# Patient Record
Sex: Female | Born: 1960
Health system: Southern US, Community
[De-identification: ages and names within clinical notes are randomized; demographics above are authoritative.]

## PROBLEM LIST (undated history)

## (undated) DIAGNOSIS — T4145XA Adverse effect of unspecified anesthetic, initial encounter: Secondary | ICD-10-CM

## (undated) DIAGNOSIS — T8859XA Other complications of anesthesia, initial encounter: Secondary | ICD-10-CM

## (undated) DIAGNOSIS — K222 Esophageal obstruction: Secondary | ICD-10-CM

## (undated) DIAGNOSIS — K429 Umbilical hernia without obstruction or gangrene: Secondary | ICD-10-CM

## (undated) DIAGNOSIS — N938 Other specified abnormal uterine and vaginal bleeding: Secondary | ICD-10-CM

## (undated) DIAGNOSIS — Z87440 Personal history of urinary (tract) infections: Secondary | ICD-10-CM

## (undated) DIAGNOSIS — K635 Polyp of colon: Secondary | ICD-10-CM

## (undated) DIAGNOSIS — G4733 Obstructive sleep apnea (adult) (pediatric): Secondary | ICD-10-CM

## (undated) DIAGNOSIS — O039 Complete or unspecified spontaneous abortion without complication: Secondary | ICD-10-CM

## (undated) DIAGNOSIS — M810 Age-related osteoporosis without current pathological fracture: Secondary | ICD-10-CM

## (undated) DIAGNOSIS — R000 Tachycardia, unspecified: Secondary | ICD-10-CM

## (undated) DIAGNOSIS — Q283 Other malformations of cerebral vessels: Secondary | ICD-10-CM

## (undated) DIAGNOSIS — D1800 Hemangioma unspecified site: Secondary | ICD-10-CM

## (undated) DIAGNOSIS — N840 Polyp of corpus uteri: Secondary | ICD-10-CM

## (undated) DIAGNOSIS — K5900 Constipation, unspecified: Secondary | ICD-10-CM

## (undated) DIAGNOSIS — Z9989 Dependence on other enabling machines and devices: Secondary | ICD-10-CM

## (undated) DIAGNOSIS — R609 Edema, unspecified: Secondary | ICD-10-CM

## (undated) DIAGNOSIS — M461 Sacroiliitis, not elsewhere classified: Secondary | ICD-10-CM

## (undated) DIAGNOSIS — J302 Other seasonal allergic rhinitis: Secondary | ICD-10-CM

## (undated) DIAGNOSIS — C439 Malignant melanoma of skin, unspecified: Secondary | ICD-10-CM

## (undated) DIAGNOSIS — G43829 Menstrual migraine, not intractable, without status migrainosus: Secondary | ICD-10-CM

## (undated) DIAGNOSIS — M25473 Effusion, unspecified ankle: Secondary | ICD-10-CM

## (undated) DIAGNOSIS — K219 Gastro-esophageal reflux disease without esophagitis: Secondary | ICD-10-CM

## (undated) HISTORY — DX: Other specified abnormal uterine and vaginal bleeding: N93.8

## (undated) HISTORY — PX: HERNIA REPAIR: SHX51

## (undated) HISTORY — DX: Polyp of colon: K63.5

## (undated) HISTORY — DX: Polyp of corpus uteri: N84.0

## (undated) HISTORY — DX: Hemangioma unspecified site: D18.00

## (undated) HISTORY — PX: OTHER SURGICAL HISTORY: SHX169

## (undated) HISTORY — DX: Personal history of urinary (tract) infections: Z87.440

## (undated) HISTORY — PX: EXPLORATORY LAPAROTOMY: SUR591

## (undated) HISTORY — DX: Umbilical hernia without obstruction or gangrene: K42.9

## (undated) HISTORY — DX: Dependence on other enabling machines and devices: Z99.89

## (undated) HISTORY — PX: TONSILLECTOMY: SUR1361

## (undated) HISTORY — PX: DILATION AND CURETTAGE OF UTERUS: SHX78

## (undated) HISTORY — DX: Esophageal obstruction: K22.2

## (undated) HISTORY — DX: Age-related osteoporosis without current pathological fracture: M81.0

## (undated) HISTORY — DX: Edema, unspecified: R60.9

## (undated) HISTORY — DX: Menstrual migraine, not intractable, without status migrainosus: G43.829

## (undated) HISTORY — DX: Obstructive sleep apnea (adult) (pediatric): G47.33

## (undated) HISTORY — DX: Malignant melanoma of skin, unspecified: C43.9

## (undated) HISTORY — DX: Other malformations of cerebral vessels: Q28.3

## (undated) HISTORY — DX: Constipation, unspecified: K59.00

## (undated) HISTORY — DX: Tachycardia, unspecified: R00.0

## (undated) HISTORY — DX: Other seasonal allergic rhinitis: J30.2

---

## 1997-07-11 ENCOUNTER — Ambulatory Visit (HOSPITAL_COMMUNITY): Admission: RE | Admit: 1997-07-11 | Discharge: 1997-07-11 | Payer: Self-pay | Admitting: Obstetrics and Gynecology

## 1997-12-06 ENCOUNTER — Inpatient Hospital Stay (HOSPITAL_COMMUNITY): Admission: AD | Admit: 1997-12-06 | Discharge: 1997-12-08 | Payer: Self-pay | Admitting: Obstetrics and Gynecology

## 1998-06-02 ENCOUNTER — Other Ambulatory Visit: Admission: RE | Admit: 1998-06-02 | Discharge: 1998-06-02 | Payer: Self-pay | Admitting: Obstetrics and Gynecology

## 1999-01-25 ENCOUNTER — Ambulatory Visit (HOSPITAL_COMMUNITY): Admission: RE | Admit: 1999-01-25 | Discharge: 1999-01-25 | Payer: Self-pay | Admitting: Gastroenterology

## 1999-01-25 ENCOUNTER — Encounter: Payer: Self-pay | Admitting: Gastroenterology

## 1999-06-26 ENCOUNTER — Other Ambulatory Visit: Admission: RE | Admit: 1999-06-26 | Discharge: 1999-06-26 | Payer: Self-pay | Admitting: Obstetrics and Gynecology

## 2000-09-29 ENCOUNTER — Other Ambulatory Visit: Admission: RE | Admit: 2000-09-29 | Discharge: 2000-09-29 | Payer: Self-pay | Admitting: Obstetrics and Gynecology

## 2001-10-26 ENCOUNTER — Encounter (INDEPENDENT_AMBULATORY_CARE_PROVIDER_SITE_OTHER): Payer: Self-pay | Admitting: *Deleted

## 2001-10-26 ENCOUNTER — Ambulatory Visit (HOSPITAL_COMMUNITY): Admission: RE | Admit: 2001-10-26 | Discharge: 2001-10-26 | Payer: Self-pay | Admitting: Gastroenterology

## 2001-11-18 ENCOUNTER — Other Ambulatory Visit: Admission: RE | Admit: 2001-11-18 | Discharge: 2001-11-18 | Payer: Self-pay | Admitting: Obstetrics and Gynecology

## 2001-12-21 ENCOUNTER — Encounter: Payer: Self-pay | Admitting: Obstetrics and Gynecology

## 2001-12-21 ENCOUNTER — Ambulatory Visit (HOSPITAL_COMMUNITY): Admission: RE | Admit: 2001-12-21 | Discharge: 2001-12-21 | Payer: Self-pay | Admitting: Obstetrics and Gynecology

## 2002-04-20 ENCOUNTER — Emergency Department (HOSPITAL_COMMUNITY): Admission: EM | Admit: 2002-04-20 | Discharge: 2002-04-20 | Payer: Self-pay | Admitting: Emergency Medicine

## 2002-04-20 ENCOUNTER — Encounter: Payer: Self-pay | Admitting: Emergency Medicine

## 2003-01-06 ENCOUNTER — Other Ambulatory Visit: Admission: RE | Admit: 2003-01-06 | Discharge: 2003-01-06 | Payer: Self-pay | Admitting: Obstetrics and Gynecology

## 2003-06-29 ENCOUNTER — Ambulatory Visit (HOSPITAL_COMMUNITY): Admission: RE | Admit: 2003-06-29 | Discharge: 2003-06-29 | Payer: Self-pay | Admitting: Obstetrics and Gynecology

## 2003-08-24 ENCOUNTER — Ambulatory Visit (HOSPITAL_COMMUNITY): Admission: RE | Admit: 2003-08-24 | Discharge: 2003-08-24 | Payer: Self-pay | Admitting: Orthopedic Surgery

## 2004-02-19 DIAGNOSIS — C439 Malignant melanoma of skin, unspecified: Secondary | ICD-10-CM

## 2004-02-19 HISTORY — PX: MELANOMA EXCISION: SHX5266

## 2004-02-19 HISTORY — DX: Malignant melanoma of skin, unspecified: C43.9

## 2004-02-23 ENCOUNTER — Other Ambulatory Visit: Admission: RE | Admit: 2004-02-23 | Discharge: 2004-02-23 | Payer: Self-pay | Admitting: Obstetrics and Gynecology

## 2004-11-22 ENCOUNTER — Ambulatory Visit: Payer: Self-pay | Admitting: Cardiology

## 2005-02-15 ENCOUNTER — Ambulatory Visit (HOSPITAL_COMMUNITY): Admission: RE | Admit: 2005-02-15 | Discharge: 2005-02-15 | Payer: Self-pay | Admitting: Gastroenterology

## 2005-06-19 DIAGNOSIS — Z87898 Personal history of other specified conditions: Secondary | ICD-10-CM | POA: Insufficient documentation

## 2005-09-19 ENCOUNTER — Other Ambulatory Visit: Admission: RE | Admit: 2005-09-19 | Discharge: 2005-09-19 | Payer: Self-pay | Admitting: Obstetrics and Gynecology

## 2005-11-01 ENCOUNTER — Ambulatory Visit (HOSPITAL_COMMUNITY): Admission: RE | Admit: 2005-11-01 | Discharge: 2005-11-01 | Payer: Self-pay | Admitting: Obstetrics and Gynecology

## 2006-04-30 ENCOUNTER — Emergency Department (HOSPITAL_COMMUNITY): Admission: EM | Admit: 2006-04-30 | Discharge: 2006-04-30 | Payer: Self-pay | Admitting: Family Medicine

## 2006-12-08 ENCOUNTER — Ambulatory Visit (HOSPITAL_COMMUNITY): Admission: RE | Admit: 2006-12-08 | Discharge: 2006-12-08 | Payer: Self-pay | Admitting: Obstetrics and Gynecology

## 2007-02-26 ENCOUNTER — Ambulatory Visit (HOSPITAL_BASED_OUTPATIENT_CLINIC_OR_DEPARTMENT_OTHER): Admission: RE | Admit: 2007-02-26 | Discharge: 2007-02-26 | Payer: Self-pay | Admitting: Surgery

## 2007-02-26 ENCOUNTER — Encounter (INDEPENDENT_AMBULATORY_CARE_PROVIDER_SITE_OTHER): Payer: Self-pay | Admitting: Surgery

## 2008-01-04 ENCOUNTER — Ambulatory Visit (HOSPITAL_COMMUNITY): Admission: RE | Admit: 2008-01-04 | Discharge: 2008-01-04 | Payer: Self-pay | Admitting: Obstetrics and Gynecology

## 2008-12-31 ENCOUNTER — Ambulatory Visit: Payer: Self-pay | Admitting: Family Medicine

## 2008-12-31 DIAGNOSIS — J069 Acute upper respiratory infection, unspecified: Secondary | ICD-10-CM | POA: Insufficient documentation

## 2009-02-27 ENCOUNTER — Ambulatory Visit (HOSPITAL_COMMUNITY): Admission: RE | Admit: 2009-02-27 | Discharge: 2009-02-27 | Payer: Self-pay | Admitting: Obstetrics and Gynecology

## 2009-05-24 ENCOUNTER — Ambulatory Visit (HOSPITAL_COMMUNITY): Admission: RE | Admit: 2009-05-24 | Discharge: 2009-05-24 | Payer: Self-pay | Admitting: Gastroenterology

## 2010-01-23 ENCOUNTER — Ambulatory Visit (HOSPITAL_COMMUNITY)
Admission: RE | Admit: 2010-01-23 | Discharge: 2010-01-23 | Payer: Self-pay | Source: Home / Self Care | Admitting: Gastroenterology

## 2010-02-18 HISTORY — PX: COLONOSCOPY: SHX174

## 2010-07-03 NOTE — Op Note (Signed)
NAMECHANA, Sharp               ACCOUNT NO.:  [id number]   MEDICAL RECORD NO.:  [id number]          PATIENT TYPE:  AMB   LOCATION:  [facility]                         FACILITY:  [facility]   PHYSICIAN:  Sandria Bales. Ezzard Standing, M.D.  DATE OF BIRTH:  05/29/1960   DATE OF PROCEDURE:  02/26/2007  DATE OF DISCHARGE:                               OPERATIVE REPORT   PREOPERATIVE DIAGNOSIS:  Left inguinal hernia.   POSTOPERATIVE DIAGNOSIS:  Left indirect inguinal hernia.   PROCEDURE:  Open left inguinal hernia repair with mesh.   SURGEON:  Sandria Bales. Ezzard Standing, M.D.   ASSISTANT:  None.   ANESTHESIA:  General endotracheal with 30 mL of 0.25% Marcaine.   COMPLICATIONS:  None.   INDICATIONS FOR PROCEDURE:  The patient is a 50 year old white female  who is a patient of Dr. 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  She is now interested in  having this hernia repaired.   I discussed with her the indications and potential complications of  hernia repair. Potential complications include but are not limited to  bleeding, infection, nerve injury, and recurrence of the hernia.   DESCRIPTION OF PROCEDURE:  I had marked the patient preoperatively.  The  patient had a general endotracheal anesthetic and was  a timeout was  held identifying the patient and the procedure.   She was given 1 gram of Ancef at the initiation of the procedure.  Her  left groin was shaved, prepped with Betadine solution, and sterilely  draped.   I made an incision in the left groin cutting down to the external  oblique fascia which I opened.  She had a moderate size left inguinal  hernia which protruded about 5-6 cm.  I took down the round ligament.  I  dissected off the sac which I opened and introduced a finger into the  peritoneal cavity.  She had no other mass or lesion palpable.  I then  ligated the sac with a 0 chromic  suture.  I ligated a round ligament at  the internal ring opening with a 0 chromic suture.  I then used a piece  of Bard mesh which was 3 x 6 inches cut down to maybe about 2 x 4 inches  in size and I sewed this in place to cover the inguinal floor.  I used 0  Novafil sutures for the repair.  I sewed it medial to the pubic tubercle  inferiorly to Cooper's ligament/the ilioinguinal track superiorly to  transversalis fascia and it covered the internal ring and inguinal floor  well.   I then irrigated the wound.  I used 30 mL of 0.25% Marcaine to  infiltrate the fascial space, the subcutaneous spaces.  I then closed  the external oblique fascia with interrupted 3-0 Vicryl suture, the skin  with 5-0 Vicryl suture.  I painted  the wound with tincture Benzoin and Steri-Strips.  The patient tolerated  the procedure well.  Needle, sponge, and instrument counts correct at  the end  of the case.  She is going home today.  She will see me back in  two weeks for follow-up.      Sandria Bales. Ezzard Standing, M.D.  Electronically Signed     DHN/MEDQ  D:  02/26/2007  T:  02/26/2007  Job:  [id number]   cc:   Hal Morales, M.D.  Fax: [phone]

## 2010-07-06 NOTE — Op Note (Signed)
Jennifer Sharp, SZCZYGIEL NO.:  [id number]   MEDICAL RECORD NO.:  [id number]                   PATIENT TYPE:  AMB   LOCATION:  ENDO                                 FACILITY:  [facility]   PHYSICIAN:  Florencia Reasons, M.D.             DATE OF BIRTH:  04/30/1960   DATE OF PROCEDURE:  10/26/2001  DATE OF DISCHARGE:                                 OPERATIVE REPORT   PROCEDURE PERFORMED:  Colonoscopy with polypectomy.   ENDOSCOPIST:  Florencia Reasons, M.D.   INDICATIONS FOR PROCEDURE:  Strong family history of colon cancer in the  patient's father as well as numerous relatives of the patient's father.   FINDINGS:  Several small polyps removed from the proximal colon.   DESCRIPTION OF PROCEDURE:  The nature, purpose and risks of the procedure  had been discussed with the patient, who provided written consent.  The  Olympus adjustable tension pediatric video colonoscope was used for this  procedure.  There was a fair amount of looping and she might do better with  the adult scope on future occasions.   Sedation was fentanyl 100 mcg and Versed 12 mg IV without arrhythmias or  desaturation.  The cecum was identified by typical cecal appearance and the  absence of further lumen and pullback was then performed.  The quality of  the prep was very good and it is felt that all areas were well seen.   There was a semipedunculated polyp on the fold a short distance above the  cecum.  Its base was injected with epinephrine to elevate it and to assist  with hemostasis and it was snared off using minimal cautery.  The polyp  fragments were retrieved both by suctioning through the scope and also by  finding a loose piece of polyp tissue subsequently in the more distal  section of the colon which was also able to be retrieved for histologic  analysis by grabbing it with a biopsy forceps.   In the proximal colon there were also two areas of small sessile polyps  which  were biopsied.  No large polyps, cancer, colitis, vascular  malformations or diverticulosis were noted.  Retroflexion in the rectum as  well as reinspection of the rectosigmoid disclosed no additional findings.   The patient tolerated the procedure well and there were no apparent  complications.   IMPRESSION:  Several small colon polyps removed from the proximal colon as  described above.  Strong family history of colon cancer.   PLAN:  Await pathology on polyps.                                                 Florencia Reasons, M.D.   RVB/MEDQ  D:  10/26/2001  T:  10/26/2001  Job:  [id number]

## 2010-07-06 NOTE — Op Note (Signed)
NAMESHENNA, BRISSETTE NO.:  [id number]   MEDICAL RECORD NO.:  [id number]          PATIENT TYPE:  AMB   LOCATION:  ENDO                         FACILITY:  [facility]   PHYSICIAN:  Bernette Redbird, M.D.   DATE OF BIRTH:  07-30-1960   DATE OF PROCEDURE:  02/15/2005  DATE OF DISCHARGE:                                 OPERATIVE REPORT   PROCEDURE:  Colonoscopy.   INDICATION:  A 50 year old with very strong family history of colon cancer  in her paternal grandmother, her father and two paternal on pulse. The  patient herself had her initial screening colonoscopy 3 years ago at which  time two small adenomatous polyps were removed.   FINDINGS:  Normal exam to the terminal ileum.   PROCEDURE:  The nature, purpose, risks of the procedure were familiar to the  patient from prior examination and she provided written consent. Sedation  was fentanyl 100 mcg and Versed 10 mg IV without arrhythmias or  desaturation. Based on previous experience, we knew there would be quite a  bit of looping so we used the adjustable tension adult video colonoscope.  Despite this, looping was still somewhat of a problem but it was overcome by  having the patient in the supine position from the early part of the exam  and using sustained low abdominal pressure. We thus advanced fairly easily  around the colon to the terminal ileum which had normal appearance and pull  back was then performed. The quality of prep was excellent and it is felt  that all areas were well seen.   This was a normal examination. No polyps were seen this time. There was no  evidence of cancer, colitis, vascular malformations or diverticulosis.  Retroflexion in the rectum and reinspection of the rectum were normal. No  biopsies were obtained. The patient tolerated the procedure well and there  no apparent complications.   IMPRESSION:  Normal surveillance colonoscopy in a patient with prior history  of colon adenomatous  colonic polyps and a strong family history of colon  cancer (V1 2.72).   PLAN:  Repeat colonoscopy in 4 years. The shorter interval than the standard  five years and is because of strong family history.           ______________________________  Bernette Redbird, M.D.     RB/MEDQ  D:  02/15/2005  T:  02/15/2005  Job:  [id number]

## 2010-07-24 ENCOUNTER — Encounter: Payer: Self-pay | Admitting: Family Medicine

## 2010-07-24 ENCOUNTER — Ambulatory Visit (INDEPENDENT_AMBULATORY_CARE_PROVIDER_SITE_OTHER): Payer: 59 | Admitting: Family Medicine

## 2010-07-24 DIAGNOSIS — IMO0001 Reserved for inherently not codable concepts without codable children: Secondary | ICD-10-CM

## 2010-07-24 DIAGNOSIS — G43829 Menstrual migraine, not intractable, without status migrainosus: Secondary | ICD-10-CM | POA: Insufficient documentation

## 2010-07-24 DIAGNOSIS — Q391 Atresia of esophagus with tracheo-esophageal fistula: Secondary | ICD-10-CM

## 2010-07-24 DIAGNOSIS — R609 Edema, unspecified: Secondary | ICD-10-CM | POA: Insufficient documentation

## 2010-07-24 DIAGNOSIS — K429 Umbilical hernia without obstruction or gangrene: Secondary | ICD-10-CM

## 2010-07-24 DIAGNOSIS — R5383 Other fatigue: Secondary | ICD-10-CM

## 2010-07-24 DIAGNOSIS — K222 Esophageal obstruction: Secondary | ICD-10-CM

## 2010-07-24 DIAGNOSIS — R252 Cramp and spasm: Secondary | ICD-10-CM

## 2010-07-24 DIAGNOSIS — K219 Gastro-esophageal reflux disease without esophagitis: Secondary | ICD-10-CM

## 2010-07-24 DIAGNOSIS — Z87440 Personal history of urinary (tract) infections: Secondary | ICD-10-CM

## 2010-07-24 DIAGNOSIS — E663 Overweight: Secondary | ICD-10-CM

## 2010-07-24 DIAGNOSIS — C439 Malignant melanoma of skin, unspecified: Secondary | ICD-10-CM | POA: Insufficient documentation

## 2010-07-24 DIAGNOSIS — Q393 Congenital stenosis and stricture of esophagus: Secondary | ICD-10-CM

## 2010-07-24 DIAGNOSIS — R5381 Other malaise: Secondary | ICD-10-CM

## 2010-07-24 DIAGNOSIS — D126 Benign neoplasm of colon, unspecified: Secondary | ICD-10-CM

## 2010-07-24 DIAGNOSIS — K635 Polyp of colon: Secondary | ICD-10-CM

## 2010-07-24 DIAGNOSIS — E785 Hyperlipidemia, unspecified: Secondary | ICD-10-CM

## 2010-07-24 LAB — MAGNESIUM: Magnesium: 2.2 mg/dL (ref 1.5–2.5)

## 2010-07-24 MED ORDER — RANITIDINE HCL 300 MG PO TABS: 300.0000 mg | ORAL_TABLET | Freq: Every day | ORAL | Status: DC

## 2010-07-24 NOTE — Patient Instructions (Signed)
Heartburn Heartburn is a painful, burning sensation in the chest. It may feel worse in certain positions, such as lying down or bending over. It is caused by stomach acid backing up into the tube that carries food from the mouth down to the stomach (lower esophagus).  CAUSES A number of conditions can cause or worsen heartburn, including:   Pregnancy.   Being overweight (obesity).   A condition called hiatal hernia, in which part or all of the stomach is moved up into the chest through a weakness in the diaphragm muscle.   Alcohol.   Exercise.   Eating just before going to bed.   Overeating.   Medications, including:   Nonsteroidal anti-inflammatory drugs, such as ibuprofen and naproxen.   Aspirin.   Some blood pressure medicines, including beta-blockers, calcium channel blockers, and alpha-blockers.   Nitrates (used to treat angina).   The asthma medication Theophylline.   Certain sedative drugs.   Heartburn may be worse after eating certain foods. These heartburn-causing foods are different for different people, but may include:   Peppers.   Chocolate.   Coffee.   High-fat foods, including fried foods.   Spicy foods.   Garlic, onions.   Citrus fruits, including oranges, grapefruit, lemons and limes.   Food containing tomatoes or tomato products.   Mint.   Carbonated beverages.   Vinegar.  SYMPTOMS  Symptoms may last for a few minutes or a few hours, and can include:  Burning pain in the chest or lower throat.   Bitter taste in the mouth.   Coughing.  DIAGNOSIS If the usual treatments for heartburn do not improve your symptoms, then tests may be done to see if there is another condition present. Possible tests may include:  X-rays.   Endoscopy. This is when a tube with a light and a camera on the end is used to examine the esophagus and the stomach.   Blood, breath, or stool tests may be used to check for bacteria that cause ulcers.   TREATMENT There are a number of non-prescription medicines used to treat heartburn, including:  Antacids.   Acid reducers (also called H-2 blockers).   Proton-pump inhibitors.  HOME CARE INSTRUCTIONS  Raise the head of your bed by putting blocks under the legs.   Eat 2-3 hours before going to bed.   Stop smoking.   Try to reach and maintain a healthy weight.   Do not eat just a few very large meals. Instead, eat many smaller meals throughout the day.   Try to identify foods and beverages that make your symptoms worse, and avoid these.   Avoid tight clothing.   Do not exercise right after eating.  SEEK IMMEDIATE MEDICAL CARE IF YOU:  Have severe chest pain that goes down your arm, or into your jaw or neck.   Feel sweaty, dizzy, or lightheaded.   Are short of breath.   Throw up (vomit) blood.   Have difficulty or pain with swallowing.   Have bloody or black, tarry stools.   Have bouts of heartburn more than three times a week for more than two weeks.  Document Released: 06/23/2008 Document Re-Released: 05/01/2009 [facility] Patient Information 2011 [place], [place].  You will hear about 2D echo in next 2 days  Start a fish oil cap daily (or Flaxseed oil cap) 1000 to 1200mg  cap  Start Benefiber 2 tsp of powder in food or drink daily 64 oz of clear fluids daily 7-8 hour of sleep daily Start a  probiotic such as Align caps daily  Cleanse irritated skin with [employer] Astringent daily prn Try Hyland's night time leg cramp meds prn

## 2010-07-25 ENCOUNTER — Encounter: Payer: Self-pay | Admitting: Family Medicine

## 2010-07-25 DIAGNOSIS — K429 Umbilical hernia without obstruction or gangrene: Secondary | ICD-10-CM

## 2010-07-25 DIAGNOSIS — E663 Overweight: Secondary | ICD-10-CM | POA: Insufficient documentation

## 2010-07-25 DIAGNOSIS — K635 Polyp of colon: Secondary | ICD-10-CM

## 2010-07-25 DIAGNOSIS — Z87440 Personal history of urinary (tract) infections: Secondary | ICD-10-CM

## 2010-07-25 DIAGNOSIS — K219 Gastro-esophageal reflux disease without esophagitis: Secondary | ICD-10-CM | POA: Insufficient documentation

## 2010-07-25 DIAGNOSIS — K222 Esophageal obstruction: Secondary | ICD-10-CM | POA: Insufficient documentation

## 2010-07-25 DIAGNOSIS — R5383 Other fatigue: Secondary | ICD-10-CM | POA: Insufficient documentation

## 2010-07-25 HISTORY — DX: Gastro-esophageal reflux disease without esophagitis: K21.9

## 2010-07-25 HISTORY — DX: Polyp of colon: K63.5

## 2010-07-25 HISTORY — DX: Personal history of urinary (tract) infections: Z87.440

## 2010-07-25 HISTORY — DX: Umbilical hernia without obstruction or gangrene: K42.9

## 2010-07-25 NOTE — Assessment & Plan Note (Signed)
She is on her feet as much as 14 hours a day. She is encouraged to elevate feet above heart whenever possible, minimize sodium and try Jobst knee hi compression hose, light weight, on in am off in pm. Due to her associatedd increase in SOB will also order a 2 D echo to rule out any cardiac cause

## 2010-07-25 NOTE — Assessment & Plan Note (Signed)
Patient with family history of colon cancer should. As a result she should do colonoscopies at age 50. Her first colonoscopy did show several polyps which were removed and were noncancerous. She then had a repeat colonoscopy in 3 years which was clear. In that she her she had a colonoscopy which was clear and she is now on a five-year cycle and following closely with gastroenterology Dr. Mardee Postin

## 2010-07-25 NOTE — Progress Notes (Signed)
Jennifer Sharp [id number] 01/24/1961 07/25/2010      Progress Note New Patient  Subjective  Chief Complaint  Chief Complaint  Patient presents with  . Establish Care    new patient    HPI  Patient is a 50 year old Caucasian female who is in today to establish primary care. 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 She reports otherwise been in relatively good health. She's not had any recent febrile illness, chest pain, palpitations. She does note significant pedal edema has occurred last couple months with being on her feet 14 hours a day. She also notes increasing shortness of breath with exertion especially noted when going up stairs. She started colonoscopies down to a family history of colon cancer. Her first colonoscopy had some problems since then she's been clear and she is now on Friday her cycles. She sees Dr. Pennie Rushing for her GYN care and has undergone colposcopies in the past but is doing well at the present time. She did have some lab work done there which had some mild abnormalities they thought due to inadequate handling of the sample and they asked her to start with her primary care physician she is here today to begin care. She does have a long history of reflux and Schatzki's ring has controlled that with Nexium but has been unable to decrease her dosing. When she tries her symptoms recur. She did struggle with premenstrual headaches and then developed hot flashes in the last couple of years. Was started on Latera and the hot flashes improved although she does still occasionally get Migraine Headaches prior to her menses onset. Did struggle with 4 miscarriages and difficulties with fertility in the past. Past Medical History  Diagnosis Date  . Allergy      seasonal- occasionally  . Melanoma 2006    on right arm  . Melanoma   . Hyperlipidemia   . Premenstrual migraine   . Edema   . Fatigue 07/25/2010  . Overweight 07/25/2010  . Reflux 07/25/2010  . Colon polyps 07/25/2010  . History of recurrent UTIs 07/25/2010    Past Surgical History  Procedure Date  . Oral surgeries 70's and 80's  . Dilation and curettage of uterus [phone]  . Melanoma spot on right arm removed 2006  . Tonsillectomy     childhood    Family History  Problem Relation Age of Onset  . Hyperlipidemia Mother   . Hypertension Mother   . Cancer Father 49    ish, colon- remission/ skin cancer  . Other Brother     stent placed  . Hypertension Brother   . Hyperlipidemia Brother   . Other Daughter     heart ablation/ heart arrythmia  . ADD / ADHD Daughter     ADD  . Heart disease Daughter 15    tachyarrythmia, scheduled for ablation   . ADD / ADHD Son     ADD  . Cancer Paternal Uncle     colon  . Diabetes Maternal Grandmother   . Stroke Maternal Grandmother   . Hypertension Maternal Grandmother   . Alzheimer's disease Maternal Grandmother   . Diabetes Maternal Grandfather   . Other Maternal Grandfather     cardiac disease  . Cancer Paternal Grandmother  colon  . Allergy (severe) Brother     smoker  . Other Brother     respiratory problems    History   Social History  . Marital Status: Married    Spouse Name: N/A    Number of Children: N/A  . Years of Education: N/A   Occupational History  . Not on file.   Social History Main Topics  . Smoking status: Never Smoker   . Smokeless tobacco: Never Used  . Alcohol Use: Yes     socially  . Drug Use: No  . Sexually Active: Yes -- Female partner(s)   Other Topics Concern  . Not on file   Social History Narrative  . No narrative on file    No current outpatient prescriptions on file prior to visit.    No Known Allergies  Review of Systems  Review of Systems  Constitutional: Negative  for fever, chills and malaise/fatigue.  HENT: Negative for hearing loss, nosebleeds and congestion.   Eyes: Negative for discharge.  Respiratory: Positive for shortness of breath. Negative for cough, sputum production and wheezing.        [With exertion Cardiovascular: Negative for chest pain, palpitations and leg swelling.  Gastrointestinal: Positive for heartburn. Negative for nausea, vomiting, abdominal pain, diarrhea, constipation and blood in stool.  Genitourinary: Negative for dysuria, urgency, frequency and hematuria.  Musculoskeletal: Negative for myalgias, back pain and falls.  Skin: Negative for rash.  Neurological: Negative for dizziness, tremors, sensory change, focal weakness, loss of consciousness, weakness and headaches.  Endo/Heme/Allergies: Negative for polydipsia. Does not bruise/bleed easily.  Psychiatric/Behavioral: Negative for depression and suicidal ideas. The patient is not nervous/anxious and does not have insomnia.     Objective  BP 139/83  Pulse 93  Temp(Src) 98.1 F (36.7 C) (Oral)  Ht 5' 6.5" (1.689 m)  Wt 167 lb 1.9 oz (75.805 kg)  BMI 26.57 kg/m2  SpO2 98%  Physical Exam  Physical Exam  Constitutional: She is oriented to person, place, and time and well-developed, well-nourished, and in no distress. No distress.  HENT:  Head: Normocephalic and atraumatic.  Right Ear: External ear normal.  Left Ear: External ear normal.  Nose: Nose normal.  Mouth/Throat: Oropharynx is clear and moist. No oropharyngeal exudate.  Eyes: Conjunctivae are normal. Pupils are equal, round, and reactive to light. Right eye exhibits no discharge. Left eye exhibits no discharge. No scleral icterus.  Neck: Normal range of motion. Neck supple. No thyromegaly present.  Cardiovascular: Normal rate, regular rhythm, normal heart sounds and intact distal pulses.   No murmur heard. Pulmonary/Chest: Effort normal and breath sounds normal. No respiratory distress. She has no wheezes.  She has no rales.  Abdominal: Soft. Bowel sounds are normal. She exhibits no distension and no mass. There is no tenderness.  Musculoskeletal: Normal range of motion. She exhibits no edema and no tenderness.  Lymphadenopathy:    She has no cervical adenopathy.  Neurological: She is alert and oriented to person, place, and time. She has normal reflexes. No cranial nerve deficit. Coordination normal.  Skin: Skin is warm and dry. No rash noted. She is not diaphoretic.  Psychiatric: Mood, memory and affect normal.       Assessment & Plan  Reflux Infrequent symptoms, may try to avoid offending foods, do not eat too close to bedtime and may use Ranitidine 300mg  daily prn reflux and alternate with PPI as needed to control symptoms. Is presently on Nexium and may try alternating this with Ranitidine, then  on a bad day can take both as indicated  Premenstrual migraine Is following with her OB/GYN Dr Pennie Rushing and using Lora Paula which she reports helps some may consider the 3 month OCPs in low doses if her symptoms worsen. If occurs reponds to Motrin prn  Overweight Encouraged 7-8 hours of sleep, increased activity, decreased po intake but small, frequent meals containing lean proteins and complex carbs  Melanoma Follows with [facility] due to this history and significant sun exposure when she was younger  Hyperlipidemia Notes a history of mild dyslipidemia, will monitor infrequently and as indicated. Avoid trans fats, consider a fish oil cap and fiber supplement daily  History of recurrent UTIs No recent difficulties, encouraged good hydration.  Fatigue Multifactorial. Related to stress of multitasking at home and at work out of necessity. Encouraged 7-8 hours of sleep, add exercise, eat small frequent meals with lean proteins and complex carbs.   Edema She is on her feet as much as 14 hours a day. She is encouraged to elevate feet above heart whenever possible, minimize sodium and try  Jobst knee hi compression hose, light weight, on in am off in pm. Due to her associatedd increase in SOB will also order a 2 D echo to rule out any cardiac cause  Colon polyps Patient with family history of colon cancer should. As a result she should do colonoscopies at age 61. Her first colonoscopy did show several polyps which were removed and were noncancerous. She then had a repeat colonoscopy in 3 years which was clear. In that she her she had a colonoscopy which was clear and she is now on a five-year cycle and following closely with gastroenterology Dr. Mardee Postin

## 2010-07-25 NOTE — Assessment & Plan Note (Signed)
Follows with [facility] due to this history and significant sun exposure when she was younger

## 2010-07-25 NOTE — Assessment & Plan Note (Signed)
Notes a history of mild dyslipidemia, will monitor infrequently and as indicated. Avoid trans fats, consider a fish oil cap and fiber supplement daily

## 2010-07-25 NOTE — Assessment & Plan Note (Addendum)
Is following with her OB/GYN Dr Pennie Rushing and using Lora Paula which she reports helps some may consider the 3 month OCPs in low doses if her symptoms worsen. If occurs reponds to Motrin prn

## 2010-07-25 NOTE — Assessment & Plan Note (Addendum)
Infrequent symptoms, may try to avoid offending foods, do not eat too close to bedtime and may use Ranitidine 300mg  daily prn reflux and alternate with PPI as needed to control symptoms. Is presently on Nexium and may try alternating this with Ranitidine, then on a bad day can take both as indicated

## 2010-07-25 NOTE — Assessment & Plan Note (Signed)
No recent difficulties, encouraged good hydration.

## 2010-07-25 NOTE — Assessment & Plan Note (Signed)
Multifactorial. Related to stress of multitasking at home and at work out of necessity. Encouraged 7-8 hours of sleep, add exercise, eat small frequent meals with lean proteins and complex carbs.

## 2010-07-25 NOTE — Assessment & Plan Note (Signed)
Encouraged 7-8 hours of sleep, increased activity, decreased po intake but small, frequent meals containing lean proteins and complex carbs

## 2010-08-06 ENCOUNTER — Other Ambulatory Visit (HOSPITAL_COMMUNITY): Payer: 59 | Admitting: Radiology

## 2010-08-07 ENCOUNTER — Ambulatory Visit (HOSPITAL_COMMUNITY): Payer: 59 | Attending: Family Medicine | Admitting: Radiology

## 2010-08-07 DIAGNOSIS — M7989 Other specified soft tissue disorders: Secondary | ICD-10-CM | POA: Insufficient documentation

## 2010-08-07 DIAGNOSIS — E785 Hyperlipidemia, unspecified: Secondary | ICD-10-CM | POA: Insufficient documentation

## 2010-08-07 DIAGNOSIS — R0602 Shortness of breath: Secondary | ICD-10-CM | POA: Insufficient documentation

## 2010-08-07 DIAGNOSIS — R609 Edema, unspecified: Secondary | ICD-10-CM

## 2010-11-07 LAB — POCT PREGNANCY, URINE: Preg Test, Ur: NEGATIVE

## 2010-12-27 ENCOUNTER — Other Ambulatory Visit: Payer: Self-pay | Admitting: Family Medicine

## 2010-12-27 MED ORDER — RANITIDINE HCL 300 MG PO TABS: 300.0000 mg | ORAL_TABLET | Freq: Every day | ORAL | Status: DC

## 2011-05-09 ENCOUNTER — Telehealth: Payer: Self-pay | Admitting: Family Medicine

## 2011-05-10 NOTE — Telephone Encounter (Signed)
Advised we have tried to draw labs at hospital before and was told we could not do that.  Pt will ask director of lab at [facility] if this can be done and how to enter.  She will call us back on Monday.

## 2011-05-16 NOTE — Telephone Encounter (Signed)
Pt states she talked to someone in the lab and they will draw her blood there? Pt would like her orders to be faxed to [phone] and would like a phone call when the orders have been faced to [facility].

## 2011-05-16 NOTE — Telephone Encounter (Signed)
I do not think she can do her labs at [facility], I think we will run in to the same problem we had at [facility], I know she could do them at [facility], I just need to know the day she plans to go so I can order them

## 2011-05-16 NOTE — Telephone Encounter (Signed)
The patient stated she had talked to the lab supervisor at [facility] and they will do her labs. Pt stated that Jennifer Sharp should do the labs for anyone to.

## 2011-05-20 ENCOUNTER — Other Ambulatory Visit: Payer: Self-pay | Admitting: Family Medicine

## 2011-05-20 ENCOUNTER — Other Ambulatory Visit: Payer: Self-pay

## 2011-05-20 DIAGNOSIS — N390 Urinary tract infection, site not specified: Secondary | ICD-10-CM

## 2011-05-20 DIAGNOSIS — E785 Hyperlipidemia, unspecified: Secondary | ICD-10-CM

## 2011-05-20 DIAGNOSIS — E663 Overweight: Secondary | ICD-10-CM

## 2011-05-20 NOTE — Telephone Encounter (Signed)
They can be put in for today and Jennifer Sharp at the [facility] lab asked that we fax the order over at [phone]

## 2011-05-20 NOTE — Telephone Encounter (Signed)
OK so I need to know what day and how to order them?

## 2011-05-31 ENCOUNTER — Other Ambulatory Visit: Payer: Self-pay | Admitting: Family Medicine

## 2011-05-31 ENCOUNTER — Other Ambulatory Visit: Payer: Self-pay

## 2011-05-31 DIAGNOSIS — E785 Hyperlipidemia, unspecified: Secondary | ICD-10-CM

## 2011-05-31 DIAGNOSIS — N390 Urinary tract infection, site not specified: Secondary | ICD-10-CM

## 2011-05-31 DIAGNOSIS — E663 Overweight: Secondary | ICD-10-CM

## 2011-06-01 LAB — HEPATIC FUNCTION PANEL
ALT: 10 U/L (ref 0–35)
AST: 12 U/L (ref 0–37)
Albumin: 3.9 g/dL (ref 3.5–5.2)
Alkaline Phosphatase: 46 U/L (ref 39–117)

## 2011-06-01 LAB — BASIC METABOLIC PANEL
BUN: 13 mg/dL (ref 6–23)
Calcium: 9.3 mg/dL (ref 8.4–10.5)
Chloride: 104 mEq/L (ref 96–112)
Creat: 0.86 mg/dL (ref 0.50–1.10)
Potassium: 4.1 mEq/L (ref 3.5–5.3)
Sodium: 139 mEq/L (ref 135–145)

## 2011-06-01 LAB — CBC
MCH: 30 pg (ref 26.0–34.0)
MCHC: 31.5 g/dL (ref 30.0–36.0)
MCV: 95.3 fL (ref 78.0–100.0)
RBC: 4.7 MIL/uL (ref 3.87–5.11)
WBC: 7.8 10*3/uL (ref 4.0–10.5)

## 2011-06-01 LAB — URINALYSIS, MICROSCOPIC ONLY
Bacteria, UA: NONE SEEN
Casts: NONE SEEN

## 2011-06-01 LAB — URINALYSIS, ROUTINE W REFLEX MICROSCOPIC
Bilirubin Urine: NEGATIVE
Glucose, UA: NEGATIVE mg/dL
Urobilinogen, UA: 0.2 mg/dL (ref 0.0–1.0)

## 2011-06-01 LAB — LIPID PANEL: VLDL: 25 mg/dL (ref 0–40)

## 2011-06-01 LAB — PHOSPHORUS: Phosphorus: 3.3 mg/dL (ref 2.3–4.6)

## 2011-06-01 LAB — TSH: TSH: 2.648 u[IU]/mL (ref 0.350–4.500)

## 2011-06-07 ENCOUNTER — Encounter: Payer: Self-pay | Admitting: Family Medicine

## 2011-06-07 ENCOUNTER — Ambulatory Visit (INDEPENDENT_AMBULATORY_CARE_PROVIDER_SITE_OTHER): Payer: 59 | Admitting: Family Medicine

## 2011-06-07 VITALS — BP 137/86 | HR 83 | Temp 97.9°F | Ht 66.5 in | Wt 175.8 lb

## 2011-06-07 DIAGNOSIS — R5383 Other fatigue: Secondary | ICD-10-CM

## 2011-06-07 DIAGNOSIS — D126 Benign neoplasm of colon, unspecified: Secondary | ICD-10-CM

## 2011-06-07 DIAGNOSIS — R0681 Apnea, not elsewhere classified: Secondary | ICD-10-CM

## 2011-06-07 DIAGNOSIS — R5381 Other malaise: Secondary | ICD-10-CM

## 2011-06-07 DIAGNOSIS — Z0000 Encounter for general adult medical examination without abnormal findings: Secondary | ICD-10-CM

## 2011-06-07 DIAGNOSIS — B372 Candidiasis of skin and nail: Secondary | ICD-10-CM

## 2011-06-07 DIAGNOSIS — E785 Hyperlipidemia, unspecified: Secondary | ICD-10-CM

## 2011-06-07 DIAGNOSIS — G4726 Circadian rhythm sleep disorder, shift work type: Secondary | ICD-10-CM

## 2011-06-07 DIAGNOSIS — B379 Candidiasis, unspecified: Secondary | ICD-10-CM

## 2011-06-07 DIAGNOSIS — K635 Polyp of colon: Secondary | ICD-10-CM

## 2011-06-07 DIAGNOSIS — Z87440 Personal history of urinary (tract) infections: Secondary | ICD-10-CM

## 2011-06-07 DIAGNOSIS — R252 Cramp and spasm: Secondary | ICD-10-CM

## 2011-06-07 DIAGNOSIS — R609 Edema, unspecified: Secondary | ICD-10-CM

## 2011-06-07 MED ORDER — METHYLPHENIDATE HCL 10 MG PO TABS: 10.0000 mg | ORAL_TABLET | Freq: Every day | ORAL | Status: DC

## 2011-06-07 MED ORDER — FLUCONAZOLE 150 MG PO TABS: ORAL_TABLET | ORAL | Status: DC

## 2011-06-07 MED ORDER — NYSTATIN 100000 UNIT/GM EX POWD: Freq: Four times a day (QID) | CUTANEOUS | Status: DC

## 2011-06-07 MED ORDER — ALIGN PO CAPS: 1.0000 | ORAL_CAPSULE | Freq: Every day | ORAL | Status: AC

## 2011-06-07 NOTE — Patient Instructions (Signed)

## 2011-06-09 ENCOUNTER — Encounter: Payer: Self-pay | Admitting: Family Medicine

## 2011-06-09 DIAGNOSIS — Z0000 Encounter for general adult medical examination without abnormal findings: Secondary | ICD-10-CM | POA: Insufficient documentation

## 2011-06-09 NOTE — Assessment & Plan Note (Signed)
Minimize sodium, continue compression hose, attempt weight, elevate feet above heart and consider diuretics if worsens

## 2011-06-09 NOTE — Progress Notes (Signed)
Patient ID: Jennifer Sharp, female   DOB: 01-04-1961, 51 y.o.   MRN: [id number] Jennifer Sharp [id number] 1960/04/17 06/09/2011      Progress Note New Patient  Subjective  Chief Complaint  Chief Complaint  Patient presents with  . Annual Exam    physical    HPI  Patient is a 51 year old Caucasian female who is in today for annual exam. She is noting as the weather has warmed up she's had increased rash under bilateral breasts. Has used nystatin powder in the past with some relief but is getting minimal relief this year. Cells complaining of increasing fatigue and has a long history of snoring. More recently her husband said he's actually seen or have apneic episodes. She struggled with persistent and debilitating fatigue as well. She had a colonoscopy last month which she says was normal she has started to exercise more and is pleased with that. He is having persistent pedal edema despite wearing compression hose on occasion. No chest pain, palpitations, shortness or breath, GI or GU for  Past Medical History  Diagnosis Date  . Allergy     seasonal- occasionally  . Melanoma 2006    on right arm  . Melanoma   . Hyperlipidemia   . Premenstrual migraine   . Edema   . Fatigue 07/25/2010  . Overweight 07/25/2010  . Reflux 07/25/2010  . Colon polyps 07/25/2010  . History of recurrent UTIs 07/25/2010  . Schatzki's ring 07/25/2010  . Umbilical hernia 07/25/2010  . Preventative health care 06/09/2011    Past Surgical History  Procedure Date  . Oral surgeries 70's and 80's  . Dilation and curettage of uterus [phone]  . Melanoma spot on right arm removed 2006  . Tonsillectomy     childhood  . Hernia repair     left inguinal    Family History  Problem Relation Age of Onset  . Hyperlipidemia Mother   . Hypertension Mother   . Cancer Father 78    ish, colon- remission/ skin cancer  . Other Brother     stent placed  . Hypertension Brother   . Hyperlipidemia Brother   . Other Daughter      heart ablation/ heart arrythmia  . ADD / ADHD Daughter     ADD  . Heart disease Daughter 15    tachyarrythmia, scheduled for ablation   . ADD / ADHD Son     ADD  . Cancer Paternal Uncle     colon  . Diabetes Maternal Grandmother   . Stroke Maternal Grandmother   . Hypertension Maternal Grandmother   . Alzheimer's disease Maternal Grandmother   . Diabetes Maternal Grandfather   . Other Maternal Grandfather     cardiac disease  . Cancer Paternal Grandmother     colon  . Allergy (severe) Brother     smoker  . Other Brother     respiratory problems    History   Social History  . Marital Status: Married    Spouse Name: N/A    Number of Children: N/A  . Years of Education: N/A   Occupational History  . Not on file.   Social History Main Topics  . Smoking status: Never Smoker   . Smokeless tobacco: Never Used  . Alcohol Use: Yes     socially  . Drug Use: No  . Sexually Active: Yes -- Female partner(s)   Other Topics Concern  . Not on file   Social History Narrative  . No  narrative on file    Current Outpatient Prescriptions on File Prior to Visit  Medication Sig Dispense Refill  . calcium carbonate (OS-CAL) 600 MG TABS Take 600 mg by mouth 2 (two) times daily with a meal.        . esomeprazole (NEXIUM) 40 MG capsule Take 40 mg by mouth daily before breakfast.        . levonorgestrel-ethinyl estradiol (AVIANE,ALESSE,LESSINA) 0.1-20 MG-MCG per tablet Take 1 tablet by mouth daily. Active pills only       . ranitidine (ZANTAC) 300 MG tablet Take 1 tablet (300 mg total) by mouth at bedtime.  30 tablet  0  . methylphenidate (RITALIN) 10 MG tablet Take 1 tablet (10 mg total) by mouth daily.  30 tablet  0    No Known Allergies  Review of Systems  Review of Systems  Constitutional: Positive for malaise/fatigue. Negative for fever and chills.  HENT: Negative for hearing loss, nosebleeds and congestion.   Eyes: Negative for discharge.  Respiratory: Negative for  cough, sputum production, shortness of breath and wheezing.   Cardiovascular: Negative for chest pain, palpitations and leg swelling.  Gastrointestinal: Negative for heartburn, nausea, vomiting, abdominal pain, diarrhea, constipation and blood in stool.  Genitourinary: Negative for dysuria, urgency, frequency and hematuria.  Musculoskeletal: Negative for myalgias, back pain and falls.  Skin: Negative for rash.  Neurological: Negative for dizziness, tremors, sensory change, focal weakness, loss of consciousness, weakness and headaches.  Endo/Heme/Allergies: Negative for polydipsia. Does not bruise/bleed easily.  Psychiatric/Behavioral: Negative for depression and suicidal ideas. The patient is not nervous/anxious and does not have insomnia.     Objective  BP 137/86  Pulse 83  Temp(Src) 97.9 F (36.6 C) (Temporal)  Ht 5' 6.5" (1.689 m)  Wt 175 lb 12.8 oz (79.742 kg)  BMI 27.95 kg/m2  SpO2 97%  Physical Exam  Physical Exam  Constitutional: She is oriented to person, place, and time and well-developed, well-nourished, and in no distress. No distress.  HENT:  Head: Normocephalic and atraumatic.  Right Ear: External ear normal.  Left Ear: External ear normal.  Nose: Nose normal.  Mouth/Throat: Oropharynx is clear and moist. No oropharyngeal exudate.  Eyes: Conjunctivae are normal. Pupils are equal, round, and reactive to light. Right eye exhibits no discharge. Left eye exhibits no discharge.  Neck: Neck supple. No thyromegaly present.  Cardiovascular: Normal rate, regular rhythm and normal heart sounds.   No murmur heard. Pulmonary/Chest: Effort normal and breath sounds normal. She has no wheezes.  Abdominal: Soft. Bowel sounds are normal. She exhibits no distension and no mass. There is no tenderness. There is no rebound.  Musculoskeletal: Normal range of motion. She exhibits no edema and no tenderness.  Lymphadenopathy:    She has no cervical adenopathy.  Neurological: She is  alert and oriented to person, place, and time.  Skin: Skin is warm and dry. Rash noted. She is not diaphoretic. There is erythema.       Skin under b/l breasts erythematous and edematous  Psychiatric: Memory, affect and judgment normal.       Assessment & Plan  Fatigue Snoring, witnessed apnea, weight gain, will order a sleep study  Edema Minimize sodium, continue compression hose, attempt weight, elevate feet above heart and consider diuretics if worsens  History of recurrent UTIs No recent difficulties  Colon polyps Secondary to strong family history will stay on 5 year cycles of colonoscopies  Hyperlipidemia Well controlled, no changes, avoid trans fats.  Preventative health care Consider  DASH diet and increased physical activity  Candidiasis of skin  Nystatin powder prn and Diflucan are prescribed, minimize simple carbs and start a probiotic

## 2011-06-09 NOTE — Assessment & Plan Note (Signed)
No recent difficulties 

## 2011-06-09 NOTE — Assessment & Plan Note (Signed)
Consider DASH diet and increased physical activity

## 2011-06-09 NOTE — Assessment & Plan Note (Signed)
Secondary to strong family history will stay on 5 year cycles of colonoscopies

## 2011-06-09 NOTE — Assessment & Plan Note (Signed)
Well controlled, no changes, avoid trans fats 

## 2011-06-09 NOTE — Assessment & Plan Note (Signed)
Snoring, witnessed apnea, weight gain, will order a sleep study

## 2011-06-10 ENCOUNTER — Encounter: Payer: Self-pay | Admitting: Family Medicine

## 2011-06-10 DIAGNOSIS — B372 Candidiasis of skin and nail: Secondary | ICD-10-CM | POA: Insufficient documentation

## 2011-06-10 NOTE — Assessment & Plan Note (Signed)
Nystatin powder prn and Diflucan are prescribed, minimize simple carbs and start a probiotic

## 2011-06-20 ENCOUNTER — Encounter: Payer: Self-pay | Admitting: Obstetrics and Gynecology

## 2011-06-20 ENCOUNTER — Ambulatory Visit: Payer: Self-pay | Admitting: Obstetrics and Gynecology

## 2011-06-20 ENCOUNTER — Ambulatory Visit (INDEPENDENT_AMBULATORY_CARE_PROVIDER_SITE_OTHER): Payer: 59 | Admitting: Obstetrics and Gynecology

## 2011-06-20 VITALS — BP 118/80 | HR 72 | Ht 66.25 in | Wt 175.0 lb

## 2011-06-20 DIAGNOSIS — G43829 Menstrual migraine, not intractable, without status migrainosus: Secondary | ICD-10-CM

## 2011-06-20 DIAGNOSIS — Z0001 Encounter for general adult medical examination with abnormal findings: Secondary | ICD-10-CM | POA: Insufficient documentation

## 2011-06-20 DIAGNOSIS — R232 Flushing: Secondary | ICD-10-CM | POA: Insufficient documentation

## 2011-06-20 DIAGNOSIS — Z8742 Personal history of other diseases of the female genital tract: Secondary | ICD-10-CM

## 2011-06-20 DIAGNOSIS — Z01419 Encounter for gynecological examination (general) (routine) without abnormal findings: Secondary | ICD-10-CM

## 2011-06-20 DIAGNOSIS — Z87898 Personal history of other specified conditions: Secondary | ICD-10-CM

## 2011-06-20 DIAGNOSIS — N951 Menopausal and female climacteric states: Secondary | ICD-10-CM

## 2011-06-20 MED ORDER — LEVONORGESTREL-ETHINYL ESTRAD 0.1-20 MG-MCG PO TABS: 1.0000 | ORAL_TABLET | Freq: Every day | ORAL | Status: DC

## 2011-06-20 NOTE — Progress Notes (Signed)
Last Pap: 07/02/2010 WNL: Yes Regular Periods:no  Monthly Breast exam:yes Tetanus<52yrs:yes Nl.Bladder Function:yes Daily BMs:no Healthy Diet:yes Calcium:yes Mammogram:yes 02/2009  Exercise:yes Seatbelt: yes Abuse at home: no Stressful work:yes Sigmoid-colonoscopy: 2011  Bone Density: No Loralyn [place]     Subjective:    Jennifer Sharp is a 51 y.o. female, who presents for an annual exam. New complaint of  Nightly sweats except during recent cruise.  occasiona daytime hotflashes.  No vaginal dryness.  Notes premenstrual headache if she misses even one day of her BCPs   History   Social History  . Marital Status: Married    Spouse Name: N/A    Number of Children: N/A  . Years of Education: N/A   Social History Main Topics  . Smoking status: Never Smoker   . Smokeless tobacco: Never Used  . Alcohol Use: Yes     socially  . Drug Use: No  . Sexually Active: Yes -- Female partner(s)   Other Topics Concern  . None   Social History Narrative  . None    Menstrual cycle:   LMP: No LMP recorded. Patient is not currently having periods (Reason: Oral contraceptives).Used in a continuous fashion to avoid headaches           Cycle: n/a  The following portions of the patient's history were reviewed and updated as appropriate: allergies, current medications, past family history, past medical history, past social history, past surgical history and problem list.  Review of Systems Pertinent items are noted in HPI. Breast:Negative for breast lump,nipple discharge or nipple retraction Gastrointestinal: Negative for abdominal pain, change in bowel habits or rectal bleeding Urinary:negative   Objective:    BP 118/80  Pulse 72  Ht 5' 6.25" (1.683 m)  Wt 175 lb (79.379 kg)  BMI 28.03 kg/m2    Weight:  Wt Readings from Last 1 Encounters:  06/20/11 175 lb (79.379 kg)          BMI: Body mass index is 28.03 kg/(m^2).  General Appearance: Alert, appropriate appearance for  age. No acute distress HEENT: Grossly normal Neck / Thyroid: Supple, no masses, nodes or enlargement Lungs: clear to auscultation bilaterally Back: No CVA tenderness Breast Exam: No masses or nodes.No dimpling, nipple retraction or discharge. Cardiovascular: Regular rate and rhythm. S1, S2, no murmur Gastrointestinal: Soft, non-tender, no masses or organomegaly Pelvic Exam: External genitalia: normal general appearance Vaginal: masses, Left lateral 15mm nontender cyst which is stable Cervix: normal appearance Adnexa: normal bimanual exam Uterus: normal single, nontender Rectovaginal: normal rectal, no masses Lymphatic Exam: Non-palpable nodes in neck, clavicular, axillary, or inguinal regions Skin: no rash or abnormalities Neurologic: Normal gait and speech, no tremor  Psychiatric: Alert and oriented, appropriate affect.   Wet Prep:not applicable Urinalysis:not applicable UPT: Not done   Assessment:    Menopause symptoms, mild Hx abnormal pap LGSIL , s/p CRYO 2007   Plan:    mammogram pap smear with reflex HPV return annually or prn Contraception:vasectomy Wants to continue Lutera continuous active BCPs.  At age 61, will offer estradiol and FSH and will consider changing to lower dose estrogen/progesterone combination vs discontinuing with the expectation that premenstrual headaches will improve postmenopausally.      Candice Tobey PMD

## 2011-06-21 ENCOUNTER — Ambulatory Visit (HOSPITAL_BASED_OUTPATIENT_CLINIC_OR_DEPARTMENT_OTHER): Payer: 59 | Attending: Family Medicine

## 2011-06-21 DIAGNOSIS — R0989 Other specified symptoms and signs involving the circulatory and respiratory systems: Secondary | ICD-10-CM | POA: Insufficient documentation

## 2011-06-21 DIAGNOSIS — G4733 Obstructive sleep apnea (adult) (pediatric): Secondary | ICD-10-CM | POA: Insufficient documentation

## 2011-06-21 DIAGNOSIS — R0609 Other forms of dyspnea: Secondary | ICD-10-CM | POA: Insufficient documentation

## 2011-06-24 LAB — PAP IG W/ RFLX HPV ASCU

## 2011-06-25 ENCOUNTER — Telehealth: Payer: Self-pay

## 2011-06-25 NOTE — Telephone Encounter (Signed)
FYI:Dr Maple Hudson will read Sleep Study on Saturday. Will fax over results on Monday. Patient states she quit breathing 20 times in a minute

## 2011-06-30 DIAGNOSIS — R0989 Other specified symptoms and signs involving the circulatory and respiratory systems: Secondary | ICD-10-CM

## 2011-06-30 DIAGNOSIS — G4733 Obstructive sleep apnea (adult) (pediatric): Secondary | ICD-10-CM

## 2011-06-30 DIAGNOSIS — R0609 Other forms of dyspnea: Secondary | ICD-10-CM

## 2011-07-01 ENCOUNTER — Telehealth: Payer: Self-pay

## 2011-07-01 NOTE — Telephone Encounter (Signed)
Did not receive the results yet, when I reviewed my papers at end of day, please see if you can find the results for review

## 2011-07-01 NOTE — Telephone Encounter (Signed)
Patient left a message stating that the sleep study results should of been faxed to MD this morning? Pt is wandering if MD got this and if so what should she do from here? Please advise? Pt call back # [phone]

## 2011-07-02 ENCOUNTER — Telehealth: Payer: Self-pay

## 2011-07-02 NOTE — Telephone Encounter (Signed)
Left a detailed message for patient to return our call and have the sleep study refaxed to [place]

## 2011-07-02 NOTE — Telephone Encounter (Signed)
Message copied by Court Joy on Tue Jul 02, 2011 10:47 AM ------      Message from: Danise Edge A      Created: Mon Jul 01, 2011  9:44 PM       Could not find note on sleep study at end of day. Please see if you can track down results

## 2011-07-03 ENCOUNTER — Telehealth: Payer: Self-pay

## 2011-07-03 NOTE — Telephone Encounter (Signed)
Per MD let patient know she has Moderate Obstructive Sleep Apnea. Patient would need a CPAP.   Left a detailed message and asked patient to return our call so we can put the order for the CPAP in if patient wants to proceed

## 2011-07-04 ENCOUNTER — Telehealth: Payer: Self-pay | Admitting: Family Medicine

## 2011-07-04 NOTE — Telephone Encounter (Signed)
Patient called back she would like to get a CPAP machine through

## 2011-07-04 NOTE — Telephone Encounter (Signed)
I'll contact [facility] to get the CPAP machine. Please put in an order in Epic. Thanks

## 2011-07-04 NOTE — Telephone Encounter (Signed)
Please put in an order for a CPAP machine. Thanks

## 2011-07-07 ENCOUNTER — Other Ambulatory Visit: Payer: Self-pay | Admitting: Family Medicine

## 2011-07-08 ENCOUNTER — Other Ambulatory Visit: Payer: Self-pay | Admitting: Family Medicine

## 2011-07-08 DIAGNOSIS — G4730 Sleep apnea, unspecified: Secondary | ICD-10-CM

## 2011-07-31 ENCOUNTER — Ambulatory Visit (HOSPITAL_BASED_OUTPATIENT_CLINIC_OR_DEPARTMENT_OTHER): Payer: 59 | Attending: Family Medicine | Admitting: Radiology

## 2011-07-31 VITALS — Ht 67.0 in | Wt 172.0 lb

## 2011-07-31 DIAGNOSIS — G4730 Sleep apnea, unspecified: Secondary | ICD-10-CM

## 2011-07-31 DIAGNOSIS — G4733 Obstructive sleep apnea (adult) (pediatric): Secondary | ICD-10-CM | POA: Insufficient documentation

## 2011-07-31 DIAGNOSIS — Z9989 Dependence on other enabling machines and devices: Secondary | ICD-10-CM

## 2011-08-01 DIAGNOSIS — G4733 Obstructive sleep apnea (adult) (pediatric): Secondary | ICD-10-CM

## 2011-08-01 NOTE — Procedures (Signed)
Jennifer Sharp, BONSIGNORE NO.:  [id number]  MEDICAL RECORD NO.:  [id number]          PATIENT TYPE:  OUT  LOCATION:  SLEEP CENTER                 FACILITY:  [facility]  PHYSICIAN:  Barbaraann Share, MD,FCCPDATE OF BIRTH:  11-08-61  DATE OF STUDY:  07/31/2011                           NOCTURNAL POLYSOMNOGRAM  REFERRING PHYSICIAN:  Danise Edge, MD  LOCATION:  Sleep lab.  REFERRING PHYSICIAN:  Danise Edge, MD  INDICATION FOR STUDY:  Hypersomnia with sleep apnea.  The patient has had a recent home sleep test which showed moderate obstructive sleep apnea with an AHI of 20 events per hour.  SLEEP ARCHITECTURE:  The patient had a total sleep time of 299 minutes with very little slow-wave sleep and decreased quantity of REM.  Sleep onset latency was normal at 21 minutes, and REM onset was normal at 101 minutes.  Sleep efficiency was moderately reduced at 80%.  RESPIRATORY DATA:  The patient underwent a CPAP titration study with a ResMed Mirage FX standard nasal mask.  She was started on a CPAP pressure at 5 cm of water and gradually increased in order to control both obstructive events and snoring.  At a final pressure of 8 cm, she was found to have good control of her sleep apnea and snoring even with supine REM.  OXYGEN DATA:  There was O2 desaturation as low as 91% with the patient's respiratory events.  CARDIAC DATA:  Rare PVC noted, but no clinically significant arrhythmias were seen.  MOVEMENTS/PARASOMNIA:  The patient had no significant leg jerks or other abnormal behaviors noted.  IMPRESSION/RECOMMENDATION: 1. Good control of previously documented obstructive sleep apnea with     a CPAP pressure of 8 cm of water, and delivered by a ResMed Mirage     FX standard nasal mask.  The patient should also be encouraged to     work     aggressively on weight loss. 2. Rare PVC noted, but no clinically significant arrhythmias were     seen.     Barbaraann Share,  MD,FCCP Diplomate, American Board of Sleep Medicine    [facility]/MEDQ  D:  08/01/2011 14:32:23  T:  08/01/2011 21:14:29  Job:  [id number]

## 2011-08-13 ENCOUNTER — Other Ambulatory Visit: Payer: Self-pay | Admitting: Family Medicine

## 2011-08-13 ENCOUNTER — Telehealth: Payer: Self-pay

## 2011-08-13 ENCOUNTER — Other Ambulatory Visit: Payer: Self-pay | Admitting: Obstetrics and Gynecology

## 2011-08-13 DIAGNOSIS — G4730 Sleep apnea, unspecified: Secondary | ICD-10-CM

## 2011-08-13 DIAGNOSIS — Z1231 Encounter for screening mammogram for malignant neoplasm of breast: Secondary | ICD-10-CM

## 2011-08-13 NOTE — Telephone Encounter (Signed)
Pt called stating that she had her sleep study done on the 14th and would like to have [facility] get started. Please advise?

## 2011-08-13 NOTE — Telephone Encounter (Signed)
Faxed 07/31/11 [facility] Sleep Study results to [facility]

## 2011-08-13 NOTE — Telephone Encounter (Signed)
cpap ordered through [facility]

## 2011-08-16 ENCOUNTER — Telehealth: Payer: Self-pay | Admitting: Family Medicine

## 2011-08-16 NOTE — Telephone Encounter (Signed)
Patient called back in about CPAP order at [facility]. I advised patient I would contact [facility] to check on the status. I spoke with Melissa at [facility] she will contact the patient to let her know the status.

## 2011-09-06 ENCOUNTER — Ambulatory Visit (HOSPITAL_COMMUNITY)
Admission: RE | Admit: 2011-09-06 | Discharge: 2011-09-06 | Disposition: A | Discharge status: Home / Self Care | Payer: 59 | Source: Ambulatory Visit | Attending: Obstetrics and Gynecology | Admitting: Obstetrics and Gynecology

## 2011-09-06 DIAGNOSIS — Z1231 Encounter for screening mammogram for malignant neoplasm of breast: Secondary | ICD-10-CM | POA: Insufficient documentation

## 2011-09-12 ENCOUNTER — Encounter: Payer: Self-pay | Admitting: Obstetrics and Gynecology

## 2011-10-02 ENCOUNTER — Ambulatory Visit (INDEPENDENT_AMBULATORY_CARE_PROVIDER_SITE_OTHER): Payer: 59 | Admitting: Family Medicine

## 2011-10-02 ENCOUNTER — Encounter: Payer: Self-pay | Admitting: Family Medicine

## 2011-10-02 VITALS — BP 129/83 | HR 88 | Temp 97.0°F | Ht 66.5 in | Wt 177.8 lb

## 2011-10-02 DIAGNOSIS — R609 Edema, unspecified: Secondary | ICD-10-CM

## 2011-10-02 DIAGNOSIS — G4733 Obstructive sleep apnea (adult) (pediatric): Secondary | ICD-10-CM

## 2011-10-02 DIAGNOSIS — B372 Candidiasis of skin and nail: Secondary | ICD-10-CM

## 2011-10-02 DIAGNOSIS — E663 Overweight: Secondary | ICD-10-CM

## 2011-10-02 DIAGNOSIS — IMO0001 Reserved for inherently not codable concepts without codable children: Secondary | ICD-10-CM

## 2011-10-02 DIAGNOSIS — Z9989 Dependence on other enabling machines and devices: Secondary | ICD-10-CM | POA: Insufficient documentation

## 2011-10-02 DIAGNOSIS — K219 Gastro-esophageal reflux disease without esophagitis: Secondary | ICD-10-CM

## 2011-10-02 HISTORY — DX: Dependence on other enabling machines and devices: Z99.89

## 2011-10-02 HISTORY — DX: Obstructive sleep apnea (adult) (pediatric): G47.33

## 2011-10-02 NOTE — Assessment & Plan Note (Signed)
Patient feels much better since starting her CPAP and is not having trouble sleeping with the device on, she says she sleeps more soundly. She feels more clear and alert during the day. Her complaints reported arrives we'll she's here. She is receiving her machine from [facility] and it shows that in a 30 day. She used her machine 100% of the time for greater than 4 hours. This report is dated from 08/23/2011 to 09/21/2011.

## 2011-10-02 NOTE — Progress Notes (Signed)
Patient ID: Jennifer Sharp, female   DOB: 21-Aug-1960, 51 y.o.   MRN: [id number] SEREENA MARANDO [id number] [birth year] 10/02/2011      Progress Note-Follow Up  Subjective  Chief Complaint  Chief Complaint  Patient presents with  . Follow-up    3 week     HPI  Patient is a 51 year old Caucasian female who is here today for followup. She has taking possession of her CPAP machine and is using it consistently. She reports she feels much better. She sleeps with her family and does not awaken frequently. She still struggles with some restless leg sensations in the evenings but this is tolerable. She has tried to H[place]'s medication with some results. She says overall she feels much better using his CPAP. She feels more rested more clear headed. She continues to have trace pedal edema but with a compression hose and elevation she has seen improvement. She denies chest pain, palpitations, shortness of breath although with exertion she does note she gets winded easier than she used to. She is frustrated about her weight gain but acknowledges she only started exercising about 4 days ago. Modifications and not eating consistently throughout the summer. Her skin has improved greatly. Nystatin was able to help [place] if you're the fungal element of her skin irritation and she's no longer having any itching. No fevers or chills, congestion GI or GU complaints noted today.  Past Medical History  Diagnosis Date  . Allergy     seasonal- occasionally  . Melanoma 2006    on right arm  . Melanoma   . Hyperlipidemia   . Premenstrual migraine   . Edema   . Fatigue 07/25/2010  . Overweight 07/25/2010  . Reflux 07/25/2010  . Colon polyps 07/25/2010  . History of recurrent UTIs 07/25/2010  . Schatzki's ring 07/25/2010  . Umbilical hernia 07/25/2010  . Preventative health care 06/09/2011  . Candidiasis of skin 06/10/2011  . Tachycardia     H/O transient  tach. as a chlid   . OSA on CPAP 10/02/2011    Past Surgical History    Procedure Date  . Oral surgeries 70's and 80's  . Dilation and curettage of uterus [phone]  . Melanoma spot on right arm removed 2006  . Tonsillectomy     childhood  . Hernia repair     left inguinal    Family History  Problem Relation Age of Onset  . Hyperlipidemia Mother   . Hypertension Mother   . Cancer Father 69    ish, colon- remission/ skin cancer  . Other Brother     stent placed  . Hypertension Brother   . Hyperlipidemia Brother   . Other Daughter     heart ablation/ heart arrythmia  . ADD / ADHD Daughter     ADD  . Heart disease Daughter 15    tachyarrythmia, scheduled for ablation   . ADD / ADHD Son     ADD  . Cancer Paternal Uncle     colon  . Diabetes Maternal Grandmother   . Stroke Maternal Grandmother   . Hypertension Maternal Grandmother   . Alzheimer's disease Maternal Grandmother   . Diabetes Maternal Grandfather   . Other Maternal Grandfather     cardiac disease  . Cancer Paternal Grandmother     colon  . Allergy (severe) Brother     smoker  . Other Brother     respiratory problems    History   Social History  . Marital Status:  Married    Spouse Name: N/A    Number of Children: N/A  . Years of Education: N/A   Occupational History  . Not on file.   Social History Main Topics  . Smoking status: Never Smoker   . Smokeless tobacco: Never Used  . Alcohol Use: Yes     socially  . Drug Use: No  . Sexually Active: Yes -- Female partner(s)   Other Topics Concern  . Not on file   Social History Narrative  . No narrative on file    Current Outpatient Prescriptions on File Prior to Visit  Medication Sig Dispense Refill  . bifidobacterium infantis (ALIGN) capsule Take 1 capsule by mouth daily.      . calcium carbonate (OS-CAL) 600 MG TABS Take 600 mg by mouth 2 (two) times daily with a meal.        . esomeprazole (NEXIUM) 40 MG capsule Take 40 mg by mouth daily before breakfast.        . levonorgestrel-ethinyl estradiol  (AVIANE,ALESSE,LESSINA) 0.1-20 MG-MCG tablet Take 1 tablet by mouth daily. Active pills only  4 Package  3  . Omega-3 Fatty Acids (FISH OIL PO) Take by mouth.      . ranitidine (ZANTAC) 300 MG tablet Take 1 tablet (300 mg total) by mouth at bedtime.  30 tablet  0  . methylphenidate (RITALIN) 10 MG tablet Take 1 tablet (10 mg total) by mouth daily.  30 tablet  0    No Known Allergies  Review of Systems  Review of Systems  Constitutional: Negative for fever and malaise/fatigue.  HENT: Negative for congestion.   Eyes: Negative for discharge.  Respiratory: Negative for shortness of breath.   Cardiovascular: Positive for leg swelling. Negative for chest pain and palpitations.  Gastrointestinal: Negative for nausea, abdominal pain and diarrhea.  Genitourinary: Negative for dysuria.  Musculoskeletal: Negative for falls.  Skin: Negative for rash.  Neurological: Negative for loss of consciousness and headaches.  Endo/Heme/Allergies: Negative for polydipsia.  Psychiatric/Behavioral: Negative for depression and suicidal ideas. The patient is not nervous/anxious and does not have insomnia.     Objective  BP 129/83  Pulse 88  Temp 97 F (36.1 C) (Temporal)  Ht 5' 6.5" (1.689 m)  Wt 177 lb 12.8 oz (80.65 kg)  BMI 28.27 kg/m2  SpO2 99%  Physical Exam  Physical Exam  Constitutional: She is oriented to person, place, and time and well-developed, well-nourished, and in no distress. No distress.  HENT:  Head: Normocephalic and atraumatic.  Eyes: Conjunctivae are normal.  Neck: Neck supple. No thyromegaly present.  Cardiovascular: Normal rate, regular rhythm and normal heart sounds.   No murmur heard. Pulmonary/Chest: Effort normal and breath sounds normal. She has no wheezes.  Abdominal: She exhibits no distension and no mass.  Musculoskeletal: She exhibits no edema.  Lymphadenopathy:    She has no cervical adenopathy.  Neurological: She is alert and oriented to person, place, and  time.  Skin: Skin is warm and dry. No rash noted. She is not diaphoretic.  Psychiatric: Memory, affect and judgment normal.    Lab Results  Component Value Date   TSH 2.648 05/31/2011   Lab Results  Component Value Date   WBC 7.8 05/31/2011   HGB 14.1 05/31/2011   HCT 44.8 05/31/2011   MCV 95.3 05/31/2011   PLT 326 05/31/2011   Lab Results  Component Value Date   CREATININE 0.86 05/31/2011   BUN 13 05/31/2011   NA 139 05/31/2011  K 4.1 05/31/2011   CL 104 05/31/2011   CO2 27 05/31/2011   Lab Results  Component Value Date   ALT 10 05/31/2011   AST 12 05/31/2011   ALKPHOS 46 05/31/2011   BILITOT 0.5 05/31/2011   Lab Results  Component Value Date   CHOL 150 05/31/2011   Lab Results  Component Value Date   HDL 44 05/31/2011   Lab Results  Component Value Date   LDLCALC 81 05/31/2011   Lab Results  Component Value Date   TRIG 127 05/31/2011   Lab Results  Component Value Date   CHOLHDL 3.4 05/31/2011     Assessment & Plan  Overweight Has not had a good result with weight loss. Did start exercising regularly this week and discussed DASH diet and need for good sleep and activity  Candidiasis of skin Improved greatly with treatment, she will report if this worsens and keep the area dry as much as possible and continue to use Probiotics at least intermittetnly  Edema Still trace but not worse, gets good results with compression hose at work. She will minimize sodium, elevate feet above heart when possible and consider Lasix prn if this is persistent. Modest weight loss can be helpful  OSA on CPAP Patient feels much better since starting her CPAP and is not having trouble sleeping with the device on, she says she sleeps more soundly. She feels more clear and alert during the day. Her complaints reported arrives we'll she's here. [facility] and it shows that in a 30 day. She used her machine 100% of the time for greater than 4 hours. This  report is dated from 08/23/2011 to 09/21/2011.  Reflux No c/o today

## 2011-10-02 NOTE — Assessment & Plan Note (Signed)
Still trace but not worse, gets good results with compression hose at work. She will minimize sodium, elevate feet above heart when possible and consider Lasix prn if this is persistent. Modest weight loss can be helpful

## 2011-10-02 NOTE — Patient Instructions (Addendum)
Edema Edema is an abnormal build-up of fluids in tissues. Because this is partly dependent on gravity (water flows to the lowest place), it is more common in the leg sand thighs (lower extremities). It is also common in the looser tissues, like around the eyes. Painless swelling of the feet and ankles is common and increases as a person ages. It may affect both legs and may include the calves or even thighs. When squeezed, the fluid may move out of the affected area and may leave a dent for a few moments. CAUSES   Prolonged standing or sitting in one place for extended periods of time. Movement helps pump tissue fluid into the veins, and absence of movement prevents this, resulting in edema.   Varicose veins. The valves in the veins do not work as well as they should. This causes fluid to leak into the tissues.   Fluid and salt overload.   Injury, burn, or surgery to the leg, ankle, or foot, may damage veins and allow fluid to leak out.   Sunburn damages vessels. Leaky vessels allow fluid to go out into the sunburned tissues.   Allergies (from insect bites or stings, medications or chemicals) cause swelling by allowing vessels to become leaky.   Protein in the blood helps keep fluid in your vessels. Low protein, as in malnutrition, allows fluid to leak out.   Hormonal changes, including pregnancy and menstruation, cause fluid retention. This fluid may leak out of vessels and cause edema.   Medications that cause fluid retention. Examples are sex hormones, blood pressure medications, steroid treatment, or anti-depressants.   Some illnesses cause edema, especially heart failure, kidney disease, or liver disease.   Surgery that cuts veins or lymph nodes, such as surgery done for the heart or for breast cancer, may result in edema.  DIAGNOSIS  Your caregiver is usually easily able to determine what is causing your swelling (edema) by simply asking what is wrong (getting a history) and examining  you (doing a physical). Sometimes x-rays, EKG (electrocardiogram or heart tracing), and blood work may be done to evaluate for underlying medical illness. TREATMENT  General treatment includes:  Leg elevation (or elevation of the affected body part).   Restriction of fluid intake.   Prevention of fluid overload.   Compression of the affected body part. Compression with elastic bandages or support stockings squeezes the tissues, preventing fluid from entering and forcing it back into the blood vessels.   Diuretics (also called water pills or fluid pills) pull fluid out of your body in the form of increased urination. These are effective in reducing the swelling, but can have side effects and must be used only under your caregiver's supervision. Diuretics are appropriate only for some types of edema.  The specific treatment can be directed at any underlying causes discovered. Heart, liver, or kidney disease should be treated appropriately. HOME CARE INSTRUCTIONS   Elevate the legs (or affected body part) above the level of the heart, while lying down.   Avoid sitting or standing still for prolonged periods of time.   Avoid putting anything directly under the knees when lying down, and do not wear constricting clothing or garters on the upper legs.   Exercising the legs causes the fluid to work back into the veins and lymphatic channels. This may help the swelling go down.   The pressure applied by elastic bandages or support stockings can help reduce ankle swelling.   A low-salt diet may help reduce fluid  retention and decrease the ankle swelling.   Take any medications exactly as prescribed.  SEEK MEDICAL CARE IF:  Your edema is not responding to recommended treatments. SEEK IMMEDIATE MEDICAL CARE IF:   You develop shortness of breath or chest pain.   You cannot breathe when you lay down; or if, while lying down, you have to get up and go to the window to get your breath.   You  are having increasing swelling without relief from treatment.   You develop a fever over 102 F (38.9 C).   You develop pain or redness in the areas that are swollen.   Tell your caregiver right away if you have gained 3 lb/1.4 kg in 1 day or 5 lb/2.3 kg in a week.  MAKE SURE YOU:   Understand these instructions.   Will watch your condition.   Will get help right away if you are not doing well or get worse.  Document Released: 02/04/2005 Document Revised: 01/24/2011 Document Reviewed: 09/23/2007 [facility] Patient Information 2012 [place], [place].   Call if you decide some Lasix to use infrequently at 20 mg dose  Last labs were 05/31/11 so will want labs at the anniversary or later and appt after that

## 2011-10-02 NOTE — Assessment & Plan Note (Signed)
No c/o today 

## 2011-10-02 NOTE — Assessment & Plan Note (Addendum)
Has not had a good result with weight loss. Did start exercising regularly this week and discussed DASH diet and need for good sleep and activity

## 2011-10-02 NOTE — Assessment & Plan Note (Signed)
Improved greatly with treatment, she will report if this worsens and keep the area dry as much as possible and continue to use Probiotics at least intermittetnly

## 2011-10-11 ENCOUNTER — Encounter: Payer: Self-pay | Admitting: Family Medicine

## 2012-02-07 ENCOUNTER — Other Ambulatory Visit: Payer: Self-pay

## 2012-02-07 MED ORDER — ESOMEPRAZOLE MAGNESIUM 40 MG PO CPDR: 40.0000 mg | DELAYED_RELEASE_CAPSULE | Freq: Every day | ORAL | Status: DC

## 2012-04-04 ENCOUNTER — Other Ambulatory Visit: Payer: Self-pay

## 2012-06-03 ENCOUNTER — Telehealth: Payer: Self-pay | Admitting: Family Medicine

## 2012-06-03 DIAGNOSIS — Z0000 Encounter for general adult medical examination without abnormal findings: Secondary | ICD-10-CM

## 2012-06-03 DIAGNOSIS — E785 Hyperlipidemia, unspecified: Secondary | ICD-10-CM

## 2012-06-08 NOTE — Telephone Encounter (Signed)
Please order lipid, renal, tsh, cbc, hepatic for preventative exam at [facility] if possible for second half of may

## 2012-06-08 NOTE — Telephone Encounter (Signed)
Please advise 

## 2012-06-09 ENCOUNTER — Other Ambulatory Visit: Payer: Self-pay | Admitting: Family Medicine

## 2012-06-09 NOTE — Telephone Encounter (Signed)
Orders faxed to [phone]

## 2012-07-15 ENCOUNTER — Encounter: Payer: Self-pay | Admitting: Family Medicine

## 2012-07-15 ENCOUNTER — Ambulatory Visit (INDEPENDENT_AMBULATORY_CARE_PROVIDER_SITE_OTHER): Payer: 59 | Admitting: Family Medicine

## 2012-07-15 VITALS — BP 110/80 | HR 89 | Temp 98.0°F | Ht 66.5 in | Wt 163.0 lb

## 2012-07-15 DIAGNOSIS — G4733 Obstructive sleep apnea (adult) (pediatric): Secondary | ICD-10-CM

## 2012-07-15 DIAGNOSIS — Z9989 Dependence on other enabling machines and devices: Secondary | ICD-10-CM

## 2012-07-15 DIAGNOSIS — K219 Gastro-esophageal reflux disease without esophagitis: Secondary | ICD-10-CM

## 2012-07-15 DIAGNOSIS — C439 Malignant melanoma of skin, unspecified: Secondary | ICD-10-CM

## 2012-07-15 DIAGNOSIS — N951 Menopausal and female climacteric states: Secondary | ICD-10-CM

## 2012-07-15 DIAGNOSIS — R5381 Other malaise: Secondary | ICD-10-CM

## 2012-07-15 DIAGNOSIS — IMO0001 Reserved for inherently not codable concepts without codable children: Secondary | ICD-10-CM

## 2012-07-15 DIAGNOSIS — K429 Umbilical hernia without obstruction or gangrene: Secondary | ICD-10-CM

## 2012-07-15 DIAGNOSIS — E785 Hyperlipidemia, unspecified: Secondary | ICD-10-CM

## 2012-07-15 DIAGNOSIS — G4726 Circadian rhythm sleep disorder, shift work type: Secondary | ICD-10-CM

## 2012-07-15 DIAGNOSIS — R232 Flushing: Secondary | ICD-10-CM

## 2012-07-15 DIAGNOSIS — Z0000 Encounter for general adult medical examination without abnormal findings: Secondary | ICD-10-CM

## 2012-07-15 DIAGNOSIS — K5900 Constipation, unspecified: Secondary | ICD-10-CM

## 2012-07-15 DIAGNOSIS — R5383 Other fatigue: Secondary | ICD-10-CM

## 2012-07-15 MED ORDER — PANTOPRAZOLE SODIUM 40 MG PO TBEC: 40.0000 mg | DELAYED_RELEASE_TABLET | Freq: Every day | ORAL | Status: DC

## 2012-07-15 MED ORDER — METHYLPHENIDATE HCL 10 MG PO TABS: 10.0000 mg | ORAL_TABLET | Freq: Every day | ORAL | Status: DC

## 2012-07-15 NOTE — Patient Instructions (Addendum)
Start with a daily probiotic such as Digestive Advantage and add Benefiber twice a day If not going every 3 days then add Miralax 1/2 to 1 scoop and tolerated. can add Senna and or Docusate (Pericolace has both) Consider Avoiding GMOs Witch hazel Astringent to skin as needed  Next visit annual visit with labs prior at [facility] lipid, renal, hepatic, tsh   Constipation, Adult Constipation is when a person has fewer than 3 bowel movements a week; has difficulty having a bowel movement; or has stools that are dry, hard, or larger than normal. As people grow older, constipation is more common. If you try to fix constipation with medicines that make you have a bowel movement (laxatives), the problem may get worse. Long-term laxative use may cause the muscles of the colon to become weak. A low-fiber diet, not taking in enough fluids, and taking certain medicines may make constipation worse. CAUSES   Certain medicines, such as antidepressants, pain medicine, iron supplements, antacids, and water pills.   Certain diseases, such as diabetes, irritable bowel syndrome (IBS), thyroid disease, or depression.   Not drinking enough water.   Not eating enough fiber-rich foods.   Stress or travel.  Lack of physical activity or exercise.  Not going to the restroom when there is the urge to have a bowel movement.  Ignoring the urge to have a bowel movement.  Using laxatives too much. SYMPTOMS   Having fewer than 3 bowel movements a week.   Straining to have a bowel movement.   Having hard, dry, or larger than normal stools.   Feeling full or bloated.   Pain in the lower abdomen.  Not feeling relief after having a bowel movement. DIAGNOSIS  Your caregiver will take a medical history and perform a physical exam. Further testing may be done for severe constipation. Some tests may include:   A barium enema X-ray to examine your rectum, colon, and sometimes, your small  intestine.  A sigmoidoscopy to examine your lower colon.  A colonoscopy to examine your entire colon. TREATMENT  Treatment will depend on the severity of your constipation and what is causing it. Some dietary treatments include drinking more fluids and eating more fiber-rich foods. Lifestyle treatments may include regular exercise. If these diet and lifestyle recommendations do not help, your caregiver may recommend taking over-the-counter laxative medicines to help you have bowel movements. Prescription medicines may be prescribed if over-the-counter medicines do not work.  HOME CARE INSTRUCTIONS   Increase dietary fiber in your diet, such as fruits, vegetables, whole grains, and beans. Limit high-fat and processed sugars in your diet, such as [place] fries, hamburgers, cookies, candies, and soda.   A fiber supplement may be added to your diet if you cannot get enough fiber from foods.   Drink enough fluids to keep your urine clear or pale yellow.   Exercise regularly or as directed by your caregiver.   Go to the restroom when you have the urge to go. Do not hold it.  Only take medicines as directed by your caregiver. Do not take other medicines for constipation without talking to your caregiver first. SEEK IMMEDIATE MEDICAL CARE IF:   You have bright red blood in your stool.   Your constipation lasts for more than 4 days or gets worse.   You have abdominal or rectal pain.   You have thin, pencil-like stools.  You have unexplained weight loss. MAKE SURE YOU:   Understand these instructions.  Will watch your condition.  Will get help right away if you are not doing well or get worse. Document Released: 11/03/2003 Document Revised: 04/29/2011 Document Reviewed: 01/08/2011 [facility] Patient Information 2014 [place], [place].

## 2012-07-19 ENCOUNTER — Encounter: Payer: Self-pay | Admitting: Family Medicine

## 2012-07-19 DIAGNOSIS — K5901 Slow transit constipation: Secondary | ICD-10-CM

## 2012-07-19 DIAGNOSIS — K5900 Constipation, unspecified: Secondary | ICD-10-CM

## 2012-07-19 HISTORY — DX: Slow transit constipation: K59.01

## 2012-07-19 HISTORY — DX: Constipation, unspecified: K59.00

## 2012-07-19 NOTE — Assessment & Plan Note (Signed)
Alternating Zantac and Nexium qod. Avoid offending foods.

## 2012-07-19 NOTE — Assessment & Plan Note (Signed)
Minimize simple carbs, eat small, frequent meals with proteins each time, regular exercise, 8 hours of sleep

## 2012-07-19 NOTE — Progress Notes (Signed)
Patient ID: Jennifer Sharp, female   DOB: 02-14-1961, 52 y.o.   MRN: [id number] Jennifer Sharp [id number] February 05, 1961 07/19/2012      Progress Note-Follow Up  Subjective  Chief Complaint  Chief Complaint  Patient presents with  . Annual Exam    physical w/ fasting labs    HPI  Patient is a 52 year old Caucasian female who is in today for followup. She's been working hard at diet and exercise changes and has lost a good deal of weight. She reports overall feeling better. Skin irritation and rashes endocrine for her breasts continue to occur but are greatly improved. Reflux is improved. She's now taking Zantac and Nexium on alternate days with good results. Does struggle with constipation. Is taking Dulcolax occasionally. We'll go 4-5 days without if she does take something. Takes her Ritalin infrequently but does help her concentrate when she needs it. Exercising regularly. Umbilical hernia is enlarging and occasionally mildly tender.  Past Medical History  Diagnosis Date  . Allergy     seasonal- occasionally  . Melanoma 2006    on right arm  . Melanoma   . Hyperlipidemia   . Premenstrual migraine   . Edema   . Fatigue 07/25/2010  . Overweight(278.02) 07/25/2010  . Reflux 07/25/2010  . Colon polyps 07/25/2010  . History of recurrent UTIs 07/25/2010  . Schatzki's ring 07/25/2010  . Umbilical hernia 07/25/2010  . Preventative health care 06/09/2011  . Candidiasis of skin 06/10/2011  . Tachycardia     H/O transient  tach. as a chlid   . OSA on CPAP 10/02/2011    Past Surgical History  Procedure Laterality Date  . Oral surgeries  70's and 80's  . Dilation and curettage of uterus  [phone]  . Melanoma spot on right arm removed  2006  . Tonsillectomy      childhood  . Hernia repair      left inguinal    Family History  Problem Relation Age of Onset  . Hyperlipidemia Mother   . Hypertension Mother   . Cancer Father 27    ish, colon- remission/ skin cancer  . Other Brother    stent placed  . Hypertension Brother   . Hyperlipidemia Brother   . Heart disease Brother     2 cardiac stents  . Other Daughter     heart ablation/ heart arrythmia  . ADD / ADHD Daughter     ADD  . Heart disease Daughter 15    tachyarrythmia, scheduled for ablation   . ADD / ADHD Son     ADD  . Cancer Paternal Uncle     colon  . Diabetes Maternal Grandmother   . Stroke Maternal Grandmother   . Hypertension Maternal Grandmother   . Alzheimer's disease Maternal Grandmother   . Diabetes Maternal Grandfather   . Other Maternal Grandfather     cardiac disease  . Cancer Paternal Grandmother     colon  . Allergy (severe) Brother     smoker  . Other Brother     respiratory problems    History   Social History  . Marital Status: Married    Spouse Name: N/A    Number of Children: N/A  . Years of Education: N/A   Occupational History  . Not on file.   Social History Main Topics  . Smoking status: Never Smoker   . Smokeless tobacco: Never Used  . Alcohol Use: Yes     Comment: socially  . Drug Use:  No  . Sexually Active: Yes -- Female partner(s)   Other Topics Concern  . Not on file   Social History Narrative  . No narrative on file    Current Outpatient Prescriptions on File Prior to Visit  Medication Sig Dispense Refill  . calcium carbonate (OS-CAL) 600 MG TABS Take 600 mg by mouth daily.       Marland Kitchen levonorgestrel-ethinyl estradiol (AVIANE,ALESSE,LESSINA) 0.1-20 MG-MCG tablet Take 1 tablet by mouth daily. Active pills only  4 Package  3  . Omega-3 Fatty Acids (FISH OIL PO) Take by mouth.      . ranitidine (ZANTAC) 300 MG tablet TAKE 1 TABLET AT BEDTIME. TRY TAKING 1 TAB EVERY OTHER DAY ALTERNATING WITH NEXIUM, BUT DAILY IF NEEDED  30 tablet  0   No current facility-administered medications on file prior to visit.    No Known Allergies  Review of Systems  Review of Systems  Constitutional: Negative for fever and malaise/fatigue.  HENT: Negative for congestion.    Eyes: Negative for discharge.  Respiratory: Negative for shortness of breath.   Cardiovascular: Negative for chest pain, palpitations and leg swelling.  Gastrointestinal: Positive for heartburn and constipation. Negative for nausea, abdominal pain and diarrhea.  Genitourinary: Negative for dysuria.  Musculoskeletal: Positive for joint pain. Negative for falls.       Left knee stiffness with exercise, no injury redness or swelling.  Skin: Negative for rash.  Neurological: Negative for loss of consciousness and headaches.  Endo/Heme/Allergies: Negative for polydipsia.  Psychiatric/Behavioral: Negative for depression and suicidal ideas. The patient is not nervous/anxious and does not have insomnia.     Objective  BP 110/80  Pulse 89  Temp(Src) 98 F (36.7 C) (Oral)  Ht 5' 6.5" (1.689 m)  Wt 163 lb (73.936 kg)  BMI 25.92 kg/m2  SpO2 99%  Physical Exam  Physical Exam  Constitutional: She is oriented to person, place, and time and well-developed, well-nourished, and in no distress. No distress.  HENT:  Head: Normocephalic and atraumatic.  Eyes: Conjunctivae are normal.  Neck: Neck supple. No thyromegaly present.  Cardiovascular: Normal rate, regular rhythm and normal heart sounds.   No murmur heard. Pulmonary/Chest: Effort normal and breath sounds normal. She has no wheezes.  Abdominal: Soft. Bowel sounds are normal. She exhibits mass. She exhibits no distension. There is no tenderness. There is no rebound and no guarding.  Hernia palp to left of umbilicus, reducible  Musculoskeletal: She exhibits no edema.  Lymphadenopathy:    She has no cervical adenopathy.  Neurological: She is alert and oriented to person, place, and time.  Skin: Skin is warm and dry. No rash noted. She is not diaphoretic.  Psychiatric: Memory, affect and judgment normal.    Lab Results  Component Value Date   TSH 2.648 05/31/2011   Lab Results  Component Value Date   WBC 7.8 05/31/2011   HGB 14.1  05/31/2011   HCT 44.8 05/31/2011   MCV 95.3 05/31/2011   PLT 326 05/31/2011   Lab Results  Component Value Date   CREATININE 0.86 05/31/2011   BUN 13 05/31/2011   NA 139 05/31/2011   K 4.1 05/31/2011   CL 104 05/31/2011   CO2 27 05/31/2011   Lab Results  Component Value Date   ALT 10 05/31/2011   AST 12 05/31/2011   ALKPHOS 46 05/31/2011   BILITOT 0.5 05/31/2011   Lab Results  Component Value Date   CHOL 150 05/31/2011   Lab Results  Component Value Date  HDL 44 05/31/2011   Lab Results  Component Value Date   LDLCALC 81 05/31/2011   Lab Results  Component Value Date   TRIG 127 05/31/2011   Lab Results  Component Value Date   CHOLHDL 3.4 05/31/2011     Assessment & Plan  Preventative health care Is not due for pap. Fasting labs today, first colonoscopy at 41 has now had 3 colonoscopies. Last was normal.  Hyperlipidemia Avoid trans fats, increase exercise,continue krill oil caps.   Hot flashes Minimize simple carbs, eat small, frequent meals with proteins each time, regular exercise, 8 hours of sleep  Reflux Alternating Zantac and Nexium qod. Avoid offending foods.   OSA on CPAP Using CPAP regularly.   Melanoma folllowing with dermatology  Umbilical hernia Enlarging, becoming symptomatic, consider referral to surgery if it becomes any more symptomatic, encouraged good hydration, hi fiber diet, avoid heavy lifting. Add a probiotic  Unspecified constipation Encouraged probiotics, good hydration, increased fiber and/or Senna s as needed.

## 2012-07-19 NOTE — Assessment & Plan Note (Signed)
Avoid trans fats, increase exercise,continue krill oil caps.

## 2012-07-19 NOTE — Assessment & Plan Note (Signed)
Is not due for pap. Fasting labs today, first colonoscopy at 41 has now had 3 colonoscopies. Last was normal.

## 2012-07-19 NOTE — Assessment & Plan Note (Signed)
Enlarging, becoming symptomatic, consider referral to surgery if it becomes any more symptomatic, encouraged good hydration, hi fiber diet, avoid heavy lifting. Add a probiotic

## 2012-07-19 NOTE — Assessment & Plan Note (Signed)
folllowing with dermatology

## 2012-07-19 NOTE — Assessment & Plan Note (Signed)
Using CPAP regularly

## 2012-07-19 NOTE — Assessment & Plan Note (Signed)
Encouraged probiotics, good hydration, increased fiber and/or Senna s as needed.

## 2012-07-30 ENCOUNTER — Encounter: Payer: Self-pay | Admitting: Family Medicine

## 2012-08-03 ENCOUNTER — Telehealth: Payer: Self-pay | Admitting: Family Medicine

## 2012-08-03 NOTE — Telephone Encounter (Signed)
Was in on 10-02-2011 to see Dr Abner Greenspan It was coded as obesity and if it was nurtrition or surgery consult they would pay.  She needs to have this visit recoded so the insurance will pay.

## 2012-08-03 NOTE — Telephone Encounter (Signed)
So I am not sure what she means, I am happy to redo it. But is she saying I need to code something other than the weight? Or say it was for nutrition counseling. I may need help from our billing dept to fix this but once I understand what the patient is saying her insurance is saying I willwork on it

## 2012-08-03 NOTE — Telephone Encounter (Signed)
Please advise 

## 2012-08-04 NOTE — Telephone Encounter (Signed)
thanks

## 2012-08-04 NOTE — Telephone Encounter (Signed)
All labs normal per provider, email sent in reply/SLS

## 2012-08-04 NOTE — Telephone Encounter (Signed)
Patient informed; gave billing number to patient [[phone]], she will let us know what needs to be done/SLS

## 2012-08-12 ENCOUNTER — Encounter: Payer: Self-pay | Admitting: Family Medicine

## 2012-08-27 ENCOUNTER — Encounter: Payer: Self-pay | Admitting: Family Medicine

## 2012-08-27 NOTE — Telephone Encounter (Signed)
External [facility] labs; copy mailed to pt/SLS

## 2012-09-03 ENCOUNTER — Telehealth (INDEPENDENT_AMBULATORY_CARE_PROVIDER_SITE_OTHER): Payer: Self-pay | Admitting: Surgery

## 2012-09-03 ENCOUNTER — Encounter (INDEPENDENT_AMBULATORY_CARE_PROVIDER_SITE_OTHER): Payer: Self-pay | Admitting: Surgery

## 2012-09-03 ENCOUNTER — Ambulatory Visit (INDEPENDENT_AMBULATORY_CARE_PROVIDER_SITE_OTHER): Payer: Commercial Managed Care - PPO | Admitting: Surgery

## 2012-09-03 VITALS — BP 100/70 | HR 84 | Resp 16 | Ht 66.5 in | Wt 166.6 lb

## 2012-09-03 DIAGNOSIS — K439 Ventral hernia without obstruction or gangrene: Secondary | ICD-10-CM

## 2012-09-03 NOTE — Telephone Encounter (Signed)
Pt made aware Financial responsibility

## 2012-09-03 NOTE — Progress Notes (Signed)
Re:   Jennifer Sharp DOB:   1960/08/06 MRN:   [id number]  ASSESSMENT AND PLAN: 1.  Ventral hernia (probably not umbilical)  I discussed the indications and complications of hernia surgery with the patient.  I discussed both the laparoscopic and open approach to hernia repair..  The potential risks of hernia surgery include, but are not limited to, bleeding, infection, open surgery, nerve injury, and recurrence of the hernia.  I provided the patient literature about hernia surgery.  I think it depends on the fascial defect as to whether I will repair this open vs closed.  I discussed both and lean towards a laparoscopic repair at this time.  It sounds like September would be a good time for her.  2.  History of left inguinal hernia repair, open - 02/26/2007 - D. Jalaiya Oyster 3.  History of melanoma - right arm.  And premelanoma excised from left thigh. 4.  Hyperlipidemia 5.  OSA on CPAP since 2013.  She's happy with this. 6.  Mild swelling of her ankles.  Recently started on Align (a probiotic) 7.  History of constipation. 8.  GERD 9.  Peri-menopausal symptoms  No chief complaint on file.  REFERRING PHYSICIAN: Danise Edge, MD  HISTORY OF PRESENT ILLNESS: Jennifer Sharp is a 52 y.o. (DOB: [birth year])  white  female whose primary care physician is Danise Edge, MD and comes to me today for ventral/umbilical hernia.  I saw her in 2011 for the same hernia.  At that time, she felt the hernia, but it do not bother her much.  But she has now noticed an increase in size of the hernia/mass.  And she is having more discomfort with the hernia. Her prior abdominal surgery was laparoscopy for fertility around 1993.  She was noted to have a torted left ovary.  And an open left inguinal hernia in 2009.    Past Medical History  Diagnosis Date  . Allergy     seasonal- occasionally  . Melanoma 2006    on right arm  . Melanoma   . Hyperlipidemia   . Premenstrual migraine   . Edema   . Fatigue  07/25/2010  . Overweight(278.02) 07/25/2010  . Reflux 07/25/2010  . Colon polyps 07/25/2010  . History of recurrent UTIs 07/25/2010  . Schatzki's ring 07/25/2010  . Umbilical hernia 07/25/2010  . Preventative health care 06/09/2011  . Candidiasis of skin 06/10/2011  . Tachycardia     H/O transient  tach. as a chlid   . OSA on CPAP 10/02/2011  . Unspecified constipation 07/19/2012      Past Surgical History  Procedure Laterality Date  . Oral surgeries  70's and 80's  . Dilation and curettage of uterus  [phone]  . Melanoma spot on right arm removed  2006  . Tonsillectomy      childhood  . Hernia repair      left inguinal      Current Outpatient Prescriptions  Medication Sig Dispense Refill  . calcium carbonate (OS-CAL) 600 MG TABS Take 600 mg by mouth daily.       Marland Kitchen esomeprazole (NEXIUM) 40 MG capsule Take 40 mg by mouth as needed. Takes occasionally      . levonorgestrel-ethinyl estradiol (AVIANE,ALESSE,LESSINA) 0.1-20 MG-MCG tablet Take 1 tablet by mouth daily. Active pills only  4 Package  3  . Omega-3 Fatty Acids (FISH OIL PO) Take by mouth.      . pantoprazole (PROTONIX) 40 MG tablet Take 1 tablet (40 mg  total) by mouth daily.  90 tablet  3  . ranitidine (ZANTAC) 300 MG tablet TAKE 1 TABLET AT BEDTIME. TRY TAKING 1 TAB EVERY OTHER DAY ALTERNATING WITH NEXIUM, BUT DAILY IF NEEDED  30 tablet  0  . methylphenidate (RITALIN) 10 MG tablet Take 1 tablet (10 mg total) by mouth daily.  30 tablet  0   No current facility-administered medications for this visit.     No Known Allergies  REVIEW OF SYSTEMS: Skin:  History of melanoma. Infection:  No history of hepatitis or HIV.  No history of MRSA. Neurologic:  No history of stroke.  No history of seizure.  No history of headaches. Cardiac:  No history of hypertension. No history of heart disease.  No history of seeing a cardiologist. Pulmonary:  OSA on CPAP x 1 year.  Endocrine:  No diabetes. No thyroid disease. Gastrointestinal: GERD  requiring PPI.  No history of liver disease.  No history of gall bladder disease.  No history of pancreas disease.  Family history of colon cancer.  Last colonoscopy by Dr. Clent Ridges - 2011. Urologic:  No history of kidney stones.  No history of bladder infections. GYN:  3 miscarriages.  2 children.  Sees Dr. Laurel Dimmer. Musculoskeletal:  No history of joint or back disease. Hematologic:  No bleeding disorder.  No history of anemia.  Not anticoagulated. Psycho-social:  The patient is oriented.   The patient has no obvious psychologic or social impairment to understanding our conversation and plan.  SOCIAL and FAMILY HISTORY: Married. Has 2 children: 19 and 14. Works at [employer]  PHYSICAL EXAM: BP 100/70  Pulse 84  Resp 16  Ht 5' 6.5" (1.689 m)  Wt 166 lb 9.6 oz (75.569 kg)  BMI 26.49 kg/m2  General: WN WF who is alert and generally healthy appearing.  HEENT: Normal. Pupils equal. Neck: Supple. No mass.  No thyroid mass. Lymph Nodes:  No supraclavicular or cervical nodes. Lungs: Clear to auscultation and symmetric breath sounds. Heart:  RRR. No murmur or rub. Abdomen: Soft. No mass. No tenderness. No hernia. Normal bowel sounds.  3 - 4 cm bulge, just above the umbilicus.  Not really reducible.  3 cm scar RUQ from prior skin lesion excised.  Left inguinal scar. Rectal: Not done. Extremities:  Good strength and ROM  in upper and lower extremities. Neurologic:  Grossly intact to motor and sensory function. Psychiatric: Has normal mood and affect. Behavior is normal.   DATA REVIEWED: Notes in Epic.  Ovidio Kin, MD,  [facility] [address] [place]  [address]   [place], [place]    [address] Phone:  [phone] FAX:  [phone]

## 2012-09-07 ENCOUNTER — Telehealth (INDEPENDENT_AMBULATORY_CARE_PROVIDER_SITE_OTHER): Payer: Self-pay | Admitting: Surgery

## 2012-09-07 NOTE — Telephone Encounter (Signed)
Pt made aware financial responsibility is not scheduling at this time  Aware 90 days order are good for and may need another OV

## 2012-10-14 NOTE — Progress Notes (Signed)
Dr Ezzard Standing-  NEED PRE OP ORDERS PLEASE   Has appt PST 10/20/12   [facility]

## 2012-10-15 ENCOUNTER — Other Ambulatory Visit: Payer: Self-pay | Admitting: Family Medicine

## 2012-10-16 NOTE — Patient Instructions (Addendum)
Jennifer Sharp  10/16/2012   Your procedure is scheduled on:  10/29/2012              Surgery 1203pm-133pm  Report to [facility] at     0930 AM.  Call this number if you have problems the morning of surgery: [phone]   Remember:   Do not eat food or drink liquids after midnight.   Take these medicines the morning of surgery with A SIP OF WATER:    Do not wear jewelry, make-up or nail polish.  Do not wear lotions, powders, or perfumes.   Do not shave 48 hours prior to surgery.   Do not bring valuables to the hospital.  Contacts, dentures or bridgework may not be worn into surgery.     Patients discharged the day of surgery will not be allowed to drive  home.  Name and phone number of your driver:    SEE CHG INSTRUCTION SHEET    Please read over the following fact sheets that you were given coughing and deep breathing exercises, leg exercises               Failure to comply with these instructions may result in cancellation of your surgery.                Patient Signature ____________________________              Nurse Signature _____________________________

## 2012-10-16 NOTE — Progress Notes (Signed)
Need orders in EPIc.  Surgery scheduled for 10/29/12.  preop appointment on 10/20/12 at 1100am.  Thank You.

## 2012-10-20 ENCOUNTER — Encounter (HOSPITAL_COMMUNITY): Payer: Self-pay | Admitting: Pharmacy Technician

## 2012-10-20 ENCOUNTER — Encounter (HOSPITAL_COMMUNITY)
Admission: RE | Admit: 2012-10-20 | Discharge: 2012-10-20 | Disposition: A | Discharge status: Home / Self Care | Payer: 59 | Source: Ambulatory Visit | Attending: Surgery | Admitting: Surgery

## 2012-10-20 ENCOUNTER — Encounter (HOSPITAL_COMMUNITY): Payer: Self-pay

## 2012-10-20 DIAGNOSIS — K439 Ventral hernia without obstruction or gangrene: Secondary | ICD-10-CM | POA: Insufficient documentation

## 2012-10-20 DIAGNOSIS — Z01812 Encounter for preprocedural laboratory examination: Secondary | ICD-10-CM | POA: Insufficient documentation

## 2012-10-20 HISTORY — DX: Effusion, unspecified ankle: M25.473

## 2012-10-20 HISTORY — DX: Gastro-esophageal reflux disease without esophagitis: K21.9

## 2012-10-20 LAB — CBC
Hemoglobin: 14.7 g/dL (ref 12.0–15.0)
MCH: 30.9 pg (ref 26.0–34.0)
MCHC: 33.6 g/dL (ref 30.0–36.0)
MCV: 92 fL (ref 78.0–100.0)
Platelets: 340 10*3/uL (ref 150–400)
RBC: 4.76 MIL/uL (ref 3.87–5.11)

## 2012-10-20 NOTE — Progress Notes (Signed)
Sleep Study docs in [facility] 08/03/11  ECHO 2012 EPIC

## 2012-10-22 ENCOUNTER — Other Ambulatory Visit (INDEPENDENT_AMBULATORY_CARE_PROVIDER_SITE_OTHER): Payer: Self-pay | Admitting: Surgery

## 2012-10-28 NOTE — Progress Notes (Signed)
Patient stated is aware surgery time has been changed in am- will arrive 0830 am. NPO after midnight

## 2012-10-29 ENCOUNTER — Encounter (HOSPITAL_COMMUNITY)
Admission: RE | Disposition: A | Discharge status: Home / Self Care | Payer: Self-pay | Source: Ambulatory Visit | Attending: Surgery

## 2012-10-29 ENCOUNTER — Encounter (HOSPITAL_COMMUNITY): Payer: Self-pay | Admitting: Anesthesiology

## 2012-10-29 ENCOUNTER — Observation Stay (HOSPITAL_COMMUNITY)
Admission: RE | Admit: 2012-10-29 | Discharge: 2012-10-31 | Disposition: A | Discharge status: Home / Self Care | Payer: 59 | Source: Ambulatory Visit | Attending: Surgery | Admitting: Surgery

## 2012-10-29 ENCOUNTER — Encounter (HOSPITAL_COMMUNITY): Payer: Self-pay | Admitting: *Deleted

## 2012-10-29 ENCOUNTER — Ambulatory Visit (HOSPITAL_COMMUNITY): Payer: 59 | Admitting: Anesthesiology

## 2012-10-29 DIAGNOSIS — Z79899 Other long term (current) drug therapy: Secondary | ICD-10-CM | POA: Insufficient documentation

## 2012-10-29 DIAGNOSIS — K439 Ventral hernia without obstruction or gangrene: Secondary | ICD-10-CM

## 2012-10-29 DIAGNOSIS — G4733 Obstructive sleep apnea (adult) (pediatric): Secondary | ICD-10-CM | POA: Insufficient documentation

## 2012-10-29 DIAGNOSIS — N951 Menopausal and female climacteric states: Secondary | ICD-10-CM | POA: Insufficient documentation

## 2012-10-29 DIAGNOSIS — K4090 Unilateral inguinal hernia, without obstruction or gangrene, not specified as recurrent: Secondary | ICD-10-CM | POA: Insufficient documentation

## 2012-10-29 DIAGNOSIS — M62838 Other muscle spasm: Secondary | ICD-10-CM | POA: Insufficient documentation

## 2012-10-29 DIAGNOSIS — R109 Unspecified abdominal pain: Secondary | ICD-10-CM | POA: Insufficient documentation

## 2012-10-29 DIAGNOSIS — M7989 Other specified soft tissue disorders: Secondary | ICD-10-CM | POA: Insufficient documentation

## 2012-10-29 DIAGNOSIS — K219 Gastro-esophageal reflux disease without esophagitis: Secondary | ICD-10-CM | POA: Insufficient documentation

## 2012-10-29 DIAGNOSIS — E663 Overweight: Secondary | ICD-10-CM | POA: Insufficient documentation

## 2012-10-29 DIAGNOSIS — K5900 Constipation, unspecified: Secondary | ICD-10-CM | POA: Insufficient documentation

## 2012-10-29 DIAGNOSIS — E785 Hyperlipidemia, unspecified: Secondary | ICD-10-CM | POA: Insufficient documentation

## 2012-10-29 DIAGNOSIS — K436 Other and unspecified ventral hernia with obstruction, without gangrene: Principal | ICD-10-CM | POA: Insufficient documentation

## 2012-10-29 DIAGNOSIS — R339 Retention of urine, unspecified: Secondary | ICD-10-CM | POA: Insufficient documentation

## 2012-10-29 DIAGNOSIS — Z87440 Personal history of urinary (tract) infections: Secondary | ICD-10-CM | POA: Insufficient documentation

## 2012-10-29 DIAGNOSIS — Z85820 Personal history of malignant melanoma of skin: Secondary | ICD-10-CM | POA: Insufficient documentation

## 2012-10-29 HISTORY — PX: VENTRAL HERNIA REPAIR: SHX424

## 2012-10-29 SURGERY — REPAIR, HERNIA, VENTRAL, LAPAROSCOPIC
Anesthesia: General | Site: Abdomen | Wound class: Clean

## 2012-10-29 MED ORDER — HYDROCODONE-ACETAMINOPHEN 5-325 MG PO TABS
1.0000 | ORAL_TABLET | ORAL | Status: DC | PRN
  Administered 2012-10-29 – 2012-10-31 (×10): 1 via ORAL
  Filled 2012-10-29 (×11): qty 1

## 2012-10-29 MED ORDER — LABETALOL HCL 5 MG/ML IV SOLN
INTRAVENOUS | Status: DC | PRN
  Administered 2012-10-29: 5 mg via INTRAVENOUS

## 2012-10-29 MED ORDER — CHLORHEXIDINE GLUCONATE 4 % EX LIQD: 1.0000 "application " | Freq: Once | CUTANEOUS | Status: DC

## 2012-10-29 MED ORDER — LACTATED RINGERS IV SOLN
INTRAVENOUS | Status: DC | PRN
  Administered 2012-10-29 (×2): via INTRAVENOUS

## 2012-10-29 MED ORDER — PROPOFOL 10 MG/ML IV BOLUS
INTRAVENOUS | Status: DC | PRN
  Administered 2012-10-29: 200 mg via INTRAVENOUS

## 2012-10-29 MED ORDER — MORPHINE SULFATE 10 MG/ML IJ SOLN: 1.0000 mg | INTRAMUSCULAR | Status: DC | PRN

## 2012-10-29 MED ORDER — 0.9 % SODIUM CHLORIDE (POUR BTL) OPTIME
TOPICAL | Status: DC | PRN
  Administered 2012-10-29: 1000 mL

## 2012-10-29 MED ORDER — KETOROLAC TROMETHAMINE 30 MG/ML IJ SOLN
15.0000 mg | Freq: Once | INTRAMUSCULAR | Status: AC | PRN
  Administered 2012-10-29: 30 mg via INTRAVENOUS

## 2012-10-29 MED ORDER — SCOPOLAMINE 1 MG/3DAYS TD PT72
MEDICATED_PATCH | TRANSDERMAL | Status: AC
  Filled 2012-10-29: qty 1

## 2012-10-29 MED ORDER — CEFAZOLIN SODIUM-DEXTROSE 2-3 GM-% IV SOLR
INTRAVENOUS | Status: AC
  Filled 2012-10-29: qty 50

## 2012-10-29 MED ORDER — GLYCOPYRROLATE 0.2 MG/ML IJ SOLN
INTRAMUSCULAR | Status: DC | PRN
  Administered 2012-10-29: 0.4 mg via INTRAVENOUS

## 2012-10-29 MED ORDER — KCL IN DEXTROSE-NACL 20-5-0.45 MEQ/L-%-% IV SOLN
INTRAVENOUS | Status: DC
  Administered 2012-10-29 – 2012-10-31 (×4): via INTRAVENOUS
  Filled 2012-10-29 (×6): qty 1000

## 2012-10-29 MED ORDER — HYDROMORPHONE HCL PF 1 MG/ML IJ SOLN
0.2500 mg | INTRAMUSCULAR | Status: DC | PRN
  Administered 2012-10-29 (×4): 0.25 mg via INTRAVENOUS

## 2012-10-29 MED ORDER — BUPIVACAINE HCL 0.25 % IJ SOLN
INTRAMUSCULAR | Status: DC | PRN
  Administered 2012-10-29: 4 mL

## 2012-10-29 MED ORDER — CEFAZOLIN SODIUM-DEXTROSE 2-3 GM-% IV SOLR
2.0000 g | INTRAVENOUS | Status: AC
  Administered 2012-10-29: 2 g via INTRAVENOUS

## 2012-10-29 MED ORDER — BUPIVACAINE HCL (PF) 0.25 % IJ SOLN
INTRAMUSCULAR | Status: AC
  Filled 2012-10-29: qty 30

## 2012-10-29 MED ORDER — MIDAZOLAM HCL 5 MG/5ML IJ SOLN
INTRAMUSCULAR | Status: DC | PRN
  Administered 2012-10-29: 1 mg via INTRAVENOUS
  Administered 2012-10-29: 2 mg via INTRAVENOUS
  Administered 2012-10-29: 1 mg via INTRAVENOUS

## 2012-10-29 MED ORDER — ONDANSETRON HCL 4 MG/2ML IJ SOLN
INTRAMUSCULAR | Status: DC | PRN
  Administered 2012-10-29: 4 mg via INTRAVENOUS

## 2012-10-29 MED ORDER — LIDOCAINE HCL (CARDIAC) 20 MG/ML IV SOLN
INTRAVENOUS | Status: DC | PRN
  Administered 2012-10-29: 50 mg via INTRAVENOUS

## 2012-10-29 MED ORDER — HYDROCODONE-ACETAMINOPHEN 5-325 MG PO TABS: 1.0000 | ORAL_TABLET | Freq: Four times a day (QID) | ORAL | Status: DC | PRN

## 2012-10-29 MED ORDER — SCOPOLAMINE 1 MG/3DAYS TD PT72
MEDICATED_PATCH | TRANSDERMAL | Status: DC | PRN
  Administered 2012-10-29: 1 via TRANSDERMAL

## 2012-10-29 MED ORDER — METOCLOPRAMIDE HCL 5 MG/ML IJ SOLN
INTRAMUSCULAR | Status: DC | PRN
  Administered 2012-10-29: 10 mg via INTRAVENOUS

## 2012-10-29 MED ORDER — PROMETHAZINE HCL 25 MG/ML IJ SOLN: 6.2500 mg | INTRAMUSCULAR | Status: DC | PRN

## 2012-10-29 MED ORDER — HYDROMORPHONE HCL PF 1 MG/ML IJ SOLN
INTRAMUSCULAR | Status: AC
  Filled 2012-10-29: qty 1

## 2012-10-29 MED ORDER — NEOSTIGMINE METHYLSULFATE 1 MG/ML IJ SOLN
INTRAMUSCULAR | Status: DC | PRN
  Administered 2012-10-29: 3 mg via INTRAVENOUS

## 2012-10-29 MED ORDER — SUFENTANIL CITRATE 50 MCG/ML IV SOLN
INTRAVENOUS | Status: DC | PRN
  Administered 2012-10-29: 20 ug via INTRAVENOUS
  Administered 2012-10-29: 5 ug via INTRAVENOUS
  Administered 2012-10-29 (×4): 10 ug via INTRAVENOUS

## 2012-10-29 MED ORDER — KETOROLAC TROMETHAMINE 30 MG/ML IJ SOLN
INTRAMUSCULAR | Status: AC
  Filled 2012-10-29: qty 1

## 2012-10-29 MED ORDER — SUCCINYLCHOLINE CHLORIDE 20 MG/ML IJ SOLN
INTRAMUSCULAR | Status: DC | PRN
  Administered 2012-10-29: 100 mg via INTRAVENOUS

## 2012-10-29 MED ORDER — ROCURONIUM BROMIDE 100 MG/10ML IV SOLN
INTRAVENOUS | Status: DC | PRN
  Administered 2012-10-29: 5 mg via INTRAVENOUS
  Administered 2012-10-29: 40 mg via INTRAVENOUS

## 2012-10-29 MED ORDER — DEXAMETHASONE SODIUM PHOSPHATE 10 MG/ML IJ SOLN
INTRAMUSCULAR | Status: DC | PRN
  Administered 2012-10-29: 10 mg via INTRAVENOUS

## 2012-10-29 MED ORDER — HYDROCODONE-ACETAMINOPHEN 5-325 MG PO TABS
2.0000 | ORAL_TABLET | Freq: Once | ORAL | Status: AC
  Administered 2012-10-29: 1 via ORAL
  Filled 2012-10-29: qty 1

## 2012-10-29 MED ORDER — PROPOFOL INFUSION 10 MG/ML OPTIME
INTRAVENOUS | Status: DC | PRN
  Administered 2012-10-29: 160 ug/kg/min via INTRAVENOUS

## 2012-10-29 SURGICAL SUPPLY — 47 items
BENZOIN TINCTURE PRP APPL 2/3 (GAUZE/BANDAGES/DRESSINGS) IMPLANT
BINDER ABD UNIV 12 45-62 (WOUND CARE) ×1 IMPLANT
BINDER ABDOMINAL 46IN 62IN (WOUND CARE) ×2
CANISTER SUCTION 2500CC (MISCELLANEOUS) ×2 IMPLANT
CHLORAPREP W/TINT 26ML (MISCELLANEOUS) ×2 IMPLANT
CLOTH BEACON ORANGE TIMEOUT ST (SAFETY) ×2 IMPLANT
DECANTER SPIKE VIAL GLASS SM (MISCELLANEOUS) IMPLANT
DERMABOND ADVANCED (GAUZE/BANDAGES/DRESSINGS)
DERMABOND ADVANCED .7 DNX12 (GAUZE/BANDAGES/DRESSINGS) IMPLANT
DEVICE SECURE STRAP 25 ABSORB (INSTRUMENTS) ×2 IMPLANT
DEVICE TROCAR PUNCTURE CLOSURE (ENDOMECHANICALS) ×2 IMPLANT
DISSECTOR BLUNT TIP ENDO 5MM (MISCELLANEOUS) IMPLANT
DRAIN CHANNEL RND F F (WOUND CARE) IMPLANT
DRAPE INCISE IOBAN 66X45 STRL (DRAPES) ×2 IMPLANT
DRAPE LAPAROSCOPIC ABDOMINAL (DRAPES) ×2 IMPLANT
DRAPE UTILITY XL STRL (DRAPES) ×2 IMPLANT
ELECT REM PT RETURN 9FT ADLT (ELECTROSURGICAL) ×2
ELECTRODE REM PT RTRN 9FT ADLT (ELECTROSURGICAL) ×1 IMPLANT
EVACUATOR SILICONE 100CC (DRAIN) IMPLANT
GLOVE BIOGEL PI IND STRL 7.0 (GLOVE) ×1 IMPLANT
GLOVE BIOGEL PI INDICATOR 7.0 (GLOVE) ×1
GLOVE SURG SIGNA 7.5 PF LTX (GLOVE) ×2 IMPLANT
GOWN STRL NON-REIN LRG LVL3 (GOWN DISPOSABLE) IMPLANT
GOWN STRL REIN XL XLG (GOWN DISPOSABLE) ×6 IMPLANT
KIT BASIN OR (CUSTOM PROCEDURE TRAY) ×2 IMPLANT
MARKER SKIN DUAL TIP RULER LAB (MISCELLANEOUS) ×2 IMPLANT
MESH PARIETEX 4.7 (Mesh General) ×2 IMPLANT
NEEDLE INSUFFLATION 14GA 150MM (NEEDLE) IMPLANT
NEEDLE SPNL 22GX3.5 QUINCKE BK (NEEDLE) ×2 IMPLANT
NS IRRIG 1000ML POUR BTL (IV SOLUTION) ×2 IMPLANT
PENCIL BUTTON HOLSTER BLD 10FT (ELECTRODE) IMPLANT
SCALPEL HARMONIC ACE (MISCELLANEOUS) ×2 IMPLANT
SCISSORS LAP 5X35 DISP (ENDOMECHANICALS) ×2 IMPLANT
SET IRRIG TUBING LAPAROSCOPIC (IRRIGATION / IRRIGATOR) IMPLANT
SOLUTION ANTI FOG 6CC (MISCELLANEOUS) ×2 IMPLANT
STAPLER VISISTAT 35W (STAPLE) IMPLANT
STRIP CLOSURE SKIN 1/4X4 (GAUZE/BANDAGES/DRESSINGS) IMPLANT
SUT NOVA 0 T19/GS 22DT (SUTURE) IMPLANT
SUT VIC AB 5-0 PS2 18 (SUTURE) ×2 IMPLANT
TACKER 5MM HERNIA 3.5CML NAB (ENDOMECHANICALS) IMPLANT
TOWEL OR 17X26 10 PK STRL BLUE (TOWEL DISPOSABLE) ×2 IMPLANT
TOWEL OR NON WOVEN STRL DISP B (DISPOSABLE) ×2 IMPLANT
TRAY FOLEY CATH 14FRSI W/METER (CATHETERS) IMPLANT
TRAY LAP CHOLE (CUSTOM PROCEDURE TRAY) ×2 IMPLANT
TROCAR BLADELESS OPT 5 75 (ENDOMECHANICALS) ×4 IMPLANT
TROCAR XCEL NON-BLD 11X100MML (ENDOMECHANICALS) IMPLANT
TUBING INSUFFLATION 10FT LAP (TUBING) ×2 IMPLANT

## 2012-10-29 NOTE — Progress Notes (Signed)
Patient with pain 3/10 when lying in bed but when moving pain increases. Called MD for po pain med. Order received.

## 2012-10-29 NOTE — Progress Notes (Signed)
Patient being admitted to room 1533. Report called to [facility].

## 2012-10-29 NOTE — Anesthesia Postprocedure Evaluation (Signed)
  Anesthesia Post-op Note  Patient: Jennifer Sharp  Procedure(s) Performed: Procedure(s) (LRB): LAPAROSCOPIC possible open VENTRAL HERNIA (N/A)  Patient Location: PACU  Anesthesia Type: General  Level of Consciousness: awake and alert   Airway and Oxygen Therapy: Patient Spontanous Breathing  Post-op Pain: mild  Post-op Assessment: Post-op Vital signs reviewed, Patient's Cardiovascular Status Stable, Respiratory Function Stable, Patent Airway and No signs of Nausea or vomiting  Last Vitals:  Filed Vitals:   10/29/12 1230  BP: 124/72  Pulse: 58  Temp:   Resp: 14    Post-op Vital Signs: stable   Complications: No apparent anesthesia complications

## 2012-10-29 NOTE — Transfer of Care (Signed)
Immediate Anesthesia Transfer of Care Note  Patient: Jennifer Sharp  Procedure(s) Performed: Procedure(s): LAPAROSCOPIC possible open VENTRAL HERNIA (N/A)  Patient Location: PACU  Anesthesia Type:General  Level of Consciousness: awake, oriented and patient cooperative  Airway & Oxygen Therapy: Patient Spontanous Breathing and Patient connected to face mask oxygen  Post-op Assessment: Report given to PACU RN, Post -op Vital signs reviewed and stable and Patient moving all extremities X 4  Post vital signs: stable  Complications: No apparent anesthesia complications

## 2012-10-29 NOTE — Op Note (Signed)
OPERATIVE NOTE  10/29/2012  11:19 AM  PATIENT:  Jennifer Sharp, 52 y.o., female, MRN: [id number]  PREOP DIAGNOSIS:  ventral (epigastric) hernia, incarcerated  POSTOP DIAGNOSIS:   ventral (epigastric) hernia, incarcerated, small indirect right inguinal hernia  PROCEDURE:   Procedure(s):LAPAROSCOPIC VENTRAL HERNIA with 12 cm x 12 cm Parietex mesh  SURGEON:   Ovidio Kin, M.D.  ASSISTANT:   None  ANESTHESIA:   general  Anesthesiologist: Eilene Ghazi, MD CRNA: Illene Silver, CRNA  General  EBL:  Minimal  ml  BLOOD ADMINISTERED: none  DRAINS: none   LOCAL MEDICATIONS USED:   10 cc 1/4 % marcaine  SPECIMEN:   None  COUNTS CORRECT:  YES  INDICATIONS FOR PROCEDURE:  Jennifer Sharp is a 52 y.o. (DOB: 18-Feb-1961) white  female whose primary care physician is Danise Edge, MD and comes for repair of ventral (epigastric) hernia   The indications and risks of the surgery were explained to the patient.  The risks include, but are not limited to, infection, bleeding, and nerve injury.  OPERATIVE NOTE:  The patient was taken to room 6 at [facility].  She underwent a general anesthesia.  She was given 2 grams of Ancef at the beginning of the operation.   A time out was held and the surgical checklist run.   The abdomen was prepped with Cholroprep and sterilely draped.  I covered the abdomen with a Ioban drape.   I accessed the LUQ with a 5 mm trocar.  An additional 5 mm trocar was placed in the left mid abdomen and a 5 mm trocar in the right upper quadrant.  The LUQ trocar was converted to a 11 mm trocar to insert the mesh.   I did an abdominal exploration.  Her liver, stomach, and bowel that I could see was normal.  She does have a small right inguinal hernia.  I took photos and put them in the chart. Her left inguinal floor looked okay.   She had a 2 cm defect with incarcerated fat about 5 cm above the umbilicus.  There was some preperitoneal fat incarcerated in the defect.  The defect  was at the end of the falciform ligament.  I took the falciform ligament down going about 8 cm cranially.   I closed the defect with 1 Novafil.   Then I placed a 12 x 12 cm Parietex mesh in the abdomen.  It had 4 holding sutures of 0 Novafil.  These were tied down to secure the mesh to the anterior abdominal wall.  I then used 30 Securestrap tacks to tack the mesh to the anterior abdominal wall.   The abdomen was deflated.  The mesh was inspected and there were no defects around the edge.   The trocars were then removed.  There was no bleeding at the trocar sites.  The incisions were closed with 5-0 Monocryl and painted with Dermabond. The puncture sites were closed with Steristrips.   The sponge and needle count were correct.  The patient was transferred to the recovery room in good condition.  She wants to try to go home today.   Type of repair - mesh  (choices - primary suture, mesh, or component)  Name of mesh - Parietex  Size of mesh - Length 12 cm, Width 12 cm  Mesh overlap - 5 cm  Placement of mesh - beneath fascia and into peritoneal cavity.  (choices - beneath fascia and into peritoneal cavity, beneath fascia but external to peritoneal  cavity, between the muscle and fascia, above or external to fascia)  Ovidio Kin, MD, [facility] Pager: [phone] Office phone:  [phone]

## 2012-10-29 NOTE — Anesthesia Preprocedure Evaluation (Addendum)
Anesthesia Evaluation  Patient identified by MRN, date of birth, ID band Patient awake    Reviewed: Allergy & Precautions, H&P , NPO status , Patient's Chart, lab work & pertinent test results  History of Anesthesia Complications (+) PONV  Airway Mallampati: III TM Distance: <3 FB Neck ROM: Full    Dental no notable dental hx.    Pulmonary sleep apnea and Continuous Positive Airway Pressure Ventilation ,  breath sounds clear to auscultation  Pulmonary exam normal       Cardiovascular negative cardio ROS  Rhythm:Regular Rate:Normal     Neuro/Psych negative neurological ROS  negative psych ROS   GI/Hepatic Neg liver ROS, GERD-  Medicated,  Endo/Other  negative endocrine ROS  Renal/GU negative Renal ROS  negative genitourinary   Musculoskeletal negative musculoskeletal ROS (+)   Abdominal   Peds negative pediatric ROS (+)  Hematology negative hematology ROS (+)   Anesthesia Other Findings   Reproductive/Obstetrics negative OB ROS                          Anesthesia Physical Anesthesia Plan  ASA: II  Anesthesia Plan: General   Post-op Pain Management:    Induction: Intravenous  Airway Management Planned: Oral ETT  Additional Equipment:   Intra-op Plan:   Post-operative Plan: Extubation in OR  Informed Consent: I have reviewed the patients History and Physical, chart, labs and discussed the procedure including the risks, benefits and alternatives for the proposed anesthesia with the patient or authorized representative who has indicated his/her understanding and acceptance.   Dental advisory given  Plan Discussed with: CRNA and Surgeon  Anesthesia Plan Comments:         Anesthesia Quick Evaluation

## 2012-10-29 NOTE — Progress Notes (Signed)
NOS  She is having more pain than expected.  Her husband is in the room.  Admitted to observation. Will leave on clear liquids for now. Has about 400 cc of urin in bladder - will try to void on her own.  BP 119/68  Pulse 67  Temp(Src) 97.8 F (36.6 C) (Oral)  Resp 18  SpO2 96%  Post lap ventral hernia.  Ovidio Kin, MD, [facility] Pager: [phone] Office phone:  [phone]

## 2012-10-29 NOTE — H&P (Signed)
Re: Jennifer Sharp  DOB: Sep 24, 1960  MRN: [id number]   ASSESSMENT AND PLAN:  1. Ventral hernia (probably not umbilical)   I discussed the indications and complications of hernia surgery with the patient. I discussed both the laparoscopic and open approach to hernia repair.. The potential risks of hernia surgery include, but are not limited to, bleeding, infection, open surgery, nerve injury, and recurrence of the hernia. I provided the patient literature about hernia surgery.   I think it depends on the fascial defect as to whether I will repair this open vs closed. I discussed both and lean towards a laparoscopic repair at this time.   Her husband, Jennifer Sharp, is with her today.    2. History of left inguinal hernia repair, open - 02/26/2007 - Jennifer Sharp  3. History of melanoma - right arm. And premelanoma excised from left thigh.  4. Hyperlipidemia  5. OSA on CPAP since 2013.   She's happy with this.  6. Mild swelling of her ankles.   Recently started on Align (a probiotic)  7. History of constipation.  8. GERD  9. Peri-menopausal symptoms   No chief complaint on file.   REFERRING PHYSICIAN: Danise Edge, MD   HISTORY OF PRESENT ILLNESS:  Jennifer Sharp is a 52 y.o. (DOB: 1960-10-20) white female whose primary care physician is Jennifer Edge, MD and comes to me today for ventral/umbilical hernia.  I saw her in 2011 for the same hernia. At that time, she felt the hernia, but it do not bother her much. But she has now noticed an increase in size of the hernia/mass. And she is having more discomfort with the hernia.  Her prior abdominal surgery was laparoscopy for fertility around 1993. She was noted to have a torted left ovary. And an open left inguinal hernia in 2009.   Past Medical History   Diagnosis  Date   .  Allergy      seasonal- occasionally   .  Melanoma  2006     on right arm   .  Melanoma    .  Hyperlipidemia    .  Premenstrual migraine    .  Edema    .  Fatigue  07/25/2010    .  Overweight(278.02)  07/25/2010   .  Reflux  07/25/2010   .  Colon polyps  07/25/2010   .  History of recurrent UTIs  07/25/2010   .  Schatzki's ring  07/25/2010   .  Umbilical hernia  07/25/2010   .  Preventative health care  06/09/2011   .  Candidiasis of skin  06/10/2011   .  Tachycardia      H/O transient tach. as a chlid   .  OSA on CPAP  10/02/2011   .  Unspecified constipation  07/19/2012    Past Surgical History   Procedure  Laterality  Date   .  Oral surgeries   70's and 80's   .  Dilation and curettage of uterus   [phone]   .  Melanoma spot on right arm removed   2006   .  Tonsillectomy       childhood   .  Hernia repair       left inguinal    Current Outpatient Prescriptions   Medication  Sig  Dispense  Refill   .  calcium carbonate (OS-CAL) 600 MG TABS  Take 600 mg by mouth daily.     Marland Kitchen  esomeprazole (NEXIUM) 40 MG capsule  Take 40 mg by mouth as needed. Takes occasionally     .  levonorgestrel-ethinyl estradiol (AVIANE,ALESSE,LESSINA) 0.1-20 MG-MCG tablet  Take 1 tablet by mouth daily. Active pills only  4 Package  3   .  Omega-3 Fatty Acids (FISH OIL PO)  Take by mouth.     .  pantoprazole (PROTONIX) 40 MG tablet  Take 1 tablet (40 mg total) by mouth daily.  90 tablet  3   .  ranitidine (ZANTAC) 300 MG tablet  TAKE 1 TABLET AT BEDTIME. TRY TAKING 1 TAB EVERY OTHER DAY ALTERNATING WITH NEXIUM, BUT DAILY IF NEEDED  30 tablet  0   .  methylphenidate (RITALIN) 10 MG tablet  Take 1 tablet (10 mg total) by mouth daily.  30 tablet  0    No current facility-administered medications for this visit.   No Known Allergies   REVIEW OF SYSTEMS:  Skin: History of melanoma.  Infection: No history of hepatitis or HIV. No history of MRSA.  Neurologic: No history of stroke. No history of seizure. No history of headaches.  Cardiac: No history of hypertension. No history of heart disease. No history of seeing a cardiologist.  Pulmonary: OSA on CPAP x 1 year.  Endocrine: No diabetes. No  thyroid disease.  Gastrointestinal: GERD requiring PPI. No history of liver disease. No history of gall bladder disease. No history of pancreas disease. Family history of colon cancer. Last colonoscopy by Dr. Clent Sharp - 2011.  Urologic: No history of kidney stones. No history of bladder infections.  GYN: 3 miscarriages. 2 children. Sees Dr. Laurel Sharp.  Musculoskeletal: No history of joint or back disease.  Hematologic: No bleeding disorder. No history of anemia. Not anticoagulated.  Psycho-social: The patient is oriented. The patient has no obvious psychologic or social impairment to understanding our conversation and plan.   SOCIAL and FAMILY HISTORY:  Married.  Has 2 children: 19 and 14.  Works at [employer]   PHYSICAL EXAM:  BP 132/89  Pulse 99  Temp(Src) 96.9 F (36.1 C) (Oral)  Resp 16  SpO2 100%  Ht 5' 6.5" (1.689 m)  Wt 166 lb 9.6 oz (75.569 kg)  BMI 26.49 kg/m2   General: WN WF who is alert and generally healthy appearing.  HEENT: Normal. Pupils equal.  Neck: Supple. No mass. No thyroid mass.  Lymph Nodes: No supraclavicular or cervical nodes.  Lungs: Clear to auscultation and symmetric breath sounds.  Heart: RRR. No murmur or rub.  Abdomen: Soft. No mass. No tenderness. No hernia. Normal bowel sounds. 3 - 4 cm bulge, just above the umbilicus. Not really reducible. 3 cm scar RUQ from prior skin lesion excised. Left inguinal scar.  Rectal: Not done.  Extremities: Good strength and ROM in upper and lower extremities.  Neurologic: Grossly intact to motor and sensory function.  Psychiatric: Has normal mood and affect. Behavior is normal.   DATA REVIEWED:  Notes in Epic.  Jennifer Kin, MD, [facility]  [address] [place]  [place], [place] [address]  Phone: [phone] FAX: [phone]

## 2012-10-29 NOTE — Progress Notes (Signed)
Bladder scan 409cc. Paged Dr D. Newman.

## 2012-10-29 NOTE — Progress Notes (Signed)
Notified Dr Ezzard Standing of pts staus, rec. Orders for admission.

## 2012-10-29 NOTE — Progress Notes (Signed)
Pt transferred to room 1533. Abdominal dressings CDI. Husband at bedside.

## 2012-10-29 NOTE — Progress Notes (Signed)
Pt has home CPAP for use tonight, RT inspected for damage and frayed cords.  Machine is in working order, Biomed to inspect in the am.  RT to monitor and assess as needed.

## 2012-10-29 NOTE — Progress Notes (Signed)
Per Dr Okey Dupre, anestology, no pregnancy test needed

## 2012-10-30 ENCOUNTER — Encounter (HOSPITAL_COMMUNITY): Payer: Self-pay | Admitting: Surgery

## 2012-10-30 MED ORDER — NON FORMULARY: 40.0000 mg | Freq: Every day | Status: DC

## 2012-10-30 MED ORDER — ESOMEPRAZOLE MAGNESIUM 40 MG PO CPDR
40.0000 mg | DELAYED_RELEASE_CAPSULE | Freq: Every day | ORAL | Status: DC
  Administered 2012-10-30: 40 mg via ORAL
  Filled 2012-10-30 (×2): qty 1

## 2012-10-30 MED ORDER — CYCLOBENZAPRINE HCL 5 MG PO TABS
5.0000 mg | ORAL_TABLET | Freq: Three times a day (TID) | ORAL | Status: DC | PRN
  Administered 2012-10-30 (×2): 5 mg via ORAL
  Administered 2012-10-30: 10 mg via ORAL
  Filled 2012-10-30 (×3): qty 1

## 2012-10-30 MED ORDER — HEPARIN SODIUM (PORCINE) 5000 UNIT/ML IJ SOLN
5000.0000 [IU] | Freq: Three times a day (TID) | INTRAMUSCULAR | Status: DC
  Administered 2012-10-30 – 2012-10-31 (×4): 5000 [IU] via SUBCUTANEOUS
  Filled 2012-10-30 (×7): qty 1

## 2012-10-30 NOTE — Progress Notes (Signed)
While completing admission, was going over IS use and pt stated that she had been taking deep breaths and wanted to wait until later to go over IS use.

## 2012-10-30 NOTE — Progress Notes (Signed)
Telephone order received from MD Ezzard Standing for Flexeril 5mg  three times a day as needed Stanford Breed RN 10-30-2012 13:20pm

## 2012-10-30 NOTE — Progress Notes (Signed)
General Surgery Note  LOS: 1 day  POD -  1 Day Post-Op  Assessment/Plan: 1.  LAPAROSCOPIC VENTRAL HERNIA - 10/30/2011 - D. Shanquita Ronning  Very sore, but better than last PM  Not ready to go home  Will leave on clear liquids.  Needs to get up and walk  2.  Small asymptomatic right inguinal hernia seen at time of laparoscopy. 3.  DVT prophylaxis - SQ Heparin 4.  Hyperlipidemia  5.  OSA on CPAP since 2013.  6.  Mild swelling of her ankles.   Recently started on Align (a probiotic)  7.  History of constipation.  8.  GERD  9.  Peri-menopausal symptoms 10. History of melanoma - right arm. 11.  Urinary retention - cathed last pm for 1,000 cc  Has not voided this AM yet.  Subjective:  Still very sore, but a little better.  Has not voided.  Husband in room.  Not ready to go home.  Objective:   Filed Vitals:   10/30/12 0618  BP: 107/72  Pulse: 70  Temp: 98.3 F (36.8 C)  Resp: 16     Intake/Output from previous day:  09/11 0701 - 09/12 0700 In: 2893.3 [P.O.:260; I.V.:2633.3] Out: 1050 [Urine:1050]  Intake/Output this shift:      Physical Exam:   General: WN WF who is alert and oriented.    HEENT: Normal. Pupils equal. .   Lungs: Clear   Abdomen: Soft, but quiet.   Wound: Look clean   Lab Results:   No results found for this basename: WBC, HGB, HCT, PLT,  in the last 72 hours  BMET  No results found for this basename: NA, K, CL, CO2, GLUCOSE, BUN, CREATININE, CALCIUM,  in the last 72 hours  PT/INR  No results found for this basename: LABPROT, INR,  in the last 72 hours  ABG  No results found for this basename: PHART, PCO2, PO2, HCO3,  in the last 72 hours   Studies/Results:  No results found.   Anti-infectives:   Anti-infectives   Start     Dose/Rate Route Frequency Ordered Stop   10/29/12 0852  ceFAZolin (ANCEF) IVPB 2 g/50 mL premix     2 g 100 mL/hr over 30 Minutes Intravenous On call to O.R. 10/29/12 0852 10/29/12 1007      Ovidio Kin, MD, FACS Pager:  [phone],   [facility] [place] [facility] Office: [phone] 10/30/2012

## 2012-10-30 NOTE — Progress Notes (Signed)
Patient has home CPAP unit at bedside. Humidifier filled to max fill line with sterile water. Unit is plugged into red power outlet. Patient states she can place herself on when she is ready for sleep. She is aware that RT is available if she should need further assistance.

## 2012-10-31 MED ORDER — CYCLOBENZAPRINE HCL 10 MG PO TABS: 10.0000 mg | ORAL_TABLET | Freq: Three times a day (TID) | ORAL | Status: DC | PRN

## 2012-10-31 MED ORDER — CYCLOBENZAPRINE HCL 10 MG PO TABS
10.0000 mg | ORAL_TABLET | Freq: Three times a day (TID) | ORAL | Status: DC | PRN
  Administered 2012-10-31: 10 mg via ORAL
  Filled 2012-10-31 (×2): qty 1

## 2012-10-31 NOTE — Discharge Summary (Signed)
Physician Discharge Summary  Patient ID:  Jennifer Sharp  MRN: [id number]  DOB/AGE: [birth year] 52 y.o.  Admit date: 10/29/2012 Discharge date: 10/31/2012  Discharge Diagnoses:  1. VENTRAL HERNIA with incarcerated fat 2. Small asymptomatic right inguinal hernia seen at time of laparoscopy.  3. Urinary retention - resolved 4. Hyperlipidemia  5. OSA on CPAP since 2013.  6. Mild swelling of her ankles.   Recently started on Align (a probiotic)  7. History of constipation.  8. GERD  9. Peri-menopausal symptoms  10. History of melanoma - right arm.   Operation: Procedure(s): LAPAROSCOPIC VENTRAL HERNIA WITH MESH on 10/29/2012 - D. Ezzard Standing   Discharged Condition: good  Hospital Course: Jennifer Sharp is an 52 y.o. female whose primary care physician is Danise Edge, MD and who was admitted 10/29/2012 with a chief complaint of VENTRAL HERNIA.   She was brought to the operating room on 10/29/2012 and underwent  LAPAROSCOPIC VENTRAL HERNIA WITH MESH.   Post op she had a lot of trouble with abdominal pain and spasm.  She also had urinary retention requiring I&O cath.  I started her on Flexeril the first post op day and tis seemed to help the abdominal spasms.  She is two days post op.  Her pain and spasms are better.  She is voiding.  She is ready for discharge.  The discharge instructions were reviewed with the patient.  Consults: None  Significant Diagnostic Studies: Results for orders placed during the hospital encounter of 10/20/12  CBC      Result Value Range   WBC 9.0  4.0 - 10.5 K/uL   RBC 4.76  3.87 - 5.11 MIL/uL   Hemoglobin 14.7  12.0 - 15.0 g/dL   HCT 40.9  81.1 - 91.4 %   MCV 92.0  78.0 - 100.0 fL   MCH 30.9  26.0 - 34.0 pg   MCHC 33.6  30.0 - 36.0 g/dL   RDW 78.2  95.6 - 21.3 %   Platelets 340  150 - 400 K/uL    No results found.  Discharge Exam:  Filed Vitals:   10/31/12 0500  BP: 115/77  Pulse: 76  Temp: 97.8 F (36.6 C)  Resp: 18    General: WN WF who  is alert and generally healthy appearing.  Lungs: Clear to auscultation and symmetric breath sounds. Heart:  RRR. No murmur or rub. Abdomen: Soft. No mass. Incisions looks good.  Discharge Medications:     Medication List         ALIGN PO  Take 1 tablet by mouth daily.     calcium carbonate 600 MG Tabs tablet  Commonly known as:  OS-CAL  Take 600 mg by mouth daily.     cyclobenzaprine 10 MG tablet  Commonly known as:  FLEXERIL  Take 1 tablet (10 mg total) by mouth 3 (three) times daily as needed for muscle spasms.     docusate sodium 100 MG capsule  Commonly known as:  COLACE  Take 100 mg by mouth daily as needed for constipation.     econazole nitrate 1 % cream  Apply 1 application topically daily as needed (for itching).     esomeprazole 40 MG capsule  Commonly known as:  NEXIUM  Take 40 mg by mouth as directed. Alternates with pantoprazole and zantac     FISH OIL PO  Take 1 capsule by mouth daily. Omega Red     HYDROcodone-acetaminophen 5-325 MG per tablet  Commonly known as:  NORCO/VICODIN  Take 1-2 tablets by mouth every 6 (six) hours as needed for pain.     hydrocortisone valerate cream 0.2 %  Commonly known as:  WESTCORT  Apply 1 application topically 2 (two) times daily as needed (for itching).     ibuprofen 200 MG tablet  Commonly known as:  ADVIL,MOTRIN  Take 400-600 mg by mouth every 6 (six) hours as needed for pain.     levonorgestrel-ethinyl estradiol 0.1-20 MG-MCG tablet  Commonly known as:  AVIANE,ALESSE,LESSINA  Take 1 tablet by mouth daily. Take only active pills     pantoprazole 40 MG tablet  Commonly known as:  PROTONIX  Take 40 mg by mouth as directed. Alternates with nexium and ranitidine     ranitidine 300 MG tablet  Commonly known as:  ZANTAC  Take 300 mg by mouth as directed. Alternates with nexium and pantoprazole       Disposition: 01-Home or Self Care      Discharge Orders   Future Orders Complete By Expires   Diet - low  sodium heart healthy  As directed    Discharge patient  As directed    Increase activity slowly  As directed       Follow-up Information   Follow up with [facility], MD. Schedule an appointment as soon as possible for a visit in 2 weeks.   Specialty:  General Surgery   Contact information:   [address] [place] [address] [phone]      Discharge Instructions: Return to work on:  About 2 weeks  Activity:  Driving - No driving for 3 to 4 days, then may drive, if doing well.   Lifting - No lifting greater than 15 pounds for 4 weeks.  Wound Care:   May remove bandages and shower Saturday, 10/31/2012  Diet:  As tolerated  Follow up appointment:  Call Dr. Allene Pyo office [facility]) at [phone] for an appointment in 2 weeks.  Medications and dosages:  Resume your home medications.  You have a prescription for:  Vicodin and Flexeril  Signed: Ovidio Kin, M.D., [facility] Office:  [phone]  10/31/2012, 12:31 PM

## 2012-10-31 NOTE — Progress Notes (Signed)
Patient ID: Jennifer Sharp, female   DOB: April 26, 1960, 52 y.o.   MRN: [id number] 2 Days Post-Op  Subjective: Feels much better today. Flexeril seems to control her muscle spasms. She is voiding and feels her bladder is emptying. She feels ready to go home.  Objective: Vital signs in last 24 hours: Temp:  [97.8 F (36.6 C)-98 F (36.7 C)] 97.8 F (36.6 C) (09/13 0500) Pulse Rate:  [67-83] 76 (09/13 0500) Resp:  [16-18] 18 (09/13 0500) BP: (108-115)/(72-77) 115/77 mmHg (09/13 0500) SpO2:  [93 %-95 %] 94 % (09/13 0500) Last BM Date: 10/28/12  Intake/Output from previous day: 09/12 0701 - 09/13 0700 In: 3160 [P.O.:810; I.V.:2350] Out: 3900 [Urine:3900] Intake/Output this shift:    General appearance: alert, cooperative and no distress GI: abnormal findings:  moderate tenderness in the entire abdomen Incision/Wound: clean and dry  Lab Results:  No results found for this basename: WBC, HGB, HCT, PLT,  in the last 72 hours BMET No results found for this basename: NA, K, CL, CO2, GLUCOSE, BUN, CREATININE, CALCIUM,  in the last 72 hours   Studies/Results: No results found.  Anti-infectives: Anti-infectives   Start     Dose/Rate Route Frequency Ordered Stop   10/29/12 0852  ceFAZolin (ANCEF) IVPB 2 g/50 mL premix     2 g 100 mL/hr over 30 Minutes Intravenous On call to O.R. 10/29/12 0852 10/29/12 1007      Assessment/Plan: s/p Procedure(s): LAPAROSCOPIC VENTRAL HERNIA WITH MESH Much improved today. Ready to go home. Will add Flexeril to her postoperative regimen.   LOS: 2 days    Chevon Laufer T 10/31/2012

## 2012-10-31 NOTE — Progress Notes (Signed)
Pt discharged to home. DC INSTRUCTIONS given with husband at bedside. Prescription x 1 given for flexeril. Pt reports already having prescription for pain med. Left unit in wheelchair pushed by this rn. Nurse tech completed pt's transfer to car. Left unit in good condition. No concerns voiced. Vwilliams,rn.

## 2012-10-31 NOTE — Plan of Care (Signed)
Problem: Phase I Progression Outcomes Goal: Incision/dressings dry and intact

## 2012-11-12 ENCOUNTER — Ambulatory Visit (INDEPENDENT_AMBULATORY_CARE_PROVIDER_SITE_OTHER): Payer: Commercial Managed Care - PPO | Admitting: Surgery

## 2012-11-12 ENCOUNTER — Encounter (INDEPENDENT_AMBULATORY_CARE_PROVIDER_SITE_OTHER): Payer: Self-pay | Admitting: Surgery

## 2012-11-12 VITALS — BP 128/80 | HR 80 | Temp 97.6°F | Resp 14 | Ht 66.5 in | Wt 160.0 lb

## 2012-11-12 DIAGNOSIS — K439 Ventral hernia without obstruction or gangrene: Secondary | ICD-10-CM

## 2012-11-12 NOTE — Progress Notes (Signed)
POV  Still very sore - but moving better.  Some constipation.  BP 128/80  Pulse 80  Temp(Src) 97.6 F (36.4 C) (Temporal)  Resp 14  Ht 5' 6.5" (1.689 m)  Wt 160 lb (72.576 kg)  BMI 25.44 kg/m2  Wounds look good.  BS present. Sore.  A&P: 1.  Ventral hernia repair - 10/29/2012  Still sore, but getting better.  I wrote her two notes for work - one for 11/26/2012 and one for 12/04/2102.  I'll let her choose when to return to work.  1a.  She had a small right inguinal hernia seen at laparoscopy.  We talked about that for a while.  2. History of left inguinal hernia repair, open - 02/26/2007 - D. Caela Huot  3. History of melanoma - right arm. And premelanoma excised from left thigh.  4. Hyperlipidemia  5. OSA on CPAP since 2013.   She's happy with this.  6. Mild swelling of her ankles.   Recently started on Align (a probiotic)  7. History of constipation.  8. GERD  9. Peri-menopausal symptoms  Ovidio Kin, MD, [facility] Pager: [phone] Office phone:  [phone]

## 2012-12-24 ENCOUNTER — Other Ambulatory Visit: Payer: Self-pay

## 2012-12-28 ENCOUNTER — Other Ambulatory Visit: Payer: Self-pay | Admitting: Family Medicine

## 2013-01-04 ENCOUNTER — Other Ambulatory Visit: Payer: Self-pay | Admitting: *Deleted

## 2013-01-04 MED ORDER — RANITIDINE HCL 300 MG PO TABS: ORAL_TABLET | ORAL | Status: DC

## 2013-01-04 NOTE — Telephone Encounter (Signed)
Rx request to pharmacy/SLS  

## 2013-01-26 ENCOUNTER — Other Ambulatory Visit: Payer: Self-pay | Admitting: Obstetrics and Gynecology

## 2013-01-26 DIAGNOSIS — Z1231 Encounter for screening mammogram for malignant neoplasm of breast: Secondary | ICD-10-CM

## 2013-02-16 ENCOUNTER — Ambulatory Visit (HOSPITAL_COMMUNITY)
Admission: RE | Admit: 2013-02-16 | Discharge: 2013-02-16 | Disposition: A | Discharge status: Home / Self Care | Payer: 59 | Source: Ambulatory Visit | Attending: Obstetrics and Gynecology | Admitting: Obstetrics and Gynecology

## 2013-02-16 DIAGNOSIS — Z1231 Encounter for screening mammogram for malignant neoplasm of breast: Secondary | ICD-10-CM | POA: Insufficient documentation

## 2013-04-16 ENCOUNTER — Ambulatory Visit (INDEPENDENT_AMBULATORY_CARE_PROVIDER_SITE_OTHER): Payer: 59 | Admitting: Nurse Practitioner

## 2013-04-16 VITALS — BP 120/78 | HR 86 | Temp 97.8°F | Resp 18 | Ht 66.5 in | Wt 164.0 lb

## 2013-04-16 DIAGNOSIS — M79609 Pain in unspecified limb: Secondary | ICD-10-CM

## 2013-04-16 DIAGNOSIS — M898X1 Other specified disorders of bone, shoulder: Secondary | ICD-10-CM

## 2013-04-16 NOTE — Progress Notes (Signed)
   Subjective:    Patient ID: Jennifer Sharp, female    DOB: [birth year], 53 y.o.   MRN: [id number]  Shoulder Pain  Pain location: Left clavicle. This is a new problem. The current episode started 1 to 4 weeks ago (1w). There has been no history of extremity trauma (pt started exercise program 2 mos ago-light hand weights). The problem occurs intermittently. The problem has been unchanged. The quality of the pain is described as aching. The pain is mild. Associated symptoms include joint swelling (mild supraclavicular swelling between L shoulder & neck, noticed 1 mo. ago) and a limited range of motion. Pertinent negatives include no fever, inability to bear weight or joint locking. The symptoms are aggravated by activity (overhead extension, posterior extension). She has tried rest (stopped lifting weights-just doing ROM) for the symptoms. The treatment provided no relief. There is no history of diabetes, gout, osteoarthritis or rheumatoid arthritis.      Review of Systems  Constitutional: Negative for fever, chills, appetite change and fatigue.  Respiratory: Negative for cough and shortness of breath.   Musculoskeletal: Positive for neck pain (Left clavicular pain). Negative for arthralgias, back pain, gout, joint swelling, myalgias and neck stiffness.  Neurological: Negative for light-headedness and headaches.  Hematological: Negative for adenopathy.       Objective:   Physical Exam  Vitals reviewed. Constitutional: She is oriented to person, place, and time. She appears well-developed and well-nourished. No distress.  HENT:  Head: Normocephalic and atraumatic.  Eyes: Conjunctivae are normal. Right eye exhibits no discharge. Left eye exhibits no discharge.  Neck: Normal range of motion. Neck supple. No thyromegaly present.  Cardiovascular: Normal rate.   Pulmonary/Chest: Effort normal. No respiratory distress.  Musculoskeletal: She exhibits edema (mild supraclav swelling between  shoulder & neck-looks l& feels like fat pad/lipoma.) and tenderness (Tender to palpation along L clavicle. ).  Noncommunicating R clavicle-pt states congentital. Mild thoracic scoliosis-R scapula & shoulder slightly higher than Left. Decreased ROM overhead extension, Painful ROM w/anterior extension across body. Decreased strength L arm w/AROM w/posterior push off.  Lymphadenopathy:    She has no cervical adenopathy (palpable tonsillar nodes, NT, moveable, about sz of small marble).  Neurological: She is alert and oriented to person, place, and time.  Skin: Skin is warm and dry.  Psychiatric: She has a normal mood and affect. Her behavior is normal. Thought content normal.          Assessment & Plan:  1. Pain of left clavicle X 1 wk Mild supraclavicular swelling b/w shoulder & neck X 11mo. Discussed imaging including plain film of clavicle, shoulder, CT soft tissue neck. Pt deciding to take conservative approach. - Ambulatory referral to Sports Medicine

## 2013-04-16 NOTE — Progress Notes (Signed)
Pre visit review using our clinic review tool, if applicable. No additional management support is needed unless otherwise documented below in the visit note. 

## 2013-04-16 NOTE — Patient Instructions (Signed)
Continue ROM exercises in L arm & shoulder. Do not force any movement that is painful. Hold positions for 5-10 seconds, repeat 5 times. Please see Dr Oneida Alar for further evaluation.   Shoulder Exercises EXERCISES  RANGE OF MOTION (ROM) AND STRETCHING EXERCISES These exercises may help you when beginning to rehabilitate your injury. Your symptoms may resolve with or without further involvement from your physician, physical therapist or athletic trainer. While completing these exercises, remember:   Restoring tissue flexibility helps normal motion to return to the joints. This allows healthier, less painful movement and activity.  An effective stretch should be held for at least 30 seconds.  A stretch should never be painful. You should only feel a gentle lengthening or release in the stretched tissue. ROM - Pendulum  Bend at the waist so that your right / left arm falls away from your body. Support yourself with your opposite hand on a solid surface, such as a table or a countertop.  Your right / left arm should be perpendicular to the ground. If it is not perpendicular, you need to lean over farther. Relax the muscles in your right / left arm and shoulder as much as possible.  Gently sway your hips and trunk so they move your right / left arm without any use of your right / left shoulder muscles.  Progress your movements so that your right / left arm moves side to side, then forward and backward, and finally, both clockwise and counterclockwise.  Complete __________ repetitions in each direction. Many people use this exercise to relieve discomfort in their shoulder as well as to gain range of motion. Repeat __________ times. Complete this exercise __________ times per day. STRETCH  Flexion, Standing  Stand with good posture. With an underhand grip on your right / left hand and an overhand grip on the opposite hand, grasp a broomstick or cane so that your hands are a little more than  shoulder-width apart.  Keeping your right / left elbow straight and shoulder muscles relaxed, push the stick with your opposite hand to raise your right / left arm in front of your body and then overhead. Raise your arm until you feel a stretch in your right / left shoulder, but before you have increased shoulder pain.  Try to avoid shrugging your right / left shoulder as your arm rises by keeping your shoulder blade tucked down and toward your mid-back spine. Hold __________ seconds.  Slowly return to the starting position. Repeat __________ times. Complete this exercise __________ times per day. STRETCH - Internal Rotation  Place your right / left hand behind your back, palm-up.  Throw a towel or belt over your opposite shoulder. Grasp the towel/belt with your right / left hand.  While keeping an upright posture, gently pull up on the towel/belt until you feel a stretch in the front of your right / left shoulder.  Avoid shrugging your right / left shoulder as your arm rises by keeping your shoulder blade tucked down and toward your mid-back spine.  Hold __________. Release the stretch by lowering your opposite hand. Repeat __________ times. Complete this exercise __________ times per day. STRETCH - External Rotation and Abduction  Stagger your stance through a doorframe. It does not matter which foot is forward.  As instructed by your physician, physical therapist or athletic trainer, place your hands:  And forearms above your head and on the door frame.  And forearms at head-height and on the door frame.  At elbow-height  and on the door frame.  Keeping your head and chest upright and your stomach muscles tight to prevent over-extending your low-back, slowly shift your weight onto your front foot until you feel a stretch across your chest and/or in the front of your shoulders.  Hold __________ seconds. Shift your weight to your back foot to release the stretch. Repeat __________  times. Complete this stretch __________ times per day.  STRENGTHENING EXERCISES  These exercises may help you when beginning to rehabilitate your injury. They may resolve your symptoms with or without further involvement from your physician, physical therapist or athletic trainer. While completing these exercises, remember:   Muscles can gain both the endurance and the strength needed for everyday activities through controlled exercises.  Complete these exercises as instructed by your physician, physical therapist or athletic trainer. Progress the resistance and repetitions only as guided.  You may experience muscle soreness or fatigue, but the pain or discomfort you are trying to eliminate should never worsen during these exercises. If this pain does worsen, stop and make certain you are following the directions exactly. If the pain is still present after adjustments, discontinue the exercise until you can discuss the trouble with your clinician.  If advised by your physician, during your recovery, avoid activity or exercises which involve actions that place your right / left hand or elbow above your head or behind your back or head. These positions stress the tissues which are trying to heal. STRENGTH - Scapular Depression and Adduction  With good posture, sit on a firm chair. Supported your arms in front of you with pillows, arm rests or a table top. Have your elbows in line with the sides of your body.  Gently draw your shoulder blades down and toward your mid-back spine. Gradually increase the tension without tensing the muscles along the top of your shoulders and the back of your neck.  Hold for __________ seconds. Slowly release the tension and relax your muscles completely before completing the next repetition.  After you have practiced this exercise, remove the arm support and complete it in standing as well as sitting. Repeat __________ times. Complete this exercise __________ times per  day.  STRENGTH - External Rotators  Secure a rubber exercise band/tubing to a fixed object so that it is at the same height as your right / left elbow when you are standing or sitting on a firm surface.  Stand or sit so that the secured exercise band/tubing is at your side that is not injured.  Bend your elbow 90 degrees. Place a folded towel or small pillow under your right / left arm so that your elbow is a few inches away from your side.  Keeping the tension on the exercise band/tubing, pull it away from your body, as if pivoting on your elbow. Be sure to keep your body steady so that the movement is only coming from your shoulder rotating.  Hold __________ seconds. Release the tension in a controlled manner as you return to the starting position. Repeat __________ times. Complete this exercise __________ times per day.  STRENGTH - Supraspinatus  Stand or sit with good posture. Grasp a __________ weight or an exercise band/tubing so that your hand is "thumbs-up," like when you shake hands.  Slowly lift your right / left hand from your thigh into the air, traveling about 30 degrees from straight out at your side. Lift your hand to shoulder height or as far as you can without increasing any shoulder pain.  Initially, many people do not lift their hands above shoulder height.  Avoid shrugging your right / left shoulder as your arm rises by keeping your shoulder blade tucked down and toward your mid-back spine.  Hold for __________ seconds. Control the descent of your hand as you slowly return to your starting position. Repeat __________ times. Complete this exercise __________ times per day.  STRENGTH - Shoulder Extensors  Secure a rubber exercise band/tubing so that it is at the height of your shoulders when you are either standing or sitting on a firm arm-less chair.  With a thumbs-up grip, grasp an end of the band/tubing in each hand. Straighten your elbows and lift your hands straight  in front of you at shoulder height. Step back away from the secured end of band/tubing until it becomes tense.  Squeezing your shoulder blades together, pull your hands down to the sides of your thighs. Do not allow your hands to go behind you.  Hold for __________ seconds. Slowly ease the tension on the band/tubing as you reverse the directions and return to the starting position. Repeat __________ times. Complete this exercise __________ times per day.  STRENGTH - Scapular Retractors  Secure a rubber exercise band/tubing so that it is at the height of your shoulders when you are either standing or sitting on a firm arm-less chair.  With a palm-down grip, grasp an end of the band/tubing in each hand. Straighten your elbows and lift your hands straight in front of you at shoulder height. Step back away from the secured end of band/tubing until it becomes tense.  Squeezing your shoulder blades together, draw your elbows back as you bend them. Keep your upper arm lifted away from your body throughout the exercise.  Hold __________ seconds. Slowly ease the tension on the band/tubing as you reverse the directions and return to the starting position. Repeat __________ times. Complete this exercise __________ times per day. STRENGTH  Scapular Depressors  Find a sturdy chair without wheels, such as a from a dining room table.  Keeping your feet on the floor, lift your bottom from the seat and lock your elbows.  Keeping your elbows straight, allow gravity to pull your body weight down. Your shoulders will rise toward your ears.  Raise your body against gravity by drawing your shoulder blades down your back, shortening the distance between your shoulders and ears. Although your feet should always maintain contact with the floor, your feet should progressively support less body weight as you get stronger.  Hold __________ seconds. In a controlled and slow manner, lower your body weight to begin the  next repetition. Repeat __________ times. Complete this exercise __________ times per day.  Document Released: 12/19/2004 Document Revised: 04/29/2011 Document Reviewed: 05/19/2008 [facility] Patient Information 2014 [place], [place].

## 2013-04-26 ENCOUNTER — Ambulatory Visit: Payer: 59 | Admitting: Sports Medicine

## 2013-05-05 ENCOUNTER — Ambulatory Visit (HOSPITAL_COMMUNITY)
Admission: RE | Admit: 2013-05-05 | Discharge: 2013-05-05 | Disposition: A | Discharge status: Home / Self Care | Payer: 59 | Source: Ambulatory Visit | Attending: Sports Medicine | Admitting: Sports Medicine

## 2013-05-05 ENCOUNTER — Encounter (HOSPITAL_COMMUNITY): Payer: Self-pay

## 2013-05-05 ENCOUNTER — Encounter: Payer: Self-pay | Admitting: Sports Medicine

## 2013-05-05 ENCOUNTER — Ambulatory Visit (INDEPENDENT_AMBULATORY_CARE_PROVIDER_SITE_OTHER): Payer: 59 | Admitting: Sports Medicine

## 2013-05-05 VITALS — BP 114/77 | Ht 66.0 in | Wt 163.0 lb

## 2013-05-05 DIAGNOSIS — Q688 Other specified congenital musculoskeletal deformities: Secondary | ICD-10-CM | POA: Insufficient documentation

## 2013-05-05 DIAGNOSIS — M898X1 Other specified disorders of bone, shoulder: Secondary | ICD-10-CM

## 2013-05-05 DIAGNOSIS — M79609 Pain in unspecified limb: Secondary | ICD-10-CM

## 2013-05-05 DIAGNOSIS — J984 Other disorders of lung: Secondary | ICD-10-CM | POA: Insufficient documentation

## 2013-05-05 DIAGNOSIS — M25419 Effusion, unspecified shoulder: Secondary | ICD-10-CM | POA: Insufficient documentation

## 2013-05-05 DIAGNOSIS — R222 Localized swelling, mass and lump, trunk: Secondary | ICD-10-CM

## 2013-05-05 DIAGNOSIS — M25519 Pain in unspecified shoulder: Secondary | ICD-10-CM | POA: Insufficient documentation

## 2013-05-05 MED ORDER — IOHEXOL 300 MG/ML  SOLN
100.0000 mL | Freq: Once | INTRAMUSCULAR | Status: AC | PRN
  Administered 2013-05-05: 100 mL via INTRAVENOUS

## 2013-05-05 NOTE — Progress Notes (Addendum)
   Subjective:    Patient ID: Jennifer Sharp, female    DOB: 07/11/1960, 53 y.o.   MRN: [id number]  HPI Ms. Coyle is right hand dominant and presents complaining of left supraclavicular swelling for 2 months and pain along the clavicle for 6 weeks. She first noticed the swelling after working out about 2 months ago. Denies injury. She uses 3 lb weights when walking on the treadmill but other than no significant upper-body weight training. Sometimes it appears more swollen than other times but it never completely goes away. Pain is dull and constant and rated 2/10 at rest. It is worse with range of motion, carrying things and lying on the left side. Improved with Motrin but she has only taken it at night to help her get comfortable. Denies nighttime waking in pain, tingling or numbness radiating down the arm. Pain is located along the clavicle, below the clavicle and on the top of the shoulder. No neck or arm pain. No deep-seated shoulder pain. Her PCP gave her shoulder strengthening exercises which she has been doing but has not noticed any difference. She has stopped using weights when walking.   She has a history of pre-melanoma removed from the right arm. Father with basel cell cancer but other than that does not endorse family or personal history of cancer.  She also has a history of Right shoulder pain. 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 But this does not limit right shoulder movement and has never caused her any trouble.    Review of Systems Negative apart from HPI     Objective:   Physical Exam Well-developed, well-nourished. No acute distress. Awake alert and oriented x3. Vital signs are reviewed.  Left shoulder Inspection: Supraclavicular swelling near base of neck on the left side. No erythema. No facial swelling. No swelling of the left upper extremity. Palpation: Some tenderness to palpation along the left clavicle diffusely. No obvious  lymphadenopathy.  ROM: Loss of 5-10 degrees of active flexion on the left, full passive flexion. Normal abduction, internal and external ROM. Normal deltoid, biceps, and triceps strength. Special: negative empty can, negative Hawkins, negative O'Brien Neurovascularly intact distally  Right shoulder Inspection: Normal. No supraclavicular swelling Palpation: There is a palpable bony irregularity along the mid clavicle. It is nontender. Range of motion: Normal Strength: Normal Neurovascularly intact distally     Assessment & Plan:  53 yo with supraclavicular swelling on the Left shoulder  --CXR and Left Clavicle X-Ray --CT with contrast of neck and chest--Scheduled for Friday March 20th at 11 am --Aleve BID to see if it improves pain and swelling --Will call to discuss imaging results--pt cell phone [phone]. Further workup and treatment pending those results.  Seen with Jacqulyn Liner, MS4

## 2013-05-05 NOTE — Patient Instructions (Signed)
CT OF NECK AND CHEST AT [place] 05/07/13 AT 11A ARRIVAL TIME IS 1045A CXR CAN BE DONE BEFORE THAT WITHOUT APPT NEEDED [phone]

## 2013-05-06 MED ORDER — DICLOFENAC SODIUM 75 MG PO TBEC: 75.0000 mg | DELAYED_RELEASE_TABLET | Freq: Two times a day (BID) | ORAL | Status: DC

## 2013-05-06 NOTE — Addendum Note (Signed)
Addended by: Arnette Schaumann C on: 05/06/2013 09:51 AM   Modules accepted: Orders

## 2013-05-07 ENCOUNTER — Ambulatory Visit (HOSPITAL_COMMUNITY): Payer: 59

## 2013-05-07 ENCOUNTER — Telehealth: Payer: Self-pay | Admitting: Sports Medicine

## 2013-05-07 NOTE — Telephone Encounter (Signed)
I spoke with the patient on the phone yesterday regarding the results of the CT scan of her chest and neck. CT scan of the chest is unremarkable. CT scan of the neck shows inflammatory changes about the left sternoclavicular joint likely reflecting underlying arthritis. 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 Based on these findings I would like to try a trial of Voltaren 75 mg twice daily for one week and have the patient followup with me in one to 2 weeks. If symptoms persist we could consider a cortisone injection into the sternoclavicular joint. If symptoms persist thereafter, then the possibility of an underlying inflammatory arthropathy may need to be considered.

## 2013-05-17 ENCOUNTER — Encounter: Payer: Self-pay | Admitting: Sports Medicine

## 2013-05-17 ENCOUNTER — Ambulatory Visit (INDEPENDENT_AMBULATORY_CARE_PROVIDER_SITE_OTHER): Payer: 59 | Admitting: Sports Medicine

## 2013-05-17 VITALS — BP 140/88 | Ht 66.0 in | Wt 160.0 lb

## 2013-05-17 DIAGNOSIS — M19019 Primary osteoarthritis, unspecified shoulder: Secondary | ICD-10-CM

## 2013-05-17 HISTORY — DX: Primary osteoarthritis, unspecified shoulder: M19.019

## 2013-05-17 NOTE — Progress Notes (Signed)
   Subjective:    Patient ID: Jennifer Sharp, female    DOB: 08-25-1960, 53 y.o.   MRN: [id number]  HPI Patient comes in today for followup. CT scan of her neck showed degenerative changes at the left a.c. joint. Her swelling has improved on diclofenac. Minimal pain. CT scan of her chest was unremarkable.    Review of Systems     Objective:   Physical Exam Well-developed, well-nourished. No acute distress. Syncopal in the exam room  Left [place] joint shows no significant enlargement. There is still some mild soft tissue swelling in the supraclavicular region on the left but it is much improved compared to last week's visit. Neurovascularly intact distally.       Assessment & Plan:  Left [place] joint osteoarthritis  At this point, the patient's pain and swelling are minimal. She will start to wean off of her Voltaren over the next week or so. If pain returns and symptoms warrant we could consider a cortisone injection into the [place] joint. Patient is cautioned about resistance training (weight training) and she understands that this may increase her discomfort. At this point in time I do not believe we need to pursue any sort of rheumatological workup but the patient is instructed to let me know if she notices swelling in any other joints. Otherwise, she will followup with me when necessary.

## 2013-06-03 ENCOUNTER — Other Ambulatory Visit: Payer: 59

## 2013-06-04 ENCOUNTER — Other Ambulatory Visit (INDEPENDENT_AMBULATORY_CARE_PROVIDER_SITE_OTHER): Payer: 59

## 2013-06-04 DIAGNOSIS — Z0000 Encounter for general adult medical examination without abnormal findings: Secondary | ICD-10-CM

## 2013-06-04 LAB — CBC WITH DIFFERENTIAL/PLATELET
BASOS ABS: 0.1 10*3/uL (ref 0.0–0.1)
Basophils Relative: 0.9 % (ref 0.0–3.0)
EOS ABS: 0.3 10*3/uL (ref 0.0–0.7)
Eosinophils Relative: 3.2 % (ref 0.0–5.0)
HCT: 43.7 % (ref 36.0–46.0)
HEMOGLOBIN: 14.7 g/dL (ref 12.0–15.0)
LYMPHS PCT: 34.2 % (ref 12.0–46.0)
Lymphs Abs: 2.7 10*3/uL (ref 0.7–4.0)
MCHC: 33.7 g/dL (ref 30.0–36.0)
MCV: 94.4 fl (ref 78.0–100.0)
Monocytes Absolute: 0.5 10*3/uL (ref 0.1–1.0)
Monocytes Relative: 6.3 % (ref 3.0–12.0)
NEUTROS ABS: 4.4 10*3/uL (ref 1.4–7.7)
Neutrophils Relative %: 55.4 % (ref 43.0–77.0)
Platelets: 269 10*3/uL (ref 150.0–400.0)
RBC: 4.63 Mil/uL (ref 3.87–5.11)
RDW: 13.3 % (ref 11.5–14.6)
WBC: 8 10*3/uL (ref 4.5–10.5)

## 2013-06-04 LAB — COMPREHENSIVE METABOLIC PANEL
ALT: 15 U/L (ref 0–35)
AST: 14 U/L (ref 0–37)
Albumin: 3.9 g/dL (ref 3.5–5.2)
Alkaline Phosphatase: 33 U/L — ABNORMAL LOW (ref 39–117)
BILIRUBIN TOTAL: 0.9 mg/dL (ref 0.3–1.2)
BUN: 15 mg/dL (ref 6–23)
CALCIUM: 9.2 mg/dL (ref 8.4–10.5)
CO2: 28 meq/L (ref 19–32)
Chloride: 102 mEq/L (ref 96–112)
Creatinine, Ser: 0.9 mg/dL (ref 0.4–1.2)
GFR: 71.73 mL/min (ref >60.00)
GLUCOSE: 73 mg/dL (ref 70–99)
Potassium: 4 mEq/L (ref 3.5–5.1)
Sodium: 138 mEq/L (ref 135–145)
TOTAL PROTEIN: 6.8 g/dL (ref 6.0–8.3)

## 2013-06-04 LAB — LIPID PANEL
Cholesterol: 144 mg/dL (ref 0–200)
HDL: 45.3 mg/dL (ref >39.00)
LDL Cholesterol: 79 mg/dL (ref 0–99)
Total CHOL/HDL Ratio: 3
Triglycerides: 98 mg/dL (ref 0.0–149.0)
VLDL: 19.6 mg/dL (ref 0.0–40.0)

## 2013-06-04 LAB — TSH: TSH: 0.64 u[IU]/mL (ref 0.35–5.50)

## 2013-06-14 ENCOUNTER — Other Ambulatory Visit: Payer: 59

## 2013-06-21 ENCOUNTER — Ambulatory Visit (INDEPENDENT_AMBULATORY_CARE_PROVIDER_SITE_OTHER): Payer: 59 | Admitting: Family Medicine

## 2013-06-21 ENCOUNTER — Encounter: Payer: Self-pay | Admitting: Family Medicine

## 2013-06-21 VITALS — BP 116/72 | HR 79 | Temp 98.2°F | Ht 66.0 in | Wt 158.1 lb

## 2013-06-21 DIAGNOSIS — E663 Overweight: Secondary | ICD-10-CM

## 2013-06-21 DIAGNOSIS — D126 Benign neoplasm of colon, unspecified: Secondary | ICD-10-CM

## 2013-06-21 DIAGNOSIS — E785 Hyperlipidemia, unspecified: Secondary | ICD-10-CM

## 2013-06-21 DIAGNOSIS — Z8742 Personal history of other diseases of the female genital tract: Secondary | ICD-10-CM

## 2013-06-21 DIAGNOSIS — K219 Gastro-esophageal reflux disease without esophagitis: Secondary | ICD-10-CM

## 2013-06-21 DIAGNOSIS — R609 Edema, unspecified: Secondary | ICD-10-CM

## 2013-06-21 DIAGNOSIS — C439 Malignant melanoma of skin, unspecified: Secondary | ICD-10-CM

## 2013-06-21 DIAGNOSIS — G4733 Obstructive sleep apnea (adult) (pediatric): Secondary | ICD-10-CM

## 2013-06-21 DIAGNOSIS — K5900 Constipation, unspecified: Secondary | ICD-10-CM

## 2013-06-21 DIAGNOSIS — M19019 Primary osteoarthritis, unspecified shoulder: Secondary | ICD-10-CM

## 2013-06-21 DIAGNOSIS — Z0000 Encounter for general adult medical examination without abnormal findings: Secondary | ICD-10-CM

## 2013-06-21 DIAGNOSIS — K635 Polyp of colon: Secondary | ICD-10-CM

## 2013-06-21 DIAGNOSIS — Z9989 Dependence on other enabling machines and devices: Secondary | ICD-10-CM

## 2013-06-21 MED ORDER — ESOMEPRAZOLE MAGNESIUM 40 MG PO CPDR: 40.0000 mg | DELAYED_RELEASE_CAPSULE | Freq: Every day | ORAL | Status: DC

## 2013-06-21 MED ORDER — RANITIDINE HCL 300 MG PO TABS
300.0000 mg | ORAL_TABLET | Freq: Every day | ORAL | Status: DC
Start: 2013-06-21 — End: 2014-08-10

## 2013-06-21 NOTE — Patient Instructions (Addendum)
Encouraged increased hydration and fiber in diet. Daily probiotics. If bowels not moving can use MOM 2 tbls po in 4 oz of warm prune juice by mouth every 2-3 days. If no results then repeat in 4 hours with  Dulcolax suppository pr, may repeat again in 4 more hours as needed. Seek care if symptoms worsen. Consider daily Miralax and/or Dulcolax if symptoms persist.   Switch around the probiotics and/or fiber.  Gastroesophageal Reflux Disease, Adult Gastroesophageal reflux disease (GERD) happens when acid from your stomach flows up into the esophagus. When acid comes in contact with the esophagus, the acid causes soreness (inflammation) in the esophagus. Over time, GERD may create small holes (ulcers) in the lining of the esophagus. CAUSES   Increased body weight. This puts pressure on the stomach, making acid rise from the stomach into the esophagus.  Smoking. This increases acid production in the stomach.  Drinking alcohol. This causes decreased pressure in the lower esophageal sphincter (valve or ring of muscle between the esophagus and stomach), allowing acid from the stomach into the esophagus.  Late evening meals and a full stomach. This increases pressure and acid production in the stomach.  A malformed lower esophageal sphincter. Sometimes, no cause is found. SYMPTOMS   Burning pain in the lower part of the mid-chest behind the breastbone and in the mid-stomach area. This may occur twice a week or more often.  Trouble swallowing.  Sore throat.  Dry cough.  Asthma-like symptoms including chest tightness, shortness of breath, or wheezing. DIAGNOSIS  Your caregiver may be able to diagnose GERD based on your symptoms. In some cases, X-rays and other tests may be done to check for complications or to check the condition of your stomach and esophagus. TREATMENT  Your caregiver may recommend over-the-counter or prescription medicines to help decrease acid production. Ask your caregiver  before starting or adding any new medicines.  HOME CARE INSTRUCTIONS   Change the factors that you can control. Ask your caregiver for guidance concerning weight loss, quitting smoking, and alcohol consumption.  Avoid foods and drinks that make your symptoms worse, such as:  Caffeine or alcoholic drinks.  Chocolate.  Peppermint or mint flavorings.  Garlic and onions.  Spicy foods.  Citrus fruits, such as oranges, lemons, or limes.  Tomato-based foods such as sauce, chili, salsa, and pizza.  Fried and fatty foods.  Avoid lying down for the 3 hours prior to your bedtime or prior to taking a nap.  Eat small, frequent meals instead of large meals.  Wear loose-fitting clothing. Do not wear anything tight around your waist that causes pressure on your stomach.  Raise the head of your bed 6 to 8 inches with wood blocks to help you sleep. Extra pillows will not help.  Only take over-the-counter or prescription medicines for pain, discomfort, or fever as directed by your caregiver.  Do not take aspirin, ibuprofen, or other nonsteroidal anti-inflammatory drugs (NSAIDs). SEEK IMMEDIATE MEDICAL CARE IF:   You have pain in your arms, neck, jaw, teeth, or back.  Your pain increases or changes in intensity or duration.  You develop nausea, vomiting, or sweating (diaphoresis).  You develop shortness of breath, or you faint.  Your vomit is green, yellow, black, or looks like coffee grounds or blood.  Your stool is red, bloody, or black. These symptoms could be signs of other problems, such as heart disease, gastric bleeding, or esophageal bleeding. MAKE SURE YOU:   Understand these instructions.  Will watch your condition.  Will get help right away if you are not doing well or get worse. Document Released: 11/14/2004 Document Revised: 04/29/2011 Document Reviewed: 08/24/2010 [facility] Patient Information 2014 [place], [place].

## 2013-06-21 NOTE — Progress Notes (Signed)
Pre visit review using our clinic review tool, if applicable. No additional management support is needed unless otherwise documented below in the visit note. 

## 2013-06-21 NOTE — Assessment & Plan Note (Signed)
Asymptomatic, with positive FH of colon cancer is on a 5 year recall cycle

## 2013-06-21 NOTE — Assessment & Plan Note (Signed)
On Left. Improved after treatment by Dr Micheline Chapman of sports med. Encouraged moist heat and gentle stretching as tolerated. May try NSAIDs and prescription meds as directed and report if symptoms worsen or seek immediate care

## 2013-06-21 NOTE — Assessment & Plan Note (Signed)
Follows annually with [facility] no recurrence

## 2013-06-21 NOTE — Assessment & Plan Note (Signed)
Improved with weight loss 

## 2013-06-21 NOTE — Assessment & Plan Note (Signed)
Encouraged heart healthy diet, ongoing exercise, avoid trans fats. Great response to exercise and dietary changes

## 2013-06-21 NOTE — Assessment & Plan Note (Signed)
Good weight loss. Is hoping to loose about 8 more pounds. Continue current diet and exercise

## 2013-06-21 NOTE — Assessment & Plan Note (Signed)
Patient encouraged to maintain heart healthy diet, regular exercise, adequate sleep. Consider daily probiotics. Take medications as prescribed 

## 2013-06-21 NOTE — Progress Notes (Signed)
Patient ID: Jennifer Sharp, female   DOB: 1961-01-22, 53 y.o.   MRN: [id number] [facility] [id number] 05-22-1960 06/21/2013      Progress Note-Follow Up  Subjective  Chief Complaint  No chief complaint on file.   HPI  Patient is a 53 year old female in today for routine medical care. She is doing well. Has been exercising regularly and eating better. Has had good weight loss and as a result feels better. Less edema. Using CPAP regularly. Had gastroenteritis recently but is better. Still needs PPI and H2 blocker frequently. Felt Protonix gave her palpitations so she is only using Nexium and Zantac now. Denies CP/palp/SOB/HA/congestion/fevers/ GU c/o. Taking meds as prescribed  Past Medical History  Diagnosis Date  . Allergy     seasonal- occasionally  . Melanoma 2006    on right arm  . Melanoma   . Hyperlipidemia   . Premenstrual migraine   . Edema   . Fatigue 07/25/2010  . Overweight 07/25/2010  . Reflux 07/25/2010  . Colon polyps 07/25/2010  . History of recurrent UTIs 07/25/2010  . Schatzki's ring 07/25/2010  . Umbilical hernia 02/24/6158  . Preventative health care 06/09/2011  . Candidiasis of skin 06/10/2011  . Tachycardia     H/O transient  tach. as a chlid   . Unspecified constipation 07/19/2012  . PONV (postoperative nausea and vomiting)   . OSA on CPAP 10/02/2011  . Ankle swelling     occasional   . GERD (gastroesophageal reflux disease)     Past Surgical History  Procedure Laterality Date  . Oral surgeries  70's and 80's  . Dilation and curettage of uterus  [phone]  . Melanoma spot on right arm removed  2006  . Tonsillectomy      childhood  . Hernia repair      left inguinal  . Exp pap      this entry made in erro r  . Exploratory laparotomy    . Ventral hernia repair N/A 10/29/2012    Procedure: LAPAROSCOPIC VENTRAL HERNIA WITH MESH;  Surgeon: Shann Medal, MD;  Location: [facility];  Service: General;  Laterality: N/A;    Family History  Problem Relation Age  of Onset  . Hyperlipidemia Mother   . Hypertension Mother   . Cancer Father 9    ish, colon- remission/ skin cancer  . Other Brother     stent placed  . Hypertension Brother   . Hyperlipidemia Brother   . Heart disease Brother     2 cardiac stents  . Other Daughter     heart ablation/ heart arrythmia  . ADD / ADHD Daughter     ADD  . Heart disease Daughter 72    tachyarrythmia, scheduled for ablation   . ADD / ADHD Son     ADD  . Cancer Paternal Uncle     colon  . Diabetes Maternal Grandmother   . Stroke Maternal Grandmother   . Hypertension Maternal Grandmother   . Alzheimer's disease Maternal Grandmother   . Diabetes Maternal Grandfather   . Other Maternal Grandfather     cardiac disease  . Cancer Paternal Grandmother     colon  . Allergy (severe) Brother     smoker  . Other Brother     respiratory problems    History   Social History  . Marital Status: Married    Spouse Name: N/A    Number of Children: N/A  . Years of Education: N/A  Occupational History  . Not on file.   Social History Main Topics  . Smoking status: Never Smoker   . Smokeless tobacco: Never Used  . Alcohol Use: Yes     Comment: socially  . Drug Use: No  . Sexual Activity: Yes    Partners: Male     Comment: no dietary restrictions   Other Topics Concern  . Not on file   Social History Narrative  . No narrative on file    Current Outpatient Prescriptions on File Prior to Visit  Medication Sig Dispense Refill  . calcium carbonate (OS-CAL) 600 MG TABS Take 600 mg by mouth daily.       . diclofenac (VOLTAREN) 75 MG EC tablet Take 1 tablet (75 mg total) by mouth 2 (two) times daily. Take daily for 7 days, then take as needed.  40 tablet  0  . docusate sodium (COLACE) 100 MG capsule Take 100 mg by mouth daily as needed for constipation.      Marland Kitchen ibuprofen (ADVIL,MOTRIN) 200 MG tablet Take 400-600 mg by mouth every 6 (six) hours as needed for pain.      Marland Kitchen levonorgestrel-ethinyl  estradiol (AVIANE,ALESSE,LESSINA) 0.1-20 MG-MCG tablet Take 1 tablet by mouth daily. Take only active pills      . NEXIUM 40 MG capsule TAKE 1 CAPSULE BY MOUTH ONCE DAILY BEFORE BREAKFAST  90 capsule  0  . Omega-3 Fatty Acids (FISH OIL PO) Take 1 capsule by mouth daily. Omega Red      . Probiotic Product (ALIGN PO) Take 1 tablet by mouth daily.       No current facility-administered medications on file prior to visit.    No Known Allergies  Review of Systems  Review of Systems  Constitutional: Negative for fever and malaise/fatigue.  HENT: Negative for congestion.   Eyes: Negative for discharge.  Respiratory: Negative for shortness of breath.   Cardiovascular: Negative for chest pain, palpitations and leg swelling.  Gastrointestinal: Positive for heartburn, abdominal pain and constipation. Negative for nausea, diarrhea, blood in stool and melena.  Genitourinary: Negative for dysuria.  Musculoskeletal: Positive for joint pain. Negative for falls.       Left clavicle  Skin: Negative for rash.  Neurological: Negative for loss of consciousness and headaches.  Endo/Heme/Allergies: Negative for polydipsia.  Psychiatric/Behavioral: Negative for depression and suicidal ideas. The patient is not nervous/anxious and does not have insomnia.     Objective  BP 116/72  Pulse 79  Temp(Src) 98.2 F (36.8 C) (Oral)  Ht 5\' 6"  (1.676 m)  Wt 158 lb 1.3 oz (71.705 kg)  BMI 25.53 kg/m2  SpO2 95%  Physical Exam  Physical Exam  Constitutional: She is oriented to person, place, and time and well-developed, well-nourished, and in no distress. No distress.  HENT:  Head: Normocephalic and atraumatic.  Right Ear: External ear normal.  Left Ear: External ear normal.  Nose: Nose normal.  Mouth/Throat: Oropharynx is clear and moist. No oropharyngeal exudate.  Eyes: Conjunctivae are normal. Pupils are equal, round, and reactive to light. Right eye exhibits no discharge. Left eye exhibits no  discharge. No scleral icterus.  Neck: Normal range of motion. Neck supple. No thyromegaly present.  Cardiovascular: Normal rate, regular rhythm, normal heart sounds and intact distal pulses.   No murmur heard. Pulmonary/Chest: Effort normal and breath sounds normal. No respiratory distress. She has no wheezes. She has no rales.  Abdominal: Soft. Bowel sounds are normal. She exhibits no distension and no mass. There is  no tenderness.  Musculoskeletal: Normal range of motion. She exhibits no edema and no tenderness.  Lymphadenopathy:    She has no cervical adenopathy.  Neurological: She is alert and oriented to person, place, and time. She has normal reflexes. No cranial nerve deficit. Coordination normal.  Skin: Skin is warm and dry. No rash noted. She is not diaphoretic.  Psychiatric: Mood, memory and affect normal.    Lab Results  Component Value Date   TSH 0.64 06/04/2013   Lab Results  Component Value Date   WBC 8.0 06/04/2013   HGB 14.7 06/04/2013   HCT 43.7 06/04/2013   MCV 94.4 06/04/2013   PLT 269.0 06/04/2013   Lab Results  Component Value Date   CREATININE 0.9 06/04/2013   BUN 15 06/04/2013   NA 138 06/04/2013   K 4.0 06/04/2013   CL 102 06/04/2013   CO2 28 06/04/2013   Lab Results  Component Value Date   ALT 15 06/04/2013   AST 14 06/04/2013   ALKPHOS 33* 06/04/2013   BILITOT 0.9 06/04/2013   Lab Results  Component Value Date   CHOL 144 06/04/2013   Lab Results  Component Value Date   HDL 45.30 06/04/2013   Lab Results  Component Value Date   LDLCALC 79 06/04/2013   Lab Results  Component Value Date   TRIG 98.0 06/04/2013   Lab Results  Component Value Date   CHOLHDL 3 06/04/2013     Assessment & Plan  Unspecified constipation Encouraged increased hydration and fiber in diet. Daily probiotics. If bowels not moving can use MOM 2 tbls po in 4 oz of warm prune juice by mouth every 2-3 days. If no results then repeat in 4 hours with  Dulcolax suppository pr, may  repeat again in 4 more hours as needed. Seek care if symptoms worsen. Consider daily Miralax and/or Dulcolax if symptoms persist.   Hyperlipidemia Encouraged heart healthy diet, ongoing exercise, avoid trans fats. Great response to exercise and dietary changes  Melanoma Follows annually with [facility] no recurrence  OSA on CPAP Uses regularly.  Overweight Good weight loss. Is hoping to loose about 8 more pounds. Continue current diet and exercise  Colon polyps Asymptomatic, with positive FH of colon cancer is on a 5 year recall cycle  Edema Improved with weight loss  Preventative health care Patient encouraged to maintain heart healthy diet, regular exercise, adequate sleep. Consider daily probiotics. Take medications as prescribed  Osteoarthritis of sternoclavicular joint On Left. Improved after treatment by Dr Micheline Chapman of sports med. Encouraged moist heat and gentle stretching as tolerated. May try NSAIDs and prescription meds as directed and report if symptoms worsen or seek immediate care

## 2013-06-21 NOTE — Assessment & Plan Note (Signed)
Encouraged increased hydration and fiber in diet. Daily probiotics. If bowels not moving can use MOM 2 tbls po in 4 oz of warm prune juice by mouth every 2-3 days. If no results then repeat in 4 hours with  Dulcolax suppository pr, may repeat again in 4 more hours as needed. Seek care if symptoms worsen. Consider daily Miralax and/or Dulcolax if symptoms persist.  

## 2013-06-21 NOTE — Assessment & Plan Note (Signed)
Uses regularly.

## 2013-12-20 ENCOUNTER — Encounter: Payer: Self-pay | Admitting: Family Medicine

## 2014-06-01 ENCOUNTER — Other Ambulatory Visit: Payer: Self-pay | Admitting: Sports Medicine

## 2014-08-04 ENCOUNTER — Other Ambulatory Visit (HOSPITAL_COMMUNITY): Payer: Self-pay | Admitting: Obstetrics and Gynecology

## 2014-08-04 DIAGNOSIS — Z1231 Encounter for screening mammogram for malignant neoplasm of breast: Secondary | ICD-10-CM

## 2014-08-05 ENCOUNTER — Ambulatory Visit (HOSPITAL_COMMUNITY)
Admission: RE | Admit: 2014-08-05 | Discharge: 2014-08-05 | Disposition: A | Discharge status: Home / Self Care | Payer: 59 | Source: Ambulatory Visit | Attending: Obstetrics and Gynecology | Admitting: Obstetrics and Gynecology

## 2014-08-05 DIAGNOSIS — Z1231 Encounter for screening mammogram for malignant neoplasm of breast: Secondary | ICD-10-CM | POA: Diagnosis not present

## 2014-08-10 ENCOUNTER — Other Ambulatory Visit: Payer: Self-pay | Admitting: Family Medicine

## 2014-08-19 ENCOUNTER — Encounter: Payer: Self-pay | Admitting: Family Medicine

## 2014-08-19 ENCOUNTER — Ambulatory Visit (INDEPENDENT_AMBULATORY_CARE_PROVIDER_SITE_OTHER): Payer: 59 | Admitting: Family Medicine

## 2014-08-19 VITALS — BP 117/82 | HR 88 | Temp 98.7°F | Resp 16 | Ht 66.5 in | Wt 163.0 lb

## 2014-08-19 DIAGNOSIS — T50905A Adverse effect of unspecified drugs, medicaments and biological substances, initial encounter: Secondary | ICD-10-CM

## 2014-08-19 DIAGNOSIS — Z0000 Encounter for general adult medical examination without abnormal findings: Secondary | ICD-10-CM

## 2014-08-19 DIAGNOSIS — T887XXA Unspecified adverse effect of drug or medicament, initial encounter: Secondary | ICD-10-CM

## 2014-08-19 DIAGNOSIS — K5909 Other constipation: Secondary | ICD-10-CM

## 2014-08-19 DIAGNOSIS — K5900 Constipation, unspecified: Secondary | ICD-10-CM

## 2014-08-19 DIAGNOSIS — K219 Gastro-esophageal reflux disease without esophagitis: Secondary | ICD-10-CM

## 2014-08-19 DIAGNOSIS — M2550 Pain in unspecified joint: Secondary | ICD-10-CM | POA: Diagnosis not present

## 2014-08-19 LAB — CBC WITH DIFFERENTIAL/PLATELET
BASOS ABS: 0.1 10*3/uL (ref 0.0–0.1)
Basophils Relative: 0.7 % (ref 0.0–3.0)
EOS PCT: 3.4 % (ref 0.0–5.0)
Eosinophils Absolute: 0.2 10*3/uL (ref 0.0–0.7)
HCT: 46.4 % — ABNORMAL HIGH (ref 36.0–46.0)
Hemoglobin: 15.3 g/dL — ABNORMAL HIGH (ref 12.0–15.0)
LYMPHS PCT: 38.4 % (ref 12.0–46.0)
Lymphs Abs: 2.7 10*3/uL (ref 0.7–4.0)
MCHC: 33 g/dL (ref 30.0–36.0)
MCV: 93.6 fl (ref 78.0–100.0)
Monocytes Absolute: 0.6 10*3/uL (ref 0.1–1.0)
Monocytes Relative: 8.1 % (ref 3.0–12.0)
Neutro Abs: 3.4 10*3/uL (ref 1.4–7.7)
Neutrophils Relative %: 49.4 % (ref 43.0–77.0)
PLATELETS: 271 10*3/uL (ref 150.0–400.0)
RBC: 4.95 Mil/uL (ref 3.87–5.11)
RDW: 13.6 % (ref 11.5–15.5)
WBC: 7 10*3/uL (ref 4.0–10.5)

## 2014-08-19 LAB — COMPREHENSIVE METABOLIC PANEL
ALK PHOS: 58 U/L (ref 39–117)
ALT: 39 U/L — ABNORMAL HIGH (ref 0–35)
AST: 22 U/L (ref 0–37)
Albumin: 4.4 g/dL (ref 3.5–5.2)
BUN: 15 mg/dL (ref 6–23)
CALCIUM: 9.6 mg/dL (ref 8.4–10.5)
CO2: 29 mEq/L (ref 19–32)
Chloride: 102 mEq/L (ref 96–112)
Creatinine, Ser: 0.9 mg/dL (ref 0.40–1.20)
GFR: 69.56 mL/min (ref >60.00)
Glucose, Bld: 98 mg/dL (ref 70–99)
POTASSIUM: 4.1 meq/L (ref 3.5–5.1)
SODIUM: 140 meq/L (ref 135–145)
TOTAL PROTEIN: 6.9 g/dL (ref 6.0–8.3)
Total Bilirubin: 0.8 mg/dL (ref 0.2–1.2)

## 2014-08-19 LAB — LIPID PANEL
Cholesterol: 162 mg/dL (ref 0–200)
HDL: 58.2 mg/dL (ref >39.00)
LDL Cholesterol: 89 mg/dL (ref 0–99)
NONHDL: 103.8
TRIGLYCERIDES: 72 mg/dL (ref 0.0–149.0)
Total CHOL/HDL Ratio: 3
VLDL: 14.4 mg/dL (ref 0.0–40.0)

## 2014-08-19 LAB — TSH: TSH: 1.3 u[IU]/mL (ref 0.35–4.50)

## 2014-08-19 LAB — MAGNESIUM: MAGNESIUM: 2.1 mg/dL (ref 1.5–2.5)

## 2014-08-19 MED ORDER — DICLOFENAC SODIUM 75 MG PO TBEC: DELAYED_RELEASE_TABLET | ORAL | Status: DC

## 2014-08-19 MED ORDER — NEXIUM 40 MG PO CPDR: DELAYED_RELEASE_CAPSULE | ORAL | Status: DC

## 2014-08-19 MED ORDER — RANITIDINE HCL 300 MG PO TABS: 300.0000 mg | ORAL_TABLET | Freq: Every day | ORAL | Status: DC

## 2014-08-19 NOTE — Progress Notes (Signed)
Pre visit review using our clinic review tool, if applicable. No additional management support is needed unless otherwise documented below in the visit note. 

## 2014-08-19 NOTE — Progress Notes (Signed)
Office Note 08/28/2014  CC:  Chief Complaint  Patient presents with  . Annual Exam    HPI:  Jennifer Sharp is a 54 y.o. White female who is here to re-establish care with our office.  She has hx of L sternoclavicular joint inflammation/arthritis/enlargement.  Ankles and feet hurt on and off, worse with/after doing walking.  Feels stiff, no swelling or redness.  Feels diffuse.  Has been problem for 4-5 months, getting a bit worse.  Due to taking diclofenac for this she was checking bp's more often and they have been 120s-130s/70s-80, rare 90s diast.  Has 4 days between BMs, sometimes has an urgent-nondiarrhea--BM when she does walking or other exercise.   Takes benefiber, [place], metamucil.  Sometimes has dysphagia lately, GERD chronic, has hx of dysphagia but has never required esoph dilation. No painful swallowing, no regurgitation or vomiting.  Taking PPI qd + hs H2 blocker. She has questions/reservations about long term effects of PPIs, asks for magnesium level check + vit b12 level check.  Pt has OSA and is doing well on CPAP but asks for rx for new supplies for her CPAP machine.   Past Medical History  Diagnosis Date  . Seasonal allergic rhinitis   . Melanoma 2006    on right arm  . Hyperlipidemia   . Premenstrual migraine   . Edema   . Fatigue 07/25/2010  . Overweight(278.02) 07/25/2010  . GERD (gastroesophageal reflux disease) 07/25/2010    with schatzki's ring on endo 07/2010  . Colon polyps 07/25/2010  . History of recurrent UTIs 07/25/2010  . Umbilical hernia 06/21/6642  . Candidiasis of skin 06/10/2011  . Tachycardia     H/O transient  tach. as a chlid   . Unspecified constipation 07/19/2012  . OSA on CPAP 10/02/2011  . Ankle swelling     occasional   . Family history of colon cancer   . Constipation     Past Surgical History  Procedure Laterality Date  . Oral surgeries  70's and 80's  . Dilation and curettage of uterus  [phone]  . Melanoma excision  2006    R  arm  . Tonsillectomy      childhood  . Hernia repair      left inguinal  . Exploratory laparotomy    . Ventral hernia repair N/A 10/29/2012    Procedure: LAPAROSCOPIC VENTRAL HERNIA WITH MESH;  Surgeon: Shann Medal, MD;  Location: [facility];  Service: General;  Laterality: N/A;  . Colonoscopy  02/2010    5 yr recall (FH colon cancer)    Family History  Problem Relation Age of Onset  . Hyperlipidemia Mother   . Hypertension Mother   . Cancer Father 75    ish, colon- remission/ skin cancer  . Other Brother     stent placed  . Hypertension Brother   . Hyperlipidemia Brother   . Heart disease Brother     2 cardiac stents  . Other Daughter     heart ablation/ heart arrythmia  . ADD / ADHD Daughter     ADD  . Heart disease Daughter 10    tachyarrythmia, scheduled for ablation   . ADD / ADHD Son     ADD  . Cancer Paternal Uncle     colon  . Diabetes Maternal Grandmother   . Stroke Maternal Grandmother   . Hypertension Maternal Grandmother   . Alzheimer's disease Maternal Grandmother   . Diabetes Maternal Grandfather   . Other Maternal Grandfather  cardiac disease  . Cancer Paternal Grandmother     colon  . Allergy (severe) Brother     smoker  . Other Brother     respiratory problems    History   Social History  . Marital Status: Married    Spouse Name: N/A  . Number of Children: N/A  . Years of Education: N/A   Occupational History  . Not on file.   Social History Main Topics  . Smoking status: Never Smoker   . Smokeless tobacco: Never Used  . Alcohol Use: Yes     Comment: socially  . Drug Use: No  . Sexual Activity:    Partners: Male     Comment: no dietary restrictions   Other Topics Concern  . Not on file   Social History Narrative    Outpatient Prescriptions Prior to Visit  Medication Sig Dispense Refill  . calcium carbonate (OS-CAL) 600 MG TABS Take 600 mg by mouth daily.     Marland Kitchen ibuprofen (ADVIL,MOTRIN) 200 MG tablet Take 400-600 mg by  mouth every 6 (six) hours as needed for pain.    . Omega-3 Fatty Acids (FISH OIL PO) Take 1 capsule by mouth daily. Omega Red    . esomeprazole (NEXIUM) 40 MG capsule TAKE 1 CAPSULE BY MOUTH DAILY. 90 capsule 3  . NEXIUM 40 MG capsule TAKE 1 CAPSULE BY MOUTH ONCE DAILY BEFORE BREAKFAST 90 capsule 0  . ranitidine (ZANTAC) 300 MG tablet TAKE 1 TABLET BY MOUTH ONCE DAILY AT BEDTIME 90 tablet 3  . docusate sodium (COLACE) 100 MG capsule Take 100 mg by mouth daily as needed for constipation.    Marland Kitchen levonorgestrel-ethinyl estradiol (AVIANE,ALESSE,LESSINA) 0.1-20 MG-MCG tablet Take 1 tablet by mouth daily. Take only active pills    . Probiotic Product (ALIGN PO) Take 1 tablet by mouth daily.    . diclofenac (VOLTAREN) 75 MG EC tablet TAKE 1 TABLET BY MOUTH TWICE A DAY FOR 7 DAYS, THEN TAKE AS NEEDED (Patient not taking: Reported on 08/19/2014) 40 tablet 0   No facility-administered medications prior to visit.    No Known Allergies  ROS Review of Systems  Constitutional: Negative for fever, chills, appetite change and fatigue.  HENT: Negative for congestion, dental problem, ear pain and sore throat.   Eyes: Negative for discharge, redness and visual disturbance.  Respiratory: Negative for cough, chest tightness, shortness of breath and wheezing.   Cardiovascular: Negative for chest pain, palpitations and leg swelling.  Gastrointestinal: Positive for constipation. Negative for nausea, vomiting, abdominal pain, diarrhea and blood in stool.  Genitourinary: Negative for dysuria, urgency, frequency, hematuria, flank pain and difficulty urinating.  Musculoskeletal: Positive for arthralgias (ankles and feet as per HPI). Negative for myalgias, back pain, joint swelling and neck stiffness.  Skin: Negative for pallor and rash.  Neurological: Negative for dizziness, speech difficulty, weakness and headaches.  Hematological: Negative for adenopathy. Does not bruise/bleed easily.  Psychiatric/Behavioral: Negative  for confusion and sleep disturbance. The patient is not nervous/anxious.     PE; Blood pressure 117/82, pulse 88, temperature 98.7 F (37.1 C), temperature source Temporal, resp. rate 16, height 5' 6.5" (1.689 m), weight 163 lb (73.936 kg), SpO2 97 %. Gen: Alert, well appearing.  Patient is oriented to person, place, time, and situation. AFFECT: pleasant, lucid thought and speech. ENT: Ears: EACs clear, normal epithelium.  TMs with good light reflex and landmarks bilaterally.  Eyes: no injection, icteris, swelling, or exudate.  EOMI, PERRLA. Nose: no drainage or turbinate edema/swelling.  No injection or focal lesion.  Mouth: lips without lesion/swelling.  Oral mucosa pink and moist.  Dentition intact and without obvious caries or gingival swelling.  Oropharynx without erythema, exudate, or swelling.  Neck: supple/nontender.  No LAD, mass, or TM.  Carotid pulses 2+ bilaterally, without bruits. CV: RRR, no m/r/g.   LUNGS: CTA bilat, nonlabored resps, good aeration in all lung fields. ABD: soft, NT, ND, BS normal.  No hepatospenomegaly or mass.  No bruits. EXT: no clubbing, cyanosis, or edema.  Musculoskeletal: no joint swelling, erythema, warmth, or tenderness.  ROM of all joints intact. Skin - no sores or suspicious lesions or rashes or color changes   Pertinent labs:  none  ASSESSMENT AND PLAN:   1) Ankles and feet arthralgias: osteoarthritis vs tendonitis:  Recommended she continue diclofenac 75mg  bid and keep an eye on her bp like she is currently doing.  2) Chronic constipation: mild.  Continue OTC fiber and add senakot S generic 2 tabs qhs.  3) GERD, with some intermittent dysphagia lately, taking nexium daily + H2 blocker evenings. Continue this regimen, and will check mag level.  I've found the vit B12 def in these patients to be more theoretic (and levels perhaps lower than people who are not on PPI daily but NOT clinically low levels), so I reassured her and we did not do this  lab.   She is considering calling Dr. Osborn Coho office for appt to discuss possible redo EGD given chronic PPI requirement + recent dysphagia complaint.  4) OSA on CPAP: stable.  CPAP supplies rx given today.  5) Health maintenance exam: Reviewed age and gender appropriate health maintenance issues (prudent diet, regular exercise, health risks of tobacco and excessive alcohol, use of seatbelts, fire alarms in home, use of sunscreen).  Also reviewed age and gender appropriate health screening as well as vaccine recommendations. FH colon ca: she is due for repeat TCS in 2017 (Dr. Cristina Gong).  She will make appropriate arrangements with his office. HP labs drawn (fasting). Tdap given today.  An After Visit Summary was printed and given to the patient.  FOLLOW UP:  Return in about 1 year (around 08/19/2015) for annual CPE (fasting).

## 2014-08-19 NOTE — Patient Instructions (Signed)
Generic senakot-S OTC: use 2 tabs every night.

## 2014-08-23 ENCOUNTER — Telehealth: Payer: Self-pay | Admitting: Family Medicine

## 2014-08-23 ENCOUNTER — Other Ambulatory Visit: Payer: Self-pay | Admitting: Family Medicine

## 2014-08-23 ENCOUNTER — Other Ambulatory Visit (HOSPITAL_COMMUNITY)
Admission: AD | Admit: 2014-08-23 | Discharge: 2014-08-23 | Disposition: A | Discharge status: Home / Self Care | Payer: 59 | Source: Ambulatory Visit | Attending: Family Medicine | Admitting: Family Medicine

## 2014-08-23 DIAGNOSIS — N3000 Acute cystitis without hematuria: Secondary | ICD-10-CM

## 2014-08-23 DIAGNOSIS — N390 Urinary tract infection, site not specified: Secondary | ICD-10-CM | POA: Insufficient documentation

## 2014-08-23 LAB — URINALYSIS, ROUTINE W REFLEX MICROSCOPIC
Bilirubin Urine: NEGATIVE
Glucose, UA: NEGATIVE mg/dL
Ketones, ur: NEGATIVE mg/dL
NITRITE: POSITIVE — AB
Protein, ur: NEGATIVE mg/dL
Specific Gravity, Urine: 1.005 (ref 1.005–1.030)
Urobilinogen, UA: 0.2 mg/dL (ref 0.0–1.0)
pH: 6 (ref 5.0–8.0)

## 2014-08-23 LAB — URINE MICROSCOPIC-ADD ON

## 2014-08-23 MED ORDER — SULFAMETHOXAZOLE-TRIMETHOPRIM 800-160 MG PO TABS: 1.0000 | ORAL_TABLET | Freq: Two times a day (BID) | ORAL | Status: DC

## 2014-08-23 NOTE — Telephone Encounter (Signed)
Patient called stating she believes she has UTI.  She has urgency, frequency, and burning.  She is working at [employer] today and wanted to see if we could order a U/A and fax requisition order to [phone] attn to [employer].    Please call pt on her cell to let her know if this can be done.

## 2014-08-23 NOTE — Telephone Encounter (Signed)
Pt advised and voiced understanding. Order faxed to number provided below.

## 2014-08-23 NOTE — Telephone Encounter (Signed)
OK, will fax order for UA with culture as well. I'll also look at her chart and then we'll send in abx.-thx

## 2014-08-25 ENCOUNTER — Telehealth: Payer: Self-pay | Admitting: Family Medicine

## 2014-08-25 LAB — URINE CULTURE: Culture: >=100000

## 2014-08-25 MED ORDER — CIPROFLOXACIN HCL 500 MG PO TABS: 500.0000 mg | ORAL_TABLET | Freq: Two times a day (BID) | ORAL | Status: DC

## 2014-08-25 NOTE — Telephone Encounter (Signed)
Pt requesting results of urine culture.  Patient just wants to be sure the 3 day course of bactrim is going to be sufficient treatment.  Her sxs are better but not completely gone.  Please advise.

## 2014-08-25 NOTE — Telephone Encounter (Signed)
Pt aware of results.  New rx sent to pharmacy.

## 2014-08-25 NOTE — Telephone Encounter (Signed)
Lets do another 3d of cipro 500 mg bid since her symptoms are not completely gone. Pls eRx cipro 500mg , 1 tab po bid x 3d, #6, no RF.-thx

## 2014-08-28 ENCOUNTER — Encounter: Payer: Self-pay | Admitting: Family Medicine

## 2014-09-14 ENCOUNTER — Encounter: Payer: Self-pay | Admitting: Physician Assistant

## 2014-09-28 ENCOUNTER — Other Ambulatory Visit (HOSPITAL_COMMUNITY): Payer: Self-pay | Admitting: Physician Assistant

## 2014-09-28 ENCOUNTER — Ambulatory Visit (INDEPENDENT_AMBULATORY_CARE_PROVIDER_SITE_OTHER): Payer: 59 | Admitting: Physician Assistant

## 2014-09-28 ENCOUNTER — Encounter: Payer: Self-pay | Admitting: Physician Assistant

## 2014-09-28 VITALS — BP 120/80 | HR 72 | Ht 66.5 in | Wt 171.0 lb

## 2014-09-28 DIAGNOSIS — R1310 Dysphagia, unspecified: Secondary | ICD-10-CM

## 2014-09-28 DIAGNOSIS — Z86010 Personal history of colonic polyps: Secondary | ICD-10-CM | POA: Diagnosis not present

## 2014-09-28 DIAGNOSIS — R1314 Dysphagia, pharyngoesophageal phase: Secondary | ICD-10-CM

## 2014-09-28 DIAGNOSIS — Z800 Family history of malignant neoplasm of digestive organs: Secondary | ICD-10-CM | POA: Diagnosis not present

## 2014-09-28 DIAGNOSIS — K5909 Other constipation: Secondary | ICD-10-CM | POA: Diagnosis not present

## 2014-09-28 DIAGNOSIS — K219 Gastro-esophageal reflux disease without esophagitis: Secondary | ICD-10-CM | POA: Diagnosis not present

## 2014-09-28 MED ORDER — PEG-KCL-NACL-NASULF-NA ASC-C 100 G PO SOLR: 1.0000 | Freq: Once | ORAL | Status: DC

## 2014-09-28 MED ORDER — LUBIPROSTONE 8 MCG PO CAPS: 8.0000 ug | ORAL_CAPSULE | Freq: Two times a day (BID) | ORAL | Status: DC

## 2014-09-28 NOTE — Patient Instructions (Signed)
We have sent the following medications to your pharmacy for you to pick up at your convenience:Amitiza.  You have been scheduled for an endoscopy and colonoscopy. Please follow the written instructions given to you at your visit today. Please pick up your prep supplies at the pharmacy within the next 1-3 days. If you use inhalers (even only as needed), please bring them with you on the day of your procedure.   You have been scheduled for a modified barium swallow on 10/04/14 at 11:30am. Please arrive 15 minutes prior to your test for registration. You will go to [facility] (1st Floor) for your appointment. Please refrain from eating or drinking anything 4 hours prior to your test. Should you need to cancel or reschedule your appointment, please contact [phone] [facility]) or [phone] [facility] [place]). _____________________________________________________________________ A Modified Barium Swallow Study, or MBS, is a special x-ray that is taken to check swallowing skills. It is carried out by a Stage manager and a Psychologist, clinical (SLP). During this test, yourmouth, throat, and esophagus, a muscular tube which connects your mouth to your stomach, is checked. The test will help you, your doctor, and the SLP plan what types of foods and liquids are easier for you to swallow. The SLP will also identify positions and ways to help you swallow more easily and safely. What will happen during an MBS? You will be taken to an x-ray room and seated comfortably. You will be asked to swallow small amounts of food and liquid mixed with barium. Barium is a liquid or paste that allows images of your mouth, throat and esophagus to be seen on x-ray. The x-ray captures moving images of the food you are swallowing as it travels from your mouth through your throat and into your esophagus. This test helps identify whether food or liquid is entering your lungs (aspiration). The test also shows which  part of your mouth or throat lacks strength or coordination to move the food or liquid in the right direction. This test typically takes 30 minutes to 1 hour to complete. _______________________________________________________________________

## 2014-09-28 NOTE — Progress Notes (Addendum)
Patient ID: Jennifer Sharp, female   DOB: 22-Nov-1960, 54 y.o.   MRN: [id number]    HPI:  Jennifer Sharp is a 54 y.o.   female referred by Mosie Lukes, MD for evaluation of GERD, dysphagia, constipation, and a personal history of colon polyps. She has a history of GERD, colon polyps, Schatzki's ring, obstructive sleep apnea on Cipro, osteoarthritis, hyperlipidemia, and melanoma.  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 She has previously been followed for her GI problems by Dr. Cristina Gong, but states that a recent change of her insurance requires that she come to our office. She states she was recently evaluated by her primary care provider for her annual checkup and was referred to her office. She reports that she last had a colonoscopy in 2011. She states her father had colon cancer at age 51, and due to this family history she had her first colonoscopy at age 77. She states at that first colonoscopy she had polyps removed and she has had 2 colonoscopies since at which time no polyps were found. She reports she is due for surveillance this year. She also states that she has a history of GERD and a Schatzki's ring. She has had EGDs with dilation of her Schatzki's ring in the past. Recently she has been having pill dysphagia and states she has some difficulty swallowing liquids as well. She does not feel as if liquids pool in her throat but she coughs and sputters with fluids. She also complains of a frequent globus sensation. She denies heartburn. She has been using Nexium every morning and ranitidine at bedtime.  She also reports that she has been constipated for most of her adult life. She says she has never had any regularity to her bowel pattern. She typically skips for 5 days between bowel movements. She has tried using activity a, Benefiber, and fiber supplements with no relief. She was advised to use a laxity of on a daily basis and so she has been using Duke's C with Senokot. She  tried 1 tab daily with no relief, so she increased to 1 tablet twice daily. Despite this, she still skipped 3-4 days between bowel movements, so she recently increased to 2 tabs twice daily for the past 3 weeks and is now having a bowel movement every third day however her stools are very formed and she has to strain. She feels very bloated. She tries to eat a lot of fruits and vegetables but states it doesn't help. She has not had any bright red blood per rectum or black tarry stools.   Past Medical History  Diagnosis Date  . Seasonal allergic rhinitis   . Melanoma 2006    on right arm  . Hyperlipidemia   . Premenstrual migraine   . Edema   . Fatigue 07/25/2010  . Overweight(278.02) 07/25/2010  . GERD (gastroesophageal reflux disease) 07/25/2010    with schatzki's ring on endo 07/2010  . Colon polyps 07/25/2010  . History of recurrent UTIs 07/25/2010  . Umbilical hernia 05/24/5033  . Candidiasis of skin 06/10/2011  . Tachycardia     H/O transient  tach. as a chlid   . Unspecified constipation 07/19/2012  . OSA on CPAP 10/02/2011  . Ankle swelling     occasional   . Family history of colon cancer   . Constipation     Past Surgical History  Procedure Laterality Date  . Oral surgeries  70's and 80's  . Dilation and  curettage of uterus  [phone]  . Melanoma excision  2006    R arm  . Tonsillectomy      childhood  . Hernia repair      left inguinal  . Exploratory laparotomy    . Ventral hernia repair N/A 10/29/2012    Procedure: LAPAROSCOPIC VENTRAL HERNIA WITH MESH;  Surgeon: Shann Medal, MD;  Location: [facility];  Service: General;  Laterality: N/A;  . Colonoscopy  02/2010    5 yr recall (FH colon cancer)   Family History  Problem Relation Age of Onset  . Hyperlipidemia Mother   . Hypertension Mother   . Cancer Father 40    ish, colon- remission/ skin cancer  . Other Brother     stent placed  . Hypertension Brother   . Hyperlipidemia Brother   . Heart disease Brother     2  cardiac stents  . Other Daughter     heart ablation/ heart arrythmia  . ADD / ADHD Daughter     ADD  . Heart disease Daughter 62    tachyarrythmia, scheduled for ablation   . ADD / ADHD Son     ADD  . Cancer Paternal Uncle     colon  . Diabetes Maternal Grandmother   . Stroke Maternal Grandmother   . Hypertension Maternal Grandmother   . Alzheimer's disease Maternal Grandmother   . Diabetes Maternal Grandfather   . Other Maternal Grandfather     cardiac disease  . Cancer Paternal Grandmother     colon  . Allergy (severe) Brother     smoker  . Other Brother     respiratory problems   Social History  Substance Use Topics  . Smoking status: Never Smoker   . Smokeless tobacco: Never Used  . Alcohol Use: 0.0 oz/week    0 Standard drinks or equivalent per week     Comment: socially   Current Outpatient Prescriptions  Medication Sig Dispense Refill  . calcium carbonate (OS-CAL) 600 MG TABS Take 600 mg by mouth daily.     . diclofenac (VOLTAREN) 75 MG EC tablet TAKE 1 TABLET BY MOUTH TWICE A DAY FOR 7 DAYS, THEN TAKE AS NEEDED 40 tablet 6  . docusate sodium (COLACE) 100 MG capsule Take 100 mg by mouth 2 (two) times daily.     Marland Kitchen ibuprofen (ADVIL,MOTRIN) 200 MG tablet Take 400-600 mg by mouth every 6 (six) hours as needed for pain.    Marland Kitchen levonorgestrel-ethinyl estradiol (AVIANE,ALESSE,LESSINA) 0.1-20 MG-MCG tablet Take 1 tablet by mouth daily. Take only active pills    . NEXIUM 40 MG capsule TAKE 1 CAPSULE BY MOUTH ONCE DAILY BEFORE BREAKFAST 90 capsule 3  . Omega-3 Fatty Acids (FISH OIL PO) Take 1 capsule by mouth daily. Omega Red    . Probiotic Product (ALIGN PO) Take 1 tablet by mouth daily.    . ranitidine (ZANTAC) 300 MG tablet Take 1 tablet (300 mg total) by mouth at bedtime. 90 tablet 3  . lubiprostone (AMITIZA) 8 MCG capsule Take 1 capsule (8 mcg total) by mouth 2 (two) times daily with a meal. 60 capsule 3  . peg 3350 powder (MOVIPREP) 100 G SOLR Take 1 kit (200 g total) by  mouth once. 1 kit 0   No current facility-administered medications for this visit.   No Known Allergies   Review of Systems: Gen: Denies any fever, chills, sweats, anorexia, fatigue, weakness, malaise, weight loss, and sleep disorder CV: Denies chest pain, angina, palpitations, syncope,  orthopnea, PND, peripheral edema, and claudication. Resp: Denies dyspnea at rest, dyspnea with exercise, cough, sputum, wheezing, coughing up blood, and pleurisy. GI: Denies vomiting blood, jaundice, and fecal incontinence.  Has dysphagia to liquids and has a globus sensation GU : Denies urinary burning, blood in urine, urinary frequency, urinary hesitancy, nocturnal urination, and urinary incontinence. MS: Denies joint pain, limitation of movement, and swelling, stiffness, low back pain, extremity pain. Denies muscle weakness, cramps, atrophy.  Derm: Denies rash, itching, dry skin, hives, moles, warts, or unhealing ulcers.  Psych: Denies depression, anxiety, memory loss, suicidal ideation, hallucinations, paranoia, and confusion. Heme: Denies bruising, bleeding, and enlarged lymph nodes. Neuro:  Denies any headaches, dizziness, paresthesias. Endo:  Denies any problems with DM, thyroid, adrenal function   LAB RESULTS: CBC 08/19/2014 white count 7, hemoglobin 15.3, hematocrit 46.4, platelets 271,000, MCV 93.6. TSH 08/19/2014 1.30 Comprehensive metabolic panel 27/74/1287 total bili 0.8, alkaline phosphatase 58, AST 22, ALT 39, albumin 4.4.    Physical Exam: BP 120/80 mmHg  Pulse 72  Ht 5' 6.5" (1.689 m)  Wt 171 lb (77.565 kg)  BMI 27.19 kg/m2 Constitutional: Pleasant,well-developed, female in no acute distress. HEENT: Normocephalic and atraumatic. Conjunctivae are normal. No scleral icterus. Neck supple.  Cardiovascular: Normal rate, regular rhythm.  Pulmonary/chest: Effort normal and breath sounds normal. No wheezing, rales or rhonchi. Abdominal: Soft, nondistended, nontender. Bowel sounds active  throughout. There are no masses palpable. No hepatomegaly. Extremities: no edema Lymphadenopathy: No cervical adenopathy noted. Neurological: Alert and oriented to person place and time. Skin: Skin is warm and dry. No rashes noted. Psychiatric: Normal mood and affect. Behavior is normal.  ASSESSMENT AND PLAN: #1. GERD. An antireflux regimen will be reviewed. She will continue her current regimen of Nexium and ranitidine. #2. Dysphagia. Patient has a history of a Schatzki's ring but reports that she is now feeling liquids are getting stuck in the cervical region and she complains of a globus sensation. She will be scheduled for a modified barium swallow with speech pathology. She will then be scheduled for an EGD with possible dilation.The risks, benefits, and alternatives to endoscopy with possible biopsy and possible dilation were discussed with the patient and they consent to proceed.  #3. Personal history of colon polyps and family history of colon cancer. Patient has signed a medical release to obtain records of previous colonoscopy and pathology. She will be scheduled for surveillance colonoscopy to evaluate for recurrent polyps or neoplasia.The risks, benefits, and alternatives to colonoscopy with possible biopsy and possible polypectomy were discussed with the patient and they consent to proceed.  #4. Constipation. Patient has tried dietary changes and over-the-counter laxity is with little relief. She will be given a trial of Amitiza  at 8 g twice daily. Follow-up recommendations will be made pending the findings of the above. Her procedures will be scheduled with Dr. Ardis Hughs.    Jennifer Sharp, Vita Barley PA-C 09/28/2014, 12:10 PM  CC: Mosie Lukes, MD  10/11/14 Rec notes from [place]. Pt had colonoscopy 01/23/10 by Dr Cristina Gong. Normal colonoscopy. Advised to repeat in 5 years due to family hx colon cancer. EGD 01/23/10 normal.

## 2014-09-28 NOTE — Progress Notes (Signed)
i agree with the above note, plan 

## 2014-10-03 ENCOUNTER — Other Ambulatory Visit (HOSPITAL_COMMUNITY): Payer: Self-pay | Admitting: Physician Assistant

## 2014-10-03 DIAGNOSIS — R1314 Dysphagia, pharyngoesophageal phase: Secondary | ICD-10-CM

## 2014-10-04 ENCOUNTER — Ambulatory Visit (HOSPITAL_COMMUNITY): Payer: 59

## 2014-10-11 ENCOUNTER — Ambulatory Visit (HOSPITAL_COMMUNITY)
Admission: RE | Admit: 2014-10-11 | Discharge: 2014-10-11 | Disposition: A | Discharge status: Home / Self Care | Payer: 59 | Source: Ambulatory Visit | Attending: Physician Assistant | Admitting: Physician Assistant

## 2014-10-11 DIAGNOSIS — K219 Gastro-esophageal reflux disease without esophagitis: Secondary | ICD-10-CM

## 2014-10-11 DIAGNOSIS — R1314 Dysphagia, pharyngoesophageal phase: Secondary | ICD-10-CM | POA: Insufficient documentation

## 2014-10-11 DIAGNOSIS — R1310 Dysphagia, unspecified: Secondary | ICD-10-CM

## 2014-11-28 ENCOUNTER — Encounter: Payer: Self-pay | Admitting: Gastroenterology

## 2014-11-28 ENCOUNTER — Ambulatory Visit (AMBULATORY_SURGERY_CENTER): Payer: 59 | Admitting: Gastroenterology

## 2014-11-28 VITALS — BP 107/53 | HR 70 | Temp 97.2°F | Resp 26 | Ht 66.0 in | Wt 171.0 lb

## 2014-11-28 DIAGNOSIS — Z800 Family history of malignant neoplasm of digestive organs: Secondary | ICD-10-CM

## 2014-11-28 DIAGNOSIS — R1310 Dysphagia, unspecified: Secondary | ICD-10-CM

## 2014-11-28 DIAGNOSIS — Z1211 Encounter for screening for malignant neoplasm of colon: Secondary | ICD-10-CM | POA: Diagnosis present

## 2014-11-28 MED ORDER — SODIUM CHLORIDE 0.9 % IV SOLN: 500.0000 mL | INTRAVENOUS | Status: DC

## 2014-11-28 MED ORDER — LINACLOTIDE 145 MCG PO CAPS: 145.0000 ug | ORAL_CAPSULE | Freq: Every day | ORAL | Status: DC

## 2014-11-28 NOTE — Op Note (Signed)
[facility]  [address] [place], [address]   ENDOSCOPY PROCEDURE REPORT  PATIENT: Jennifer Sharp, Jennifer Sharp  MR#: [id number] BIRTHDATE: April 27, 1960 , 50  yrs. old GENDER: female ENDOSCOPIST: Milus Banister, MD REFERRED BY:  Dr. Anitra Lauth PROCEDURE DATE:  11/28/2014 PROCEDURE:  EGD, diagnostic ASA CLASS:     Class II INDICATIONS:  globus, dysphagia. MEDICATIONS: Monitored anesthesia care and Propofol 150 mg IV TOPICAL ANESTHETIC: none  DESCRIPTION OF PROCEDURE: After the risks benefits and alternatives of the procedure were thoroughly explained, informed consent was obtained.  The [id number] endoscope was introduced through the mouth and advanced to the second portion of the duodenum , Without limitations.  The instrument was slowly withdrawn as the mucosa was fully examined.   EXAM: The esophagus and gastroesophageal junction were completely normal in appearance.  The stomach was entered and closely examined.The antrum, angularis, and lesser curvature were well visualized, including a retroflexed view of the cardia and fundus. The stomach wall was normally distensable.  The scope passed easily through the pylorus into the duodenum.  Retroflexed views revealed no abnormalities.     The scope was then withdrawn from the patient and the procedure completed.  COMPLICATIONS: There were no immediate complications.  ENDOSCOPIC IMPRESSION: Normal appearing esophagus and GE junction, the stomach was well visualized and normal in appearance, normal appearing duodenum  RECOMMENDATIONS: Chew you food well, eat slowly and take small bites.   eSigned:  Milus Banister, MD 11/28/2014 2:35 PM

## 2014-11-28 NOTE — Progress Notes (Signed)
To recovery, report to Hod[place], RN, VSS 

## 2014-11-28 NOTE — Op Note (Signed)
[place]  [address] [place], [id number]   COLONOSCOPY PROCEDURE REPORT  PATIENT: Jennifer, Sharp  MR#: [id number] BIRTHDATE: [birth year] , 89  yrs. old GENDER: female ENDOSCOPIST: Milus Banister, MD REFERRED KZ:[id number] Anitra Lauth, M.D. PROCEDURE DATE:  11/28/2014 PROCEDURE:   Colonoscopy, screening First Screening Colonoscopy - Avg.  risk and is 50 yrs.  old or older - No.  Prior Negative Screening - Now for repeat screening. Above average risk  History of Adenoma - Now for follow-up colonoscopy & has been > or = to 3 yrs.  N/A  high risk ASA CLASS:   Class II INDICATIONS:Screening for colonic neoplasia, FH Colon or Rectal Adenocarcinoma, and father had colon cancer, so did several of his siblings. MEDICATIONS: Monitored anesthesia care and Propofol 350 mg IV  DESCRIPTION OF PROCEDURE:   After the risks benefits and alternatives of the procedure were thoroughly explained, informed consent was obtained.  The digital rectal exam revealed no abnormalities of the rectum.   The LB [id number]  endoscope was introduced through the anus and advanced to the cecum, which was identified by both the appendix and ileocecal valve. No adverse events experienced.   The quality of the prep was excellent.  The instrument was then slowly withdrawn as the colon was fully examined. Estimated blood loss is zero unless otherwise noted in this procedure report.   COLON FINDINGS: A normal appearing cecum, ileocecal valve, and appendiceal orifice were identified.  The ascending, transverse, descending, sigmoid colon, and rectum appeared unremarkable. Retroflexed views revealed no abnormalities. The time to cecum = 7.7 Withdrawal time = 6.7   The scope was withdrawn and the procedure completed. COMPLICATIONS: There were no immediate complications.  ENDOSCOPIC IMPRESSION: Normal colonoscopy No polyps or cancers  RECOMMENDATIONS: Given your significant family history of  colon cancer (father), you should have a repeat colonoscopy in 5 years Trial of linzess for your chronic constipation (new script called in today).  eSigned:  Milus Banister, MD 11/28/2014 2:33 PM

## 2014-11-28 NOTE — Patient Instructions (Signed)

## 2014-11-28 NOTE — Progress Notes (Signed)
Pt c/o joint pain after taking Amitiza , started August 10th,2016 Will discuss with Dr. Ardis Hughs

## 2014-11-29 ENCOUNTER — Telehealth: Payer: Self-pay | Admitting: Emergency Medicine

## 2014-11-29 NOTE — Telephone Encounter (Signed)
  Follow up Call-  Call back number 11/28/2014  Post procedure Call Back phone  # [phone]  Permission to leave phone message Yes     Patient questions:  Do you have a fever, pain , or abdominal swelling? No. Pain Score  0 *  Have you tolerated food without any problems? Yes.    Have you been able to return to your normal activities? Yes.    Do you have any questions about your discharge instructions: Diet   No. Medications  No. Follow up visit  No.  Do you have questions or concerns about your Care? No.  Actions: * If pain score is 4 or above: No action needed, pain <4.

## 2015-01-16 ENCOUNTER — Telehealth: Payer: Self-pay | Admitting: Physician Assistant

## 2015-01-16 ENCOUNTER — Other Ambulatory Visit: Payer: Self-pay

## 2015-01-16 MED ORDER — LINACLOTIDE 290 MCG PO CAPS: 290.0000 ug | ORAL_CAPSULE | Freq: Every day | ORAL | Status: DC

## 2015-01-16 NOTE — Telephone Encounter (Signed)
Pt spoekt o me atg [employer] where she works. Sais amitiza caused joint pain. She sais Dr Ardis Hughs sent in a script for linzess 145, and it is not working. Will have office send in script for 290 mcg linzess. Pt instructed to call us in a week or 2 and let us know if it is helping.

## 2015-02-28 MED FILL — raNITIdine HCL 300 MG TABS: 300 | 90 days supply | Qty: 90 | Fill #2

## 2015-04-03 DIAGNOSIS — N926 Irregular menstruation, unspecified: Secondary | ICD-10-CM | POA: Diagnosis not present

## 2015-04-03 MED FILL — MEDROXYPROGESTERONE 10 MG T: 10 | 14 days supply | Qty: 30 | Fill #0

## 2015-04-04 ENCOUNTER — Encounter: Payer: Self-pay | Admitting: Family Medicine

## 2015-04-07 MED FILL — NORETHINDRONE 5 MG TABLET: 5 | 17 days supply | Qty: 20 | Fill #0

## 2015-04-11 ENCOUNTER — Other Ambulatory Visit: Payer: Self-pay | Admitting: Obstetrics and Gynecology

## 2015-04-11 DIAGNOSIS — N72 Inflammatory disease of cervix uteri: Secondary | ICD-10-CM | POA: Diagnosis not present

## 2015-04-11 DIAGNOSIS — N840 Polyp of corpus uteri: Secondary | ICD-10-CM | POA: Diagnosis not present

## 2015-04-11 DIAGNOSIS — N920 Excessive and frequent menstruation with regular cycle: Secondary | ICD-10-CM | POA: Diagnosis not present

## 2015-04-11 DIAGNOSIS — N926 Irregular menstruation, unspecified: Secondary | ICD-10-CM | POA: Diagnosis not present

## 2015-04-11 LAB — HM PAP SMEAR: HM PAP: NORMAL

## 2015-04-13 ENCOUNTER — Encounter: Payer: Self-pay | Admitting: Family Medicine

## 2015-04-19 ENCOUNTER — Other Ambulatory Visit: Payer: Self-pay | Admitting: Obstetrics and Gynecology

## 2015-04-19 NOTE — Patient Instructions (Signed)
Your procedure is scheduled on:  Friday, April 21, 2015  Enter through the Main Entrance of [facility] at:  12:45 PM  Pick up the phone at the desk and dial [phone].  Call this number if you have problems the morning of surgery: [phone].  Remember:  Do NOT eat food:  After Midnight Tonight  Do NOT drink clear liquids after:  10:00 AM day of surgery  Take these medicines the morning of surgery with a SIP OF WATER:  Nexium  Stop taking Fish oil at this time  Do NOT wear jewelry (body piercing), metal hair clips/bobby pins, make-up, or nail polish. Do NOT wear lotions, powders, or perfumes.  You may wear deoderant. Do NOT shave for 48 hours prior to surgery. Do NOT bring valuables to the hospital. Contacts, dentures, or bridgework may not be worn into surgery.  Have a responsible adult drive you home and stay with you for 24 hours after your procedure

## 2015-04-20 ENCOUNTER — Encounter (HOSPITAL_COMMUNITY): Payer: Self-pay

## 2015-04-20 ENCOUNTER — Encounter (HOSPITAL_COMMUNITY)
Admission: RE | Admit: 2015-04-20 | Discharge: 2015-04-20 | Disposition: A | Discharge status: Home / Self Care | Payer: 59 | Source: Ambulatory Visit | Attending: Obstetrics and Gynecology | Admitting: Obstetrics and Gynecology

## 2015-04-20 DIAGNOSIS — K219 Gastro-esophageal reflux disease without esophagitis: Secondary | ICD-10-CM | POA: Diagnosis not present

## 2015-04-20 DIAGNOSIS — N924 Excessive bleeding in the premenopausal period: Secondary | ICD-10-CM | POA: Diagnosis present

## 2015-04-20 DIAGNOSIS — N840 Polyp of corpus uteri: Secondary | ICD-10-CM | POA: Diagnosis present

## 2015-04-20 DIAGNOSIS — N939 Abnormal uterine and vaginal bleeding, unspecified: Secondary | ICD-10-CM | POA: Diagnosis not present

## 2015-04-20 DIAGNOSIS — G4730 Sleep apnea, unspecified: Secondary | ICD-10-CM | POA: Diagnosis not present

## 2015-04-20 HISTORY — DX: Sacroiliitis, not elsewhere classified: M46.1

## 2015-04-20 HISTORY — DX: Complete or unspecified spontaneous abortion without complication: O03.9

## 2015-04-20 LAB — CBC
HCT: 37.3 % (ref 36.0–46.0)
Hemoglobin: 12.2 g/dL (ref 12.0–15.0)
MCH: 30.3 pg (ref 26.0–34.0)
MCHC: 32.7 g/dL (ref 30.0–36.0)
MCV: 92.8 fL (ref 78.0–100.0)
PLATELETS: 368 10*3/uL (ref 150–400)
RBC: 4.02 MIL/uL (ref 3.87–5.11)
RDW: 13.7 % (ref 11.5–15.5)
WBC: 9.8 10*3/uL (ref 4.0–10.5)

## 2015-04-20 NOTE — H&P (Addendum)
Jennifer Sharp is an 55 y.o. female. Who presents for further evaluation of abnormal uterine bleeding and probable endometrial polyp.  The patient had her menses regulated for a number of years with oral contraceptive pills. She subsequently became on on the pills, but did not have an elevation in her [employer] that was consistent with menopause. Given that she was no longer having the abnormal bleeding for which she had used the pills. These were discontinued that she did not use them for contraception. Within the last several weeks. The patient had the onset of heavy vaginal bleeding, which did respond to progesterone. At the time of endometrial biopsy. No abnormal cells but an endometrial polyp was identified.  Pertinent Gynecological History: Menses: Has been amenorrheic for over a year Bleeding: Perimenopausal abnormal uterine bleeding Contraception: vasectomy DES exposure: unknown Blood transfusions: none Sexually transmitted diseases: no past history Previous GYN Procedures: cryo  Last mammogram: normal Date: 2016 Last pap: normal Date: 2017 OB History: G4, P   Menstrual History: Menarche age: 36  No LMP recorded. Patient is postmenopausal.    Past Medical History  Diagnosis Date  . Seasonal allergic rhinitis   . Melanoma ([place]) 2006    on right arm  . Hyperlipidemia   . Edema   . Fatigue 07/25/2010  . Overweight(278.02) 07/25/2010  . GERD (gastroesophageal reflux disease) 07/25/2010    with schatzki's ring on endo 07/2010  . Colon polyps 07/25/2010  . History of recurrent UTIs 07/25/2010  . Umbilical hernia [id number]  . Candidiasis of skin 06/10/2011  . Tachycardia     H/O transient  tach. as a chlid   . Unspecified constipation 07/19/2012  . OSA on CPAP 10/02/2011  . Ankle swelling     occasional   . Family history of colon cancer   . Constipation   . DUB (dysfunctional uterine bleeding) 2016/2017    GYN did endometrial bx + pap 04/11/15; u/s to follow  . Ankle swelling     occassional   . Premenstrual migraine     headaches since bleeding started  . Miscarriage   . SI (sacroiliac) joint inflammation ([place])   . Complication of anesthesia     urinary retention    Past Surgical History  Procedure Laterality Date  . Oral surgeries  70's and 80's  . Dilation and curettage of uterus  [phone]  . Melanoma excision  2006    R arm  . Tonsillectomy      childhood  . Hernia repair      left inguinal  . Exploratory laparotomy    . Ventral hernia repair N/A 10/29/2012    Procedure: LAPAROSCOPIC VENTRAL HERNIA WITH MESH;  Surgeon: Shann Medal, MD;  Location: [facility] ORS;  Service: General;  Laterality: N/A;  . Colonoscopy  02/2010    5 yr recall (FH colon cancer)    Family History  Problem Relation Age of Onset  . Hyperlipidemia Mother   . Hypertension Mother   . Cancer Father 27    ish, colon- remission/ skin cancer  . Other Brother     stent placed  . Hypertension Brother   . Hyperlipidemia Brother   . Heart disease Brother     2 cardiac stents  . Other Daughter     heart ablation/ heart arrythmia  . ADD / ADHD Daughter     ADD  . Heart disease Daughter 64    tachyarrythmia, scheduled for ablation   . ADD / ADHD Son  ADD  . Cancer Paternal Uncle     colon  . Diabetes Maternal Grandmother   . Stroke Maternal Grandmother   . Hypertension Maternal Grandmother   . Alzheimer's disease Maternal Grandmother   . Diabetes Maternal Grandfather   . Other Maternal Grandfather     cardiac disease  . Cancer Paternal Grandmother     colon  . Allergy (severe) Brother     smoker  . Other Brother     respiratory problems    Social History:  reports that she has never smoked. She has never used smokeless tobacco. She reports that she drinks alcohol. She reports that she does not use illicit drugs.  Allergies: No Known Allergies  Prescriptions prior to admission  Medication Sig Dispense Refill Last Dose  . ferrous sulfate 325 (65 FE) MG tablet Take 325 mg by  mouth daily with breakfast.     . ibuprofen (ADVIL,MOTRIN) 200 MG tablet Take 400-600 mg by mouth every 6 (six) hours as needed for pain.   04/21/2015 at Unknown time  . mineral oil liquid Take 15 mLs by mouth daily as needed for mild constipation or moderate constipation.     . Multiple Vitamin (MULTIVITAMIN WITH MINERALS) TABS tablet Take 1 tablet by mouth daily.     Marland Kitchen NEXIUM 40 MG capsule TAKE 1 CAPSULE BY MOUTH ONCE DAILY BEFORE BREAKFAST (Patient taking differently: Take 40 mg by mouth daily at 12 noon. TAKE 1 CAPSULE BY MOUTH ONCE DAILY BEFORE BREAKFAST) 90 capsule 3 04/21/2015 at Unknown time  . calcium carbonate (OS-CAL) 600 MG TABS Take 600 mg by mouth daily.    Past Week  . diclofenac (VOLTAREN) 75 MG EC tablet TAKE 1 TABLET BY MOUTH TWICE A DAY FOR 7 DAYS, THEN TAKE AS NEEDED (Patient not taking: Reported on 11/28/2014) 40 tablet 6 Not Taking at Unknown time  . Linaclotide (LINZESS) 145 MCG CAPS capsule Take 1 capsule (145 mcg total) by mouth daily. (Patient not taking: Reported on 04/18/2015) 30 capsule 3 Not Taking at Unknown time  . Linaclotide (LINZESS) 290 MCG CAPS capsule Take 1 capsule (290 mcg total) by mouth daily. (Patient not taking: Reported on 04/18/2015) 90 capsule 0 Not Taking at Unknown time  . Omega-3 Fatty Acids (FISH OIL PO) Take 1 capsule by mouth daily. Omega Red   Past Week  . ranitidine (ZANTAC) 300 MG tablet Take 1 tablet (300 mg total) by mouth at bedtime. 90 tablet 3 Past Week    Review of Systems  Constitutional: Negative.   HENT: Negative.   Eyes: Negative.   Respiratory: Negative.   Cardiovascular: Negative.   Gastrointestinal: Negative.   Genitourinary: Negative.   Musculoskeletal: Negative.   Skin: Negative.   Neurological: Negative.   Psychiatric/Behavioral: Negative.     Blood pressure 125/92, pulse 73, temperature 97.6 F (36.4 C), temperature source Oral, resp. rate 16, SpO2 100 %. Physical Exam  Constitutional: She appears well-developed and  well-nourished.  HENT:  Head: Normocephalic and atraumatic.  Eyes: EOM are normal.  Neck: Normal range of motion. Neck supple.  Cardiovascular: Normal rate.   Respiratory: Effort normal.  GI: Soft.  Genitourinary:  Pelvic exam:  VULVA: normal appearing vulva with no masses, tenderness or lesions,  VAGINA: normal appearing vagina with normal color and discharge, no lesions,  CERVIX: normal appearing cervix without discharge or lesions, multiparous os,  UTERUS: uterus is normal size, shape, consistency and nontender,  ADNEXA: normal adnexa in size, nontender and no masses.   Office endometrial  biopsy with suggestion of endometrial polyp  Assessment/Plan: 1) abnormal uterine bleeding, most consistent with perimenopausal bleeding 2) endometrial polyp on endometrial biopsy  Recommendation: A discussion was held with the patient concerning options for management, which include observation, return to oral contraceptive pills for control of this bleeding, and hysteroscopy, D&C for evaluation of any further, endometrial lesions. The patient chose the latter. The risks of anesthesia, bleeding, infection, damage to adjacent organs and uterine perforation were all reviewed and patient wishes to proceed    Deandrea Vanpelt P 04/21/2015, 2:12 PM

## 2015-04-21 ENCOUNTER — Encounter (HOSPITAL_COMMUNITY): Payer: Self-pay

## 2015-04-21 ENCOUNTER — Ambulatory Visit (HOSPITAL_COMMUNITY)
Admission: RE | Admit: 2015-04-21 | Discharge: 2015-04-21 | Disposition: A | Discharge status: Home / Self Care | Payer: 59 | Source: Ambulatory Visit | Attending: Obstetrics and Gynecology | Admitting: Obstetrics and Gynecology

## 2015-04-21 ENCOUNTER — Ambulatory Visit (HOSPITAL_COMMUNITY): Payer: 59 | Admitting: Anesthesiology

## 2015-04-21 ENCOUNTER — Encounter (HOSPITAL_COMMUNITY)
Admission: RE | Disposition: A | Discharge status: Home / Self Care | Payer: Self-pay | Source: Ambulatory Visit | Attending: Obstetrics and Gynecology

## 2015-04-21 DIAGNOSIS — N939 Abnormal uterine and vaginal bleeding, unspecified: Secondary | ICD-10-CM | POA: Insufficient documentation

## 2015-04-21 DIAGNOSIS — N938 Other specified abnormal uterine and vaginal bleeding: Secondary | ICD-10-CM | POA: Diagnosis not present

## 2015-04-21 DIAGNOSIS — N840 Polyp of corpus uteri: Secondary | ICD-10-CM | POA: Diagnosis not present

## 2015-04-21 DIAGNOSIS — N924 Excessive bleeding in the premenopausal period: Secondary | ICD-10-CM | POA: Diagnosis present

## 2015-04-21 DIAGNOSIS — G4730 Sleep apnea, unspecified: Secondary | ICD-10-CM | POA: Diagnosis not present

## 2015-04-21 DIAGNOSIS — K219 Gastro-esophageal reflux disease without esophagitis: Secondary | ICD-10-CM | POA: Diagnosis not present

## 2015-04-21 HISTORY — PX: DILATATION & CURRETTAGE/HYSTEROSCOPY WITH RESECTOCOPE: SHX5572

## 2015-04-21 HISTORY — DX: Adverse effect of unspecified anesthetic, initial encounter: T41.45XA

## 2015-04-21 HISTORY — DX: Other complications of anesthesia, initial encounter: T88.59XA

## 2015-04-21 HISTORY — PX: DILATION AND EVACUATION: SHX1459

## 2015-04-21 SURGERY — DILATATION & CURETTAGE/HYSTEROSCOPY WITH RESECTOCOPE
Anesthesia: General | Site: Vagina

## 2015-04-21 MED ORDER — PROPOFOL 10 MG/ML IV BOLUS
INTRAVENOUS | Status: DC | PRN
  Administered 2015-04-21: 150 mg via INTRAVENOUS
  Administered 2015-04-21: 50 mg via INTRAVENOUS

## 2015-04-21 MED ORDER — DEXAMETHASONE SODIUM PHOSPHATE 10 MG/ML IJ SOLN
INTRAMUSCULAR | Status: AC
Start: 2015-04-21 — End: 2015-04-21
  Filled 2015-04-21: qty 1

## 2015-04-21 MED ORDER — METOCLOPRAMIDE HCL 5 MG/ML IJ SOLN
INTRAMUSCULAR | Status: DC | PRN
  Administered 2015-04-21: 10 mg via INTRAVENOUS

## 2015-04-21 MED ORDER — SCOPOLAMINE 1 MG/3DAYS TD PT72
1.0000 | MEDICATED_PATCH | Freq: Once | TRANSDERMAL | Status: DC
Start: 2015-04-21 — End: 2015-04-21
  Administered 2015-04-21: 1.5 mg via TRANSDERMAL

## 2015-04-21 MED ORDER — SODIUM CHLORIDE 0.9 % IR SOLN
Status: DC | PRN
  Administered 2015-04-21: 3000 mL

## 2015-04-21 MED ORDER — ONDANSETRON HCL 4 MG/2ML IJ SOLN
INTRAMUSCULAR | Status: AC
Start: 2015-04-21 — End: 2015-04-21
  Filled 2015-04-21: qty 2

## 2015-04-21 MED ORDER — MIDAZOLAM HCL 5 MG/5ML IJ SOLN
INTRAMUSCULAR | Status: DC | PRN
  Administered 2015-04-21: 2 mg via INTRAVENOUS

## 2015-04-21 MED ORDER — LIDOCAINE HCL (CARDIAC) 20 MG/ML IV SOLN
INTRAVENOUS | Status: AC
  Filled 2015-04-21: qty 5

## 2015-04-21 MED ORDER — LACTATED RINGERS IV SOLN: INTRAVENOUS | Status: DC

## 2015-04-21 MED ORDER — DEXAMETHASONE SODIUM PHOSPHATE 10 MG/ML IJ SOLN
INTRAMUSCULAR | Status: DC | PRN
  Administered 2015-04-21: 10 mg via INTRAVENOUS

## 2015-04-21 MED ORDER — LIDOCAINE HCL 2 % IJ SOLN
INTRAMUSCULAR | Status: DC | PRN
  Administered 2015-04-21: 10 mL

## 2015-04-21 MED ORDER — LIDOCAINE HCL 2 % IJ SOLN
INTRAMUSCULAR | Status: AC
  Filled 2015-04-21: qty 20

## 2015-04-21 MED ORDER — SCOPOLAMINE 1 MG/3DAYS TD PT72
MEDICATED_PATCH | TRANSDERMAL | Status: AC
  Administered 2015-04-21: 1.5 mg via TRANSDERMAL
  Filled 2015-04-21: qty 1

## 2015-04-21 MED ORDER — KETOROLAC TROMETHAMINE 30 MG/ML IJ SOLN
INTRAMUSCULAR | Status: DC | PRN
  Administered 2015-04-21: 30 mg via INTRAVENOUS

## 2015-04-21 MED ORDER — LACTATED RINGERS IV SOLN
INTRAVENOUS | Status: DC
  Administered 2015-04-21 (×2): via INTRAVENOUS

## 2015-04-21 MED ORDER — PROPOFOL 10 MG/ML IV BOLUS
INTRAVENOUS | Status: AC
  Filled 2015-04-21: qty 20

## 2015-04-21 MED ORDER — IBUPROFEN 600 MG PO TABS: ORAL_TABLET | ORAL | Status: DC

## 2015-04-21 MED ORDER — FENTANYL CITRATE (PF) 100 MCG/2ML IJ SOLN
INTRAMUSCULAR | Status: DC | PRN
  Administered 2015-04-21 (×2): 25 ug via INTRAVENOUS
  Administered 2015-04-21: 50 ug via INTRAVENOUS

## 2015-04-21 MED ORDER — FENTANYL CITRATE (PF) 100 MCG/2ML IJ SOLN: 25.0000 ug | INTRAMUSCULAR | Status: DC | PRN

## 2015-04-21 MED ORDER — KETOROLAC TROMETHAMINE 30 MG/ML IJ SOLN
INTRAMUSCULAR | Status: AC
  Filled 2015-04-21: qty 1

## 2015-04-21 MED ORDER — LIDOCAINE HCL (CARDIAC) 20 MG/ML IV SOLN
INTRAVENOUS | Status: DC | PRN
  Administered 2015-04-21: 40 mg via INTRAVENOUS

## 2015-04-21 MED ORDER — ONDANSETRON HCL 4 MG/2ML IJ SOLN
INTRAMUSCULAR | Status: DC | PRN
  Administered 2015-04-21: 4 mg via INTRAVENOUS

## 2015-04-21 MED ORDER — FENTANYL CITRATE (PF) 100 MCG/2ML IJ SOLN
INTRAMUSCULAR | Status: AC
  Filled 2015-04-21: qty 2

## 2015-04-21 MED ORDER — MEPERIDINE HCL 25 MG/ML IJ SOLN: 6.2500 mg | INTRAMUSCULAR | Status: DC | PRN

## 2015-04-21 MED ORDER — MIDAZOLAM HCL 2 MG/2ML IJ SOLN
INTRAMUSCULAR | Status: AC
  Filled 2015-04-21: qty 2

## 2015-04-21 MED ORDER — PROMETHAZINE HCL 25 MG/ML IJ SOLN: 6.2500 mg | INTRAMUSCULAR | Status: DC | PRN

## 2015-04-21 SURGICAL SUPPLY — 29 items
BOOTIES KNEE HIGH SLOAN (MISCELLANEOUS) ×4 IMPLANT
CANISTER SUCT 3000ML (MISCELLANEOUS) ×2 IMPLANT
CATH ROBINSON RED A/P 16FR (CATHETERS) ×2 IMPLANT
CLOTH BEACON ORANGE TIMEOUT ST (SAFETY) ×2 IMPLANT
CONTAINER PREFILL 10% NBF 60ML (FORM) ×4 IMPLANT
CORD ACTIVE DISPOSABLE (ELECTRODE) ×1
CORD ELECTRO ACTIVE DISP (ELECTRODE) ×1 IMPLANT
DILATOR CANAL MILEX (MISCELLANEOUS) IMPLANT
ELECT LOOP GYNE PRO 24FR (CUTTING LOOP)
ELECT REM PT RETURN 9FT ADLT (ELECTROSURGICAL) ×2
ELECT VAPORTRODE GRVD BAR (ELECTRODE) IMPLANT
ELECTRODE LOOP GYNE PRO 24FR (CUTTING LOOP) IMPLANT
ELECTRODE REM PT RTRN 9FT ADLT (ELECTROSURGICAL) ×1 IMPLANT
ELECTRODE ROLLER VERSAPOINT (ELECTRODE) IMPLANT
ELECTRODE RT ANGLE VERSAPOINT (CUTTING LOOP) IMPLANT
ELECTRODE TWIZZLE TIP (MISCELLANEOUS) IMPLANT
GLOVE BIOGEL PI IND STRL 7.0 (GLOVE) ×1 IMPLANT
GLOVE BIOGEL PI INDICATOR 7.0 (GLOVE) ×1
GLOVE SURG SS PI 6.5 STRL IVOR (GLOVE) ×4 IMPLANT
GOWN STRL REUS W/TWL LRG LVL3 (GOWN DISPOSABLE) ×4 IMPLANT
LOOP ANGLED CUTTING 22FR (CUTTING LOOP) IMPLANT
PACK VAGINAL MINOR WOMEN LF (CUSTOM PROCEDURE TRAY) ×2 IMPLANT
PAD OB MATERNITY 4.3X12.25 (PERSONAL CARE ITEMS) ×2 IMPLANT
SET BERKELEY SUCTION TUBING (SUCTIONS) ×2 IMPLANT
TOWEL OR 17X24 6PK STRL BLUE (TOWEL DISPOSABLE) ×4 IMPLANT
TUBING AQUILEX INFLOW (TUBING) ×2 IMPLANT
TUBING AQUILEX OUTFLOW (TUBING) ×2 IMPLANT
VACURETTE 8 RIGID CVD (CANNULA) ×2 IMPLANT
WATER STERILE IRR 1000ML POUR (IV SOLUTION) ×2 IMPLANT

## 2015-04-21 NOTE — Op Note (Signed)
04/21/2015  3:45 PM  PATIENT:  Jennifer Sharp  55 y.o. female  PRE-OPERATIVE DIAGNOSIS:  Uterine Polyp  POST-OPERATIVE DIAGNOSIS:  Uterine Polyp  PROCEDURE:  Procedure(s): DILATATION & CURETTAGE/HYSTEROSCOPY WITH RESECTOCOPE DILATATION AND EVACUATION  SURGEON:  Coral Timme P, MD  ASSISTANTS: None  ANESTHESIA:   general  ESTIMATED BLOOD LOSS: Minimal  BLOOD ADMINISTERED:none  COMPLICATIONS: None  FINDINGS: The uterus sounded to 9 cm. At the time of hysteroscopy. No distinct lesions could be identified. The endometrial cavity was coated with a fluffy lining with intermittent defects. The tubal ostia were identified bilaterally. A moderate amount of tissue was obtained at curettage  FLUID DEFICIT: 1 25 cc  LOCAL MEDICATIONS USED:  XYLOCAINE  and Amount: 10 ml  SPECIMEN:  Source of Specimen:  Endometrial curettings  DISPOSITION OF SPECIMEN:  PATHOLOGY  COUNTS:  YES  DESCRIPTION OF PROCEDURE:the patient was taken to the operating room after appropriate identification and placed on the operating table. After the attainment of adequate general anesthesia she was placed in the lithotomy position.  A timeout was performed. The perineum and vagina were prepped with multiple layers of Betadine. The bladder was emptied with a an in and out catheter. The perineum was draped in sterile field. A gray speculum was placed in the vagina. The cervix was grasped with a single-tooth tenaculum. A paracervical block was achieved with a total of 10 cc of 2% Xylocaine and the 5 and 7:00 positions. The uterus was sounded.  The cervix was already adequately dilated to accommodate the diagnostic hysteroscope. The hysteroscope was used to evaluate all quadrants of the uterus. A sharp curet was used to curet all quadrants of the uterus. A suction curette was used to remove tissue from the endometrial cavity. A repeat hysteroscopy showed that the cavity had been cleaned of much of the tissue which had  been previously noted. All instruments were then removed from the vagina and the patient was awakened from general anesthesia and taken to the recovery room in satisfactory condition having tolerated the procedure well sponge and instrument counts correct.  PLAN OF CARE: Discharge home after postanesthesia care  PATIENT DISPOSITION:  PACU - hemodynamically stable.   Delay start of Pharmacological VTE agent (>24hrs) due to surgical blood loss or risk of bleeding:  Yes.  SCDs were used during the case   Eldred Manges, MD 3:45 PM

## 2015-04-21 NOTE — Anesthesia Preprocedure Evaluation (Signed)
Anesthesia Evaluation  Patient identified by MRN, date of birth, ID band Patient awake    Reviewed: Allergy & Precautions, H&P , NPO status , Patient's Chart, lab work & pertinent test results  History of Anesthesia Complications (+) PONV  Airway Mallampati: III  TM Distance: <3 FB Neck ROM: Full    Dental no notable dental hx.    Pulmonary sleep apnea and Continuous Positive Airway Pressure Ventilation ,    Pulmonary exam normal breath sounds clear to auscultation       Cardiovascular negative cardio ROS Normal cardiovascular exam Rhythm:Regular Rate:Normal     Neuro/Psych negative neurological ROS  negative psych ROS   GI/Hepatic Neg liver ROS, GERD  Medicated and Controlled,  Endo/Other  negative endocrine ROS  Renal/GU negative Renal ROS  negative genitourinary   Musculoskeletal negative musculoskeletal ROS (+)   Abdominal   Peds negative pediatric ROS (+)  Hematology negative hematology ROS (+)   Anesthesia Other Findings   Reproductive/Obstetrics negative OB ROS                             Anesthesia Physical  Anesthesia Plan  ASA: II  Anesthesia Plan: General   Post-op Pain Management:    Induction: Intravenous  Airway Management Planned: LMA  Additional Equipment:   Intra-op Plan:   Post-operative Plan: Extubation in OR  Informed Consent: I have reviewed the patients History and Physical, chart, labs and discussed the procedure including the risks, benefits and alternatives for the proposed anesthesia with the patient or authorized representative who has indicated his/her understanding and acceptance.   Dental advisory given  Plan Discussed with: CRNA and Surgeon  Anesthesia Plan Comments:         Anesthesia Quick Evaluation

## 2015-04-21 NOTE — Discharge Instructions (Signed)
Hysteroscopy, Care After °Refer to this sheet in the next few weeks. These instructions provide you with information on caring for yourself after your procedure. Your health care provider may also give you more specific instructions. Your treatment has been planned according to current medical practices, but problems sometimes occur. Call your health care provider if you have any problems or questions after your procedure.  °WHAT TO EXPECT AFTER THE PROCEDURE °After your procedure, it is typical to have the following: °· You may have some cramping. This normally lasts for a couple days. °· You may have bleeding. This can vary from light spotting for a few days to menstrual-like bleeding for 3-7 days. °HOME CARE INSTRUCTIONS °· Rest for the first 1-2 days after the procedure. °· Only take over-the-counter or prescription medicines as directed by your health care provider. Do not take aspirin. It can increase the chances of bleeding. °· Take showers instead of baths for 2 weeks or as directed by your health care provider. °· Do not drive for 24 hours or as directed. °· Do not drink alcohol while taking pain medicine. °· Do not use tampons, douche, or have sexual intercourse for 2 weeks or until your health care provider says it is okay. °· Take your temperature twice a day for 4-5 days. Write it down each time. °· Follow your health care provider's advice about diet, exercise, and lifting. °· If you develop constipation, you may: °¨ Take a mild laxative if your health care provider approves. °¨ Add bran foods to your diet. °¨ Drink enough fluids to keep your urine clear or pale yellow. °· Try to have someone with you or available to you for the first 24-48 hours, especially if you were given a general anesthetic. °· Follow up with your health care provider as directed. °SEEK MEDICAL CARE IF: °· You feel dizzy or lightheaded. °· You feel sick to your stomach (nauseous). °· You have abnormal vaginal discharge. °· You  have a rash. °· You have pain that is not controlled with medicine. °SEEK IMMEDIATE MEDICAL CARE IF: °· You have bleeding that is heavier than a normal menstrual period. °· You have a fever. °· You have increasing cramps or pain, not controlled with medicine. °· You have new belly (abdominal) pain. °· You pass out. °· You have pain in the tops of your shoulders (shoulder strap areas). °· You have shortness of breath. °  °This information is not intended to replace advice given to you by your health care provider. Make sure you discuss any questions you have with your health care provider. °  °Document Released: 11/25/2012 Document Reviewed: 11/25/2012 °Elsevier Interactive Patient Education ©2016 [employer] °DISCHARGE INSTRUCTIONS: D&C / D&E °The following instructions have been prepared to help you care for yourself upon your return home. °  °Personal hygiene: °• Use sanitary pads for vaginal drainage, not tampons. °• Shower the day after your procedure. °• NO tub baths, pools or Jacuzzis for 2-3 weeks. °• Wipe front to back after using the bathroom. ° °Activity and limitations: °• Do NOT drive or operate any equipment for 24 hours. The effects of anesthesia are still present and drowsiness may result. °• Do NOT rest in bed all day. °• Walking is encouraged. °• Walk up and down stairs slowly. °• You may resume your normal activity in one to two days or as indicated by your physician. ° °Sexual activity: NO intercourse for at least 2 weeks after the procedure, or as indicated by   your physician. ° °Diet: Eat a light meal as desired this evening. You may resume your usual diet tomorrow. ° °Return to work: You may resume your work activities in one to two days or as indicated by your doctor. ° °What to expect after your surgery: Expect to have vaginal bleeding/discharge for 2-3 days and spotting for up to 10 days. It is not unusual to have soreness for up to 1-2 weeks. You may have a slight burning sensation when  you urinate for the first day. Mild cramps may continue for a couple of days. You may have a regular period in 2-6 weeks. ° °Call your doctor for any of the following: °• Excessive vaginal bleeding, saturating and changing one pad every hour. °• Inability to urinate 6 hours after discharge from hospital. °• Pain not relieved by pain medication. °• Fever of 100.4° F or greater. °• Unusual vaginal discharge or odor. ° ° Call for an appointment:  ° ° °Patient’s signature: ______________________ ° °Nurse’s signature ________________________ ° °Support person's signature_______________________ ° ° ° °

## 2015-04-21 NOTE — Transfer of Care (Signed)
Immediate Anesthesia Transfer of Care Note  Patient: Jennifer Sharp  Procedure(s) Performed: Procedure(s): DILATATION & CURETTAGE/HYSTEROSCOPY WITH RESECTOCOPE (N/A) DILATATION AND EVACUATION (N/A)  Patient Location: PACU  Anesthesia Type:General  Level of Consciousness: sedated  Airway & Oxygen Therapy: Patient Spontanous Breathing and Patient connected to nasal cannula oxygen  Post-op Assessment: Report given to RN and Post -op Vital signs reviewed and stable  Post vital signs: stable  Last Vitals:  Filed Vitals:   04/21/15 1253  BP: 125/92  Pulse: 73  Temp: 36.4 C  Resp: 16    Complications: No apparent anesthesia complications

## 2015-04-21 NOTE — Anesthesia Procedure Notes (Signed)
Procedure Name: LMA Insertion Date/Time: 04/21/2015 2:22 PM Performed by: Barkley Boards L Pre-anesthesia Checklist: Patient identified, Emergency Drugs available, Suction available and Patient being monitored Patient Re-evaluated:Patient Re-evaluated prior to inductionOxygen Delivery Method: Circle system utilized Preoxygenation: Pre-oxygenation with 100% oxygen Intubation Type: IV induction Ventilation: Mask ventilation without difficulty LMA: LMA inserted LMA Size: 4.0 Number of attempts: 1 Placement Confirmation: positive ETCO2 and breath sounds checked- equal and bilateral Dental Injury: Teeth and Oropharynx as per pre-operative assessment

## 2015-04-21 NOTE — Anesthesia Postprocedure Evaluation (Signed)
Anesthesia Post Note  Patient: Jennifer Sharp  Procedure(s) Performed: Procedure(s) (LRB): DILATATION & CURETTAGE/HYSTEROSCOPY WITH RESECTOCOPE (N/A) DILATATION AND EVACUATION (N/A)  Patient location during evaluation: PACU Anesthesia Type: General Level of consciousness: awake and alert Pain management: pain level controlled Vital Signs Assessment: post-procedure vital signs reviewed and stable Respiratory status: spontaneous breathing, nonlabored ventilation, respiratory function stable and patient connected to nasal cannula oxygen Cardiovascular status: blood pressure returned to baseline and stable Postop Assessment: no signs of nausea or vomiting Anesthetic complications: no    Last Vitals:  Filed Vitals:   04/21/15 1253  BP: 125/92  Pulse: 73  Temp: 36.4 C  Resp: 16    Last Pain: There were no vitals filed for this visit.               Montez Hageman

## 2015-04-24 ENCOUNTER — Encounter (HOSPITAL_COMMUNITY): Payer: Self-pay | Admitting: Obstetrics and Gynecology

## 2015-04-26 ENCOUNTER — Encounter (HOSPITAL_COMMUNITY): Payer: Self-pay | Admitting: Family Medicine

## 2015-05-06 ENCOUNTER — Encounter: Payer: Self-pay | Admitting: Family Medicine

## 2015-05-23 MED FILL — raNITIdine HCL 300 MG TABS: 300 | 90 days supply | Qty: 90 | Fill #3

## 2015-07-07 DIAGNOSIS — F902 Attention-deficit hyperactivity disorder, combined type: Secondary | ICD-10-CM | POA: Diagnosis not present

## 2015-07-07 DIAGNOSIS — Z01419 Encounter for gynecological examination (general) (routine) without abnormal findings: Secondary | ICD-10-CM | POA: Diagnosis not present

## 2015-08-29 ENCOUNTER — Other Ambulatory Visit: Payer: Self-pay | Admitting: Family Medicine

## 2015-08-30 MED FILL — raNITIdine HCL 300 MG TABS: 300 | 90 days supply | Qty: 90 | Fill #0

## 2015-08-30 NOTE — Telephone Encounter (Signed)
RF request for ranitidine LOV: 08/19/14 Next ov: None Last written: 08/19/14 #90 w/ 3RF

## 2015-09-04 DIAGNOSIS — G4733 Obstructive sleep apnea (adult) (pediatric): Secondary | ICD-10-CM | POA: Diagnosis not present

## 2015-09-13 ENCOUNTER — Telehealth: Payer: Self-pay | Admitting: Family Medicine

## 2015-09-13 ENCOUNTER — Encounter: Payer: Self-pay | Admitting: Family Medicine

## 2015-09-13 ENCOUNTER — Ambulatory Visit (INDEPENDENT_AMBULATORY_CARE_PROVIDER_SITE_OTHER): Payer: 59 | Admitting: Family Medicine

## 2015-09-13 VITALS — BP 117/83 | HR 81 | Temp 97.5°F | Resp 20 | Ht 67.0 in | Wt 164.8 lb

## 2015-09-13 DIAGNOSIS — Z1329 Encounter for screening for other suspected endocrine disorder: Secondary | ICD-10-CM | POA: Diagnosis not present

## 2015-09-13 DIAGNOSIS — C4361 Malignant melanoma of right upper limb, including shoulder: Secondary | ICD-10-CM

## 2015-09-13 DIAGNOSIS — Z8742 Personal history of other diseases of the female genital tract: Secondary | ICD-10-CM

## 2015-09-13 DIAGNOSIS — Z0001 Encounter for general adult medical examination with abnormal findings: Secondary | ICD-10-CM

## 2015-09-13 DIAGNOSIS — Z23 Encounter for immunization: Secondary | ICD-10-CM

## 2015-09-13 DIAGNOSIS — Z6825 Body mass index (BMI) 25.0-25.9, adult: Secondary | ICD-10-CM

## 2015-09-13 DIAGNOSIS — Z131 Encounter for screening for diabetes mellitus: Secondary | ICD-10-CM

## 2015-09-13 DIAGNOSIS — Z0000 Encounter for general adult medical examination without abnormal findings: Secondary | ICD-10-CM

## 2015-09-13 DIAGNOSIS — M79641 Pain in right hand: Secondary | ICD-10-CM | POA: Diagnosis not present

## 2015-09-13 DIAGNOSIS — Z85820 Personal history of malignant melanoma of skin: Secondary | ICD-10-CM

## 2015-09-13 DIAGNOSIS — Z79899 Other long term (current) drug therapy: Secondary | ICD-10-CM

## 2015-09-13 DIAGNOSIS — Z130 Encounter for screening for diseases of the blood and blood-forming organs and certain disorders involving the immune mechanism: Secondary | ICD-10-CM | POA: Diagnosis not present

## 2015-09-13 DIAGNOSIS — Z1321 Encounter for screening for nutritional disorder: Secondary | ICD-10-CM

## 2015-09-13 DIAGNOSIS — E538 Deficiency of other specified B group vitamins: Secondary | ICD-10-CM

## 2015-09-13 DIAGNOSIS — K635 Polyp of colon: Secondary | ICD-10-CM

## 2015-09-13 DIAGNOSIS — K5901 Slow transit constipation: Secondary | ICD-10-CM | POA: Diagnosis not present

## 2015-09-13 DIAGNOSIS — E785 Hyperlipidemia, unspecified: Secondary | ICD-10-CM | POA: Diagnosis not present

## 2015-09-13 DIAGNOSIS — Z13220 Encounter for screening for lipoid disorders: Secondary | ICD-10-CM

## 2015-09-13 LAB — CBC WITH DIFFERENTIAL/PLATELET
BASOS PCT: 0.7 % (ref 0.0–3.0)
Basophils Absolute: 0 10*3/uL (ref 0.0–0.1)
EOS ABS: 0.1 10*3/uL (ref 0.0–0.7)
Eosinophils Relative: 2 % (ref 0.0–5.0)
HEMATOCRIT: 44 % (ref 36.0–46.0)
Hemoglobin: 14.7 g/dL (ref 12.0–15.0)
LYMPHS ABS: 2.5 10*3/uL (ref 0.7–4.0)
LYMPHS PCT: 36.9 % (ref 12.0–46.0)
MCHC: 33.3 g/dL (ref 30.0–36.0)
MCV: 90.1 fl (ref 78.0–100.0)
Monocytes Absolute: 0.6 10*3/uL (ref 0.1–1.0)
Monocytes Relative: 8.5 % (ref 3.0–12.0)
NEUTROS ABS: 3.5 10*3/uL (ref 1.4–7.7)
Neutrophils Relative %: 51.9 % (ref 43.0–77.0)
PLATELETS: 266 10*3/uL (ref 150.0–400.0)
RBC: 4.88 Mil/uL (ref 3.87–5.11)
RDW: 17.3 % — AB (ref 11.5–15.5)
WBC: 6.7 10*3/uL (ref 4.0–10.5)

## 2015-09-13 LAB — COMPREHENSIVE METABOLIC PANEL
ALT: 15 U/L (ref 0–35)
AST: 15 U/L (ref 0–37)
Albumin: 4.4 g/dL (ref 3.5–5.2)
Alkaline Phosphatase: 59 U/L (ref 39–117)
BUN: 18 mg/dL (ref 6–23)
CALCIUM: 9.5 mg/dL (ref 8.4–10.5)
CHLORIDE: 102 meq/L (ref 96–112)
CO2: 30 meq/L (ref 19–32)
CREATININE: 0.89 mg/dL (ref 0.40–1.20)
GFR: 70.18 mL/min (ref >60.00)
Glucose, Bld: 92 mg/dL (ref 70–99)
Potassium: 4.2 mEq/L (ref 3.5–5.1)
Sodium: 139 mEq/L (ref 135–145)
Total Bilirubin: 0.7 mg/dL (ref 0.2–1.2)
Total Protein: 6.7 g/dL (ref 6.0–8.3)

## 2015-09-13 LAB — LIPID PANEL
CHOLESTEROL: 159 mg/dL (ref 0–200)
HDL: 59.8 mg/dL (ref >39.00)
LDL Cholesterol: 88 mg/dL (ref 0–99)
NonHDL: 99.16
Total CHOL/HDL Ratio: 3
Triglycerides: 56 mg/dL (ref 0.0–149.0)
VLDL: 11.2 mg/dL (ref 0.0–40.0)

## 2015-09-13 LAB — VITAMIN D 25 HYDROXY (VIT D DEFICIENCY, FRACTURES): VITD: 44.76 ng/mL (ref 30.00–100.00)

## 2015-09-13 LAB — TSH: TSH: 1.8 u[IU]/mL (ref 0.35–4.50)

## 2015-09-13 LAB — MAGNESIUM: MAGNESIUM: 2 mg/dL (ref 1.5–2.5)

## 2015-09-13 LAB — HEMOGLOBIN A1C: HEMOGLOBIN A1C: 5.7 % (ref 4.6–6.5)

## 2015-09-13 LAB — VITAMIN B12: Vitamin B-12: 191 pg/mL — ABNORMAL LOW (ref 211–911)

## 2015-09-13 MED ORDER — DICLOFENAC SODIUM 75 MG PO TBEC
75.0000 mg | DELAYED_RELEASE_TABLET | Freq: Two times a day (BID) | ORAL | 2 refills | Status: DC
Start: 2015-09-13 — End: 2016-05-31

## 2015-09-13 MED ORDER — VITAMIN B-12 1000 MCG PO TABS: ORAL_TABLET | ORAL | 0 refills | Status: DC

## 2015-09-13 MED ORDER — CYCLOBENZAPRINE HCL 5 MG PO TABS: 5.0000 mg | ORAL_TABLET | Freq: Three times a day (TID) | ORAL | 1 refills | Status: DC | PRN

## 2015-09-13 MED FILL — CYCLOBENZAPRINE 5 MG TABLET: 5 | 10 days supply | Qty: 30 | Fill #0

## 2015-09-13 MED FILL — DICLOFENAC SOD EC 75 MG TAB: 75 | 30 days supply | Qty: 60 | Fill #0

## 2015-09-13 NOTE — Progress Notes (Signed)
Patient ID: [name], female  DOB: 1960-09-30, 55 y.o.   MRN: [id number] Patient Care Team    Relationship Specialty Notifications Start End  Ma Hillock, DO PCP - General Family Medicine  09/13/15   Earnstine Regal, PA-C Physician Assistant Obstetrics and Gynecology  04/04/15    Comment: [facility]  Eldred Manges, MD Consulting Physician Obstetrics and Gynecology  04/13/15   Ma Hillock, [place] Physician Family Medicine  09/13/15 09/13/15  Milus Banister, MD Attending Physician Gastroenterology  09/13/15   Heather [place], [place]  Optometry  09/13/15     Subjective:  RIELLE SCHLAUCH is a 55 y.o.  female present for new patient establishment (new provider same office transition of care), preventive exam/CPE with complaints.  All past medical history, surgical history, allergies, family history, immunizations, medications and social history were updated in the electronic medical record today. All recent labs, ED visits and hospitalizations within the last year were reviewed.  She complains of constipation and pain in her right hand/shoulders.   Constipation: pt has had slow transit constipation for many years. She has tried Linzess, but had a side effect that she could not tolerate. She states she drinks plenty of water all day long. She does not routinely exercise. Last labs collected 1 year ago with normal TSH. Colonoscopy UTD. She denies straining. Endorses BM every 3-5 days. She does take a probiotic.   Right hand discomfort: Pt complains of right hand discomfort, with neck shoulder tightness. She is right handed. 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  She notices the discomfort especially after being on the computer or typing all day. She points to her trapezius bilaterally as location. She also endorses right hand sharp pains, weakness on occasions. She denies numbness or tingling In her fingers. She denies h/o of neck injury or surgery.  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   Health maintenance:  Colonoscopy: completed 11/28/2014, by Dr. Ardis Hughs, resutls normal. follow up 5 yr (Fhx). Personal history of polyps at 48 yr.  Mammogram: completed:08/05/2014, birads 1.  Cervical cancer screening: last pap: 07/20/2014, results: normal, completed by: Dr. Leo Grosser (GYN). Uterine polyp removed.  Immunizations: tdap administered today, Influenza UTD receives through work. (encouraged yearly Infectious disease screening: she thinks this has been completed when donating bone marrow in 1996. DEXA: Fhx osteoporosis, estrogen deficient. Long term use of nexium. Consider early screen with mammogram, pt may get through GYN. She will call if does not and needs order.  Assistive device: none Oxygen YBW:LSLH Patient has a Dental home. Hospitalizations/ED visits: Endometrial polyp removal GYN, 2017  Depression screen PHQ 2/9 09/13/2015  Decreased Interest 0  Down, Depressed, Hopeless 0  PHQ - 2 Score 0   Current Exercise Habits: Home exercise routine, Type of exercise: treadmill;walking, Time (Minutes): 45, Frequency (Times/Week): 3, Weekly Exercise (Minutes/Week): 135, Intensity: Moderate Exercise limited by: None identified  Fall Risk  09/13/2015  Falls in the past year? No     Immunization History  Administered Date(s) Administered  . Influenza Whole 11/19/2010  . Influenza-Unspecified 11/19/2014  . Tdap 09/13/2015    Past Medical History:  Diagnosis Date  . Ankle swelling    occasional   . Colon polyps 07/25/2010  . Complication of anesthesia    urinary retention  . DUB (dysfunctional uterine bleeding) 2016/2017   GYN did endometrial bx (BENIGN PATH) +  pap 04/11/15; u/s to follow  . Edema   . Endometrial polyp   . GERD (gastroesophageal reflux disease) 07/25/2010   with schatzki's ring on endo 07/2010  . History of recurrent UTIs 07/25/2010  . Melanoma ([place])  2006   on right arm  . Miscarriage   . OSA on CPAP 10/02/2011  . Premenstrual migraine    headaches since bleeding started  . Schatzki's ring   . Seasonal allergic rhinitis   . SI (sacroiliac) joint inflammation ([place])   . Tachycardia    H/O transient  tach. as a chlid   . Umbilical hernia 03/25/2351  . Unspecified constipation 07/19/2012   No Known Allergies Past Surgical History:  Procedure Laterality Date  . COLONOSCOPY  02/2010   5 yr recall ([facility] colon cancer)  . DILATATION & CURRETTAGE/HYSTEROSCOPY WITH RESECTOCOPE N/A 04/21/2015   Path: benign. Procedure: [place];  Surgeon: Eldred Manges, MD;  Location: [facility];  Service: Gynecology;  Laterality: N/A;  . DILATION AND CURETTAGE OF UTERUS  [phone]  . DILATION AND EVACUATION N/A 04/21/2015   Done for DUB/Menorrhagia.  Procedure: DILATATION AND EVACUATION;  Surgeon: Eldred Manges, MD;  Location: [facility];  Service: Gynecology;  Laterality: N/A;  . EXPLORATORY LAPAROTOMY    . HERNIA REPAIR     left inguinal  . MELANOMA EXCISION  2006   R arm  . oral surgeries  70's and 80's  . TONSILLECTOMY     childhood  . VENTRAL HERNIA REPAIR N/A 10/29/2012   Procedure: LAPAROSCOPIC VENTRAL HERNIA WITH MESH;  Surgeon: Shann Medal, MD;  Location: [facility];  Service: General;  Laterality: N/A;   Family History  Problem Relation Age of Onset  . Hyperlipidemia Mother   . Hypertension Mother   . Cancer Father 55    ish, colon- remission/ skin cancer  . Other Brother     stent placed  . Hypertension Brother   . Hyperlipidemia Brother   . Heart disease Brother     2 cardiac stents  . Other Daughter     heart ablation/ heart arrythmia  . ADD / ADHD Daughter     ADD  . Heart disease Daughter 54    tachyarrythmia, scheduled for ablation   . ADD / ADHD Son     ADD  . Cancer Paternal Uncle     colon  . Diabetes Maternal Grandmother   . Stroke Maternal Grandmother   . Hypertension Maternal  Grandmother   . Alzheimer's disease Maternal Grandmother   . Diabetes Maternal Grandfather   . Other Maternal Grandfather     cardiac disease  . Cancer Paternal Grandmother     colon  . Allergy (severe) Brother     smoker  . Other Brother     respiratory problems   Social History   Social History  . Marital status: Married    Spouse name: N/A  . Number of children: N/A  . Years of education: N/A   Occupational History  . Not on file.   Social History Main Topics  . Smoking status: Never Smoker  . Smokeless tobacco: Never Used  . Alcohol use 0.0 oz/week     Comment: socially  . Drug use: No  . Sexual activity: Yes    Partners: Male     Comment: no dietary restrictions   Other Topics Concern  . Not on file   Social History Narrative   Married. Right handed.   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    Wears seatbelt. Smoke detector in the home.      Medication List       Accurate as of 09/13/15 10:42 AM. Always use your most recent med list.          calcium carbonate 600 MG Tabs tablet Commonly known as:  OS-CAL Take 600 mg by mouth daily.   cyclobenzaprine 5 MG tablet Commonly known as:  FLEXERIL Take 1 tablet (5 mg total) by mouth 3 (three) times daily as needed for muscle spasms.   diclofenac 75 MG EC tablet Commonly known as:  VOLTAREN Take 1 tablet (75 mg total) by mouth 2 (two) times daily.   ferrous sulfate 325 (65 FE) MG tablet Take 325 mg by mouth daily with breakfast.   FISH OIL PO Take 1 capsule by mouth daily. Omega Red   ibuprofen 600 MG tablet Commonly known as:  ADVIL,MOTRIN Take 1 tablet every 6 hours for 3 days and then 1 tablet every 6 hours as needed for pain   mineral oil liquid Take 15 mLs by mouth daily as needed for mild constipation or moderate constipation.   multivitamin with minerals Tabs tablet Take 1 tablet by mouth daily.   NEXIUM 40 MG capsule Generic drug:  esomeprazole TAKE 1 CAPSULE BY MOUTH ONCE DAILY BEFORE  BREAKFAST   PROBIOTIC ADVANCED PO Take by mouth.   ranitidine 300 MG tablet Commonly known as:  ZANTAC TAKE 1 TABLET BY MOUTH AT BEDTIME      No results found for this or any previous visit (from the past 2160 hour(s)).  No results found.   ROS: 14 pt review of systems performed and negative (unless mentioned in an HPI)  Objective: BP 117/83 (BP Location: Left Arm, Patient Position: Sitting, Cuff Size: Normal)   Pulse 81   Temp 97.5 F (36.4 C) (Oral)   Resp 20   Ht _0  (1.702 m)   Wt 164 lb 12 oz (74.7 kg)   SpO2 97%   BMI 25.80 kg/m  Gen: Afebrile. No acute distress. Nontoxic in appearance, well-developed, well-nourished, pleasant, caucasian female.  HENT: AT. [place]. Bilateral TM visualized and normal in appearance, normal external auditory canal. MMM, no oral lesions, adequate dentition. Bilateral nares within normal limits. Throat without erythema, ulcerations or exudates. no Cough on exam, no hoarseness on exam. Eyes:Pupils Equal Round Reactive to light, Extraocular movements intact,  Conjunctiva without redness, discharge or icterus. Neck/lymp/endocrine: Supple,no lymphadenopathy, no thyromegaly CV: RRR no murmur, no edema, +2/4 P posterior tibialis pulses. no carotid bruits. No JVD. Chest: CTAB, no wheeze, rhonchi or crackles. normal Respiratory effort. good Air movement. Abd: Soft. flat. NTND. BS present. no Masses palpated. No hepatosplenomegaly. No rebound tenderness or guarding. Skin: no rashes, purpura or petechiae. Warm and well-perfused. Skin intact. Pinpoint hyperpigmented lesion upper sternum.  Neuro/Msk: Normal gait. PERLA. EOMi. Alert. Oriented x3.  Cranial nerves II through XII intact. Muscle strength 5/5 upper/lower extremity. palpable elevated distal clavicle (congenital), mild SCM hypertrophy right, spasm bilateral upper trap, No TTP trap, cervical spine, bicep tendon or shoulder TP. Negative tinels elbow and wrist. Negative phalens. Discomfort with resisted  flexion right index and thumb. DTRs equal bilaterally. Psych: Normal affect, dress and demeanor. Normal speech. Normal thought content and judgment.  Assessment/plan: INARA DIKE is a 55 y.o. female present for transition of care, preventive exam with multiple complaints. Encounter for general adult medical examination with abnormal findings Need for diphtheria-tetanus-pertussis (Tdap) vaccine/h/o colon polyps/BMI >25, h/o abnl  pap, hyperlipidemia Patient was encouraged to exercise greater than 150 minutes a week. Patient was encouraged to choose a diet filled with fresh fruits and vegetables, and lean meats. Colonoscopy: completed 11/28/2014, by Dr. Ardis Hughs, resutls normal. follow up 5 yr (Fhx). Personal history of polyps at 19 yr.  Mammogram: completed:08/05/2014, birads 1. Complete mammogram every 2 years.  Cervical cancer screening: last pap: 07/20/2014, results: normal, completed by: Dr. Leo Grosser (GYN). Uterine polyp removed. Continue to follow for PAPs an instructed by GYN Immunizations: tdap administered today, Influenza UTD receives through work. (encouraged yearly Infectious disease screening: she thinks this has been completed when donating bone marrow in 1996. HIV completed 1996. DEXA: Fhx osteoporosis, estrogen deficient. Long term use of nexium. Consider early screen with mammogram, pt may get through GYN. She will call if does not and needs order.  - encouraged routine visits with dentist.  - Tdap vaccine greater than or equal to 7yo IM - CBC w/Diff--> anemia screen - TSH--> constipation, BMI >25 - b12, Vit D, Magnesium-->Long term use PPI - HgB A1c--> BMI >25 - Comp Met (CMET)--> long term med use - Lipid panel-> BMI >25, cholesterol screen, hyperlipidemia history.   Slow transit constipation - uncontrolled.  - continue 80+ ounces of water a day, probiotic. Pt is on iron daily.  - Increase exercise. - consider daily Senna-D for 4 weeks, with bowel training. She is encouraged  to make bathroom time everyday same time for 30 min.   Right hand pain - new. With shoulder tightness. 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  - Scheduled NSAID for 5-7 days, with muscle relaxer, then PRN. Heat therapy. Caution on sedation of flexeril. - Consider body mechanics, may need desk/chair alteration.  - if pain worsens or does not improve will need to follow up in 4 weeks to investigate further.  - Diclofenac/flexeril prescribed  Malignant melanoma of right upper extremity including shoulder ([place]) - pt has not had dermatology follow up in about a year. She follows with [facility] for melanoma. She was encouraged to make an appt with dermatology for skin examination, and continue to follow yearly. She states she will make appt.    AVS provided to patient today for education/recommendation on gender specific health and safety maintenance.  Return in about 1 year (around 09/12/2016), or cpe, for CPE.  Electronically signed by: Howard Pouch, DO [place]

## 2015-09-13 NOTE — Telephone Encounter (Signed)
Please call pt: - all her labs are normal, with the exception of her B12 is low. I have called in a supplement for her to start. We will need to retest level in 8 weeks to ensure she is absorbing the b12, and not needing injections of b12 instead.

## 2015-09-13 NOTE — Patient Instructions (Signed)
I have called in flexeril and diclofenac for your muscle related issues.  Take both scheduled for 5-7 days and heat therapy. Try senna- D daily for about 4 weeks, with bowel training (sitting on the toilet for 30 minutes).  Health Maintenance, Female Adopting a healthy lifestyle and getting preventive care can go a long way to promote health and wellness. Talk with your health care provider about what schedule of regular examinations is right for you. This is a good chance for you to check in with your provider about disease prevention and staying healthy. In between checkups, there are plenty of things you can do on your own. Experts have done a lot of research about which lifestyle changes and preventive measures are most likely to keep you healthy. Ask your health care provider for more information. WEIGHT AND DIET  Eat a healthy diet  Be sure to include plenty of vegetables, fruits, low-fat dairy products, and lean protein.  Do not eat a lot of foods high in solid fats, added sugars, or salt.  Get regular exercise. This is one of the most important things you can do for your health.  Most adults should exercise for at least 150 minutes each week. The exercise should increase your heart rate and make you sweat (moderate-intensity exercise).  Most adults should also do strengthening exercises at least twice a week. This is in addition to the moderate-intensity exercise.  Maintain a healthy weight  Body mass index (BMI) is a measurement that can be used to identify possible weight problems. It estimates body fat based on height and weight. Your health care provider can help determine your BMI and help you achieve or maintain a healthy weight.  For females 38 years of age and older:   A BMI below 18.5 is considered underweight.  A BMI of 18.5 to 24.9 is normal.  A BMI of 25 to 29.9 is considered overweight.  A BMI of 30 and above is considered obese.  Watch levels of cholesterol  and blood lipids  You should start having your blood tested for lipids and cholesterol at 54 years of age, then have this test every 5 years.  You may need to have your cholesterol levels checked more often if:  Your lipid or cholesterol levels are high.  You are older than 54 years of age.  You are at high risk for heart disease.  CANCER SCREENING   Lung Cancer  Lung cancer screening is recommended for adults 74-23 years old who are at high risk for lung cancer because of a history of smoking.  A yearly low-dose CT scan of the lungs is recommended for people who:  Currently smoke.  Have quit within the past 15 years.  Have at least a 30-pack-year history of smoking. A pack year is smoking an average of one pack of cigarettes a day for 1 year.  Yearly screening should continue until it has been 15 years since you quit.  Yearly screening should stop if you develop a health problem that would prevent you from having lung cancer treatment.  Breast Cancer  Practice breast self-awareness. This means understanding how your breasts normally appear and feel.  It also means doing regular breast self-exams. Let your health care provider know about any changes, no matter how small.  If you are in your 20s or 30s, you should have a clinical breast exam (CBE) by a health care provider every 1-3 years as part of a regular health exam.  If  you are 40 or older, have a CBE every year. Also consider having a breast X-ray (mammogram) every year.  If you have a family history of breast cancer, talk to your health care provider about genetic screening.  If you are at high risk for breast cancer, talk to your health care provider about having an MRI and a mammogram every year.  Breast cancer gene (BRCA) assessment is recommended for women who have family members with BRCA-related cancers. BRCA-related cancers include:  Breast.  Ovarian.  Tubal.  Peritoneal cancers.  Results of the  assessment will determine the need for genetic counseling and BRCA1 and BRCA2 testing. Cervical Cancer Your health care provider may recommend that you be screened regularly for cancer of the pelvic organs (ovaries, uterus, and vagina). This screening involves a pelvic examination, including checking for microscopic changes to the surface of your cervix (Pap test). You may be encouraged to have this screening done every 3 years, beginning at age 6.  For women ages 76-65, health care providers may recommend pelvic exams and Pap testing every 3 years, or they may recommend the Pap and pelvic exam, combined with testing for human papilloma virus (HPV), every 5 years. Some types of HPV increase your risk of cervical cancer. Testing for HPV may also be done on women of any age with unclear Pap test results.  Other health care providers may not recommend any screening for nonpregnant women who are considered low risk for pelvic cancer and who do not have symptoms. Ask your health care provider if a screening pelvic exam is right for you.  If you have had past treatment for cervical cancer or a condition that could lead to cancer, you need Pap tests and screening for cancer for at least 20 years after your treatment. If Pap tests have been discontinued, your risk factors (such as having a new sexual partner) need to be reassessed to determine if screening should resume. Some women have medical problems that increase the chance of getting cervical cancer. In these cases, your health care provider may recommend more frequent screening and Pap tests. Colorectal Cancer  This type of cancer can be detected and often prevented.  Routine colorectal cancer screening usually begins at 54 years of age and continues through 54 years of age.  Your health care provider may recommend screening at an earlier age if you have risk factors for colon cancer.  Your health care provider may also recommend using home test kits  to check for hidden blood in the stool.  A small camera at the end of a tube can be used to examine your colon directly (sigmoidoscopy or colonoscopy). This is done to check for the earliest forms of colorectal cancer.  Routine screening usually begins at age 34.  Direct examination of the colon should be repeated every 5-10 years through 54 years of age. However, you may need to be screened more often if early forms of precancerous polyps or small growths are found. Skin Cancer  Check your skin from head to toe regularly.  Tell your health care provider about any new moles or changes in moles, especially if there is a change in a mole's shape or color.  Also tell your health care provider if you have a mole that is larger than the size of a pencil eraser.  Always use sunscreen. Apply sunscreen liberally and repeatedly throughout the day.  Protect yourself by wearing long sleeves, pants, a wide-brimmed hat, and sunglasses whenever you are  outside. HEART DISEASE, DIABETES, AND HIGH BLOOD PRESSURE   High blood pressure causes heart disease and increases the risk of stroke. High blood pressure is more likely to develop in:  People who have blood pressure in the high end of the normal range (130-139/85-89 mm Hg).  People who are overweight or obese.  People who are African American.  If you are 68-25 years of age, have your blood pressure checked every 3-5 years. If you are 70 years of age or older, have your blood pressure checked every year. You should have your blood pressure measured twice--once when you are at a hospital or clinic, and once when you are not at a hospital or clinic. Record the average of the two measurements. To check your blood pressure when you are not at a hospital or clinic, you can use:  An automated blood pressure machine at a pharmacy.  A home blood pressure monitor.  If you are between 81 years and 44 years old, ask your health care provider if you should  take aspirin to prevent strokes.  Have regular diabetes screenings. This involves taking a blood sample to check your fasting blood sugar level.  If you are at a normal weight and have a low risk for diabetes, have this test once every three years after 54 years of age.  If you are overweight and have a high risk for diabetes, consider being tested at a younger age or more often. PREVENTING INFECTION  Hepatitis B  If you have a higher risk for hepatitis B, you should be screened for this virus. You are considered at high risk for hepatitis B if:  You were born in a country where hepatitis B is common. Ask your health care provider which countries are considered high risk.  Your parents were born in a high-risk country, and you have not been immunized against hepatitis B (hepatitis B vaccine).  You have HIV or AIDS.  You use needles to inject street drugs.  You live with someone who has hepatitis B.  You have had sex with someone who has hepatitis B.  You get hemodialysis treatment.  You take certain medicines for conditions, including cancer, organ transplantation, and autoimmune conditions. Hepatitis C  Blood testing is recommended for:  Everyone born from [birth year] through 1964.  Anyone with known risk factors for hepatitis C. Sexually transmitted infections (STIs)  You should be screened for sexually transmitted infections (STIs) including gonorrhea and chlamydia if:  You are sexually active and are younger than 54 years of age.  You are older than 54 years of age and your health care provider tells you that you are at risk for this type of infection.  Your sexual activity has changed since you were last screened and you are at an increased risk for chlamydia or gonorrhea. Ask your health care provider if you are at risk.  If you do not have HIV, but are at risk, it may be recommended that you take a prescription medicine daily to prevent HIV infection. This is called  pre-exposure prophylaxis (PrEP). You are considered at risk if:  You are sexually active and do not regularly use condoms or know the HIV status of your partner(s).  You take drugs by injection.  You are sexually active with a partner who has HIV. Talk with your health care provider about whether you are at high risk of being infected with HIV. If you choose to begin PrEP, you should first be tested for HIV.  You should then be tested every 3 months for as long as you are taking PrEP.  PREGNANCY   If you are premenopausal and you may become pregnant, ask your health care provider about preconception counseling.  If you may become pregnant, take 400 to 800 micrograms (mcg) of folic acid every day.  If you want to prevent pregnancy, talk to your health care provider about birth control (contraception). OSTEOPOROSIS AND MENOPAUSE   Osteoporosis is a disease in which the bones lose minerals and strength with aging. This can result in serious bone fractures. Your risk for osteoporosis can be identified using a bone density scan.  If you are 52 years of age or older, or if you are at risk for osteoporosis and fractures, ask your health care provider if you should be screened.  Ask your health care provider whether you should take a calcium or vitamin D supplement to lower your risk for osteoporosis.  Menopause may have certain physical symptoms and risks.  Hormone replacement therapy may reduce some of these symptoms and risks. Talk to your health care provider about whether hormone replacement therapy is right for you.  HOME CARE INSTRUCTIONS   Schedule regular health, dental, and eye exams.  Stay current with your immunizations.   Do not use any tobacco products including cigarettes, chewing tobacco, or electronic cigarettes.  If you are pregnant, do not drink alcohol.  If you are breastfeeding, limit how much and how often you drink alcohol.  Limit alcohol intake to no more than 1  drink per day for nonpregnant women. One drink equals 12 ounces of beer, 5 ounces of wine, or 1 ounces of hard liquor.  Do not use street drugs.  Do not share needles.  Ask your health care provider for help if you need support or information about quitting drugs.  Tell your health care provider if you often feel depressed.  Tell your health care provider if you have ever been abused or do not feel safe at home.   This information is not intended to replace advice given to you by your health care provider. Make sure you discuss any questions you have with your health care provider.   Document Released: 08/20/2010 Document Revised: 02/25/2014 Document Reviewed: 01/06/2013 Elsevier Interactive Patient Education [employer].

## 2015-09-14 NOTE — Telephone Encounter (Signed)
Spoke with patient reviewed results and instructions with patient. Patient verbalized understanding. 

## 2015-09-15 DIAGNOSIS — G4733 Obstructive sleep apnea (adult) (pediatric): Secondary | ICD-10-CM | POA: Diagnosis not present

## 2015-09-18 DIAGNOSIS — G4733 Obstructive sleep apnea (adult) (pediatric): Secondary | ICD-10-CM | POA: Diagnosis not present

## 2015-10-20 DIAGNOSIS — Z8742 Personal history of other diseases of the female genital tract: Secondary | ICD-10-CM | POA: Diagnosis not present

## 2015-10-20 DIAGNOSIS — Z01419 Encounter for gynecological examination (general) (routine) without abnormal findings: Secondary | ICD-10-CM | POA: Diagnosis not present

## 2015-10-20 DIAGNOSIS — Z6827 Body mass index (BMI) 27.0-27.9, adult: Secondary | ICD-10-CM | POA: Diagnosis not present

## 2015-10-20 DIAGNOSIS — N898 Other specified noninflammatory disorders of vagina: Secondary | ICD-10-CM | POA: Diagnosis not present

## 2015-10-20 DIAGNOSIS — Z6831 Body mass index (BMI) 31.0-31.9, adult: Secondary | ICD-10-CM | POA: Diagnosis not present

## 2015-10-20 DIAGNOSIS — Z1231 Encounter for screening mammogram for malignant neoplasm of breast: Secondary | ICD-10-CM | POA: Diagnosis not present

## 2015-10-24 LAB — HM MAMMOGRAPHY

## 2015-11-27 ENCOUNTER — Other Ambulatory Visit (INDEPENDENT_AMBULATORY_CARE_PROVIDER_SITE_OTHER): Payer: 59

## 2015-11-27 ENCOUNTER — Telehealth: Payer: Self-pay | Admitting: *Deleted

## 2015-11-27 DIAGNOSIS — H2513 Age-related nuclear cataract, bilateral: Secondary | ICD-10-CM | POA: Diagnosis not present

## 2015-11-27 DIAGNOSIS — E538 Deficiency of other specified B group vitamins: Secondary | ICD-10-CM

## 2015-11-27 DIAGNOSIS — Z1159 Encounter for screening for other viral diseases: Secondary | ICD-10-CM

## 2015-11-27 DIAGNOSIS — H5213 Myopia, bilateral: Secondary | ICD-10-CM | POA: Diagnosis not present

## 2015-11-27 DIAGNOSIS — H11441 Conjunctival cysts, right eye: Secondary | ICD-10-CM | POA: Diagnosis not present

## 2015-11-27 DIAGNOSIS — H52223 Regular astigmatism, bilateral: Secondary | ICD-10-CM | POA: Diagnosis not present

## 2015-11-27 DIAGNOSIS — H524 Presbyopia: Secondary | ICD-10-CM | POA: Diagnosis not present

## 2015-11-27 NOTE — Telephone Encounter (Signed)
Patient called and requested to be screened for Hep C. Ok per Dr Raoul Pitch order placed.

## 2015-11-28 ENCOUNTER — Telehealth: Payer: Self-pay | Admitting: Family Medicine

## 2015-11-28 LAB — HEPATITIS C ANTIBODY: HCV Ab: NEGATIVE

## 2015-11-28 LAB — VITAMIN B12: Vitamin B-12: 819 pg/mL (ref 200–1100)

## 2015-11-28 NOTE — Telephone Encounter (Signed)
Please call pt: - her hep c is negative. - her b12 looks great, she should continue a daily OTC supplement (any mcg/mg is fine)  Results for orders placed or performed in visit on 11/27/15 (from the past 24 hour(s))  Vitamin B12     Status: None   Collection Time: 11/27/15  4:18 PM  Result Value Ref Range   Vitamin B-12 819 200 - 1,100 pg/mL   Narrative   Performed at:  [facility], [id number]                [place], [place] [address]  Hepatitis C Antibody     Status: None   Collection Time: 11/27/15  4:18 PM  Result Value Ref Range   HCV Ab NEGATIVE NEGATIVE   Narrative   Performed at:  [id number]                [place], [place] [address]

## 2015-11-28 NOTE — Telephone Encounter (Signed)
Spoke with patient reviewed lab results and instructions patient verbalized understanding of all  Instructions.

## 2015-12-01 ENCOUNTER — Other Ambulatory Visit: Payer: Self-pay | Admitting: *Deleted

## 2015-12-01 MED ORDER — RANITIDINE HCL 300 MG PO TABS: 300.0000 mg | ORAL_TABLET | Freq: Every day | ORAL | 2 refills | Status: DC

## 2015-12-01 MED FILL — raNITIdine HCL 300 MG TABS: 300 | 90 days supply | Qty: 90 | Fill #0

## 2015-12-13 DIAGNOSIS — Z872 Personal history of diseases of the skin and subcutaneous tissue: Secondary | ICD-10-CM | POA: Diagnosis not present

## 2015-12-13 DIAGNOSIS — D2272 Melanocytic nevi of left lower limb, including hip: Secondary | ICD-10-CM | POA: Diagnosis not present

## 2015-12-13 DIAGNOSIS — D2260 Melanocytic nevi of unspecified upper limb, including shoulder: Secondary | ICD-10-CM | POA: Diagnosis not present

## 2015-12-13 DIAGNOSIS — D225 Melanocytic nevi of trunk: Secondary | ICD-10-CM | POA: Diagnosis not present

## 2015-12-13 DIAGNOSIS — D1801 Hemangioma of skin and subcutaneous tissue: Secondary | ICD-10-CM | POA: Diagnosis not present

## 2015-12-13 DIAGNOSIS — D485 Neoplasm of uncertain behavior of skin: Secondary | ICD-10-CM | POA: Diagnosis not present

## 2015-12-13 DIAGNOSIS — D2270 Melanocytic nevi of unspecified lower limb, including hip: Secondary | ICD-10-CM | POA: Diagnosis not present

## 2015-12-13 DIAGNOSIS — L814 Other melanin hyperpigmentation: Secondary | ICD-10-CM | POA: Diagnosis not present

## 2015-12-13 DIAGNOSIS — L821 Other seborrheic keratosis: Secondary | ICD-10-CM | POA: Diagnosis not present

## 2015-12-13 DIAGNOSIS — Z85820 Personal history of malignant melanoma of skin: Secondary | ICD-10-CM | POA: Diagnosis not present

## 2016-02-17 DIAGNOSIS — G4733 Obstructive sleep apnea (adult) (pediatric): Secondary | ICD-10-CM | POA: Diagnosis not present

## 2016-03-25 MED FILL — raNITIdine HCL 300 MG TABS: 300 | 90 days supply | Qty: 90 | Fill #1

## 2016-03-29 ENCOUNTER — Encounter: Payer: Self-pay | Admitting: Family Medicine

## 2016-03-29 ENCOUNTER — Telehealth: Payer: Self-pay | Admitting: Family Medicine

## 2016-03-29 ENCOUNTER — Telehealth: Payer: 59 | Admitting: Family

## 2016-03-29 DIAGNOSIS — B9689 Other specified bacterial agents as the cause of diseases classified elsewhere: Secondary | ICD-10-CM

## 2016-03-29 DIAGNOSIS — J329 Chronic sinusitis, unspecified: Secondary | ICD-10-CM | POA: Diagnosis not present

## 2016-03-29 MED ORDER — AMOXICILLIN-POT CLAVULANATE 875-125 MG PO TABS: 1.0000 | ORAL_TABLET | Freq: Two times a day (BID) | ORAL | 0 refills | Status: AC

## 2016-03-29 MED ORDER — BENZONATATE 100 MG PO CAPS: 100.0000 mg | ORAL_CAPSULE | Freq: Three times a day (TID) | ORAL | 0 refills | Status: DC | PRN

## 2016-03-29 MED FILL — BENZONATATE 100 MG CAP: 100 | 5 days supply | Qty: 30 | Fill #0

## 2016-03-29 MED FILL — AMOX TR-K CLV 875-125 MG TA: 875-125 | 7 days supply | Qty: 14 | Fill #0

## 2016-03-29 NOTE — Telephone Encounter (Signed)
Tried to call patient back left message for patient letting her know she can try to get appt for Saturday clinic or try a video visit or Urgent Care. No appts available with Dr Raoul Pitch  Today.

## 2016-03-29 NOTE — Progress Notes (Signed)
We are sorry that you are not feeling well.  Here is how we plan to help!  Based on what you have shared with me it looks like you have sinusitis.  Sinusitis is inflammation and infection in the sinus cavities of the head.  Based on your presentation I believe you most likely have Acute Bacterial Sinusitis.  This is an infection caused by bacteria and is treated with antibiotics. I have prescribed Augmentin 875mg /125mg  one tablet twice daily with food, for 7 days. You may use an oral decongestant such as Mucinex D or if you have glaucoma or high blood pressure use plain Mucinex. Saline nasal spray help and can safely be used as often as needed for congestion.  If you develop worsening sinus pain, fever or notice severe headache and vision changes, or if symptoms are not better after completion of antibiotic, please schedule an appointment with a health care provider.    As you indicated you have a cough, I have also prescribed Tessalon Perles 100mg , take 1-2 capsules every 8h as needed for cough.   Sinus infections are not as easily transmitted as other respiratory infection, however we still recommend that you avoid close contact with loved ones, especially the very young and elderly.  Remember to wash your hands thoroughly throughout the day as this is the number one way to prevent the spread of infection!  Home Care:  Only take medications as instructed by your medical team.  Complete the entire course of an antibiotic.  Do not take these medications with alcohol.  A steam or ultrasonic humidifier can help congestion.  You can place a towel over your head and breathe in the steam from hot water coming from a faucet.  Avoid close contacts especially the very young and the elderly.  Cover your mouth when you cough or sneeze.  Always remember to wash your hands.  Get Help Right Away If:  You develop worsening fever or sinus pain.  You develop a severe head ache or visual changes.  Your  symptoms persist after you have completed your treatment plan.  Make sure you  Understand these instructions.  Will watch your condition.  Will get help right away if you are not doing well or get worse.  Your e-visit answers were reviewed by a board certified advanced clinical practitioner to complete your personal care plan.  Depending on the condition, your plan could have included both over the counter or prescription medications.  If there is a problem please reply  once you have received a response from your provider.  Your safety is important to [place].  If you have drug allergies check your prescription carefully.    You can use MyChart to ask questions about today's visit, request a non-urgent call back, or ask for a work or school excuse for 24 hours related to this e-Visit. If it has been greater than 24 hours you will need to follow up with your provider, or enter a new e-Visit to address those concerns.  You will get an e-mail in the next two days asking about your experience.  I hope that your e-visit has been valuable and will speed your recovery. Thank you for using e-visits.

## 2016-03-29 NOTE — Telephone Encounter (Signed)
Patient sent mychart message to [facility] and is concerned that she needs to get seen today if possible but wants to speak to Hart or DO first. Please call patient back concerning this.   Thank you.

## 2016-05-03 ENCOUNTER — Encounter: Payer: 59 | Admitting: Family Medicine

## 2016-05-29 ENCOUNTER — Ambulatory Visit (INDEPENDENT_AMBULATORY_CARE_PROVIDER_SITE_OTHER): Payer: 59 | Admitting: Pulmonary Disease

## 2016-05-29 ENCOUNTER — Encounter: Payer: Self-pay | Admitting: Pulmonary Disease

## 2016-05-29 VITALS — BP 116/70 | HR 78 | Ht 67.0 in | Wt 164.6 lb

## 2016-05-29 DIAGNOSIS — G4733 Obstructive sleep apnea (adult) (pediatric): Secondary | ICD-10-CM | POA: Diagnosis not present

## 2016-05-29 NOTE — Patient Instructions (Signed)
Will arrange for referral to Dr. Mark Katz to assess for oral appliance to treat obstructive sleep apnea  Follow up in 6 months 

## 2016-05-29 NOTE — Progress Notes (Signed)
Past Surgical History She  has a past surgical history that includes oral surgeries (70's and 80's); Dilation and curettage of uterus (64,78,[age]); Melanoma excision (2006); Tonsillectomy; Hernia repair; Exploratory laparotomy; Ventral hernia repair (N/A, 10/29/2012); Colonoscopy (02/2010); Dilatation & currettage/hysteroscopy with resectoscope (N/A, 04/21/2015); and Dilation and evacuation (N/A, 04/21/2015).  No Known Allergies  Family History Her family history includes ADD / ADHD in her daughter and son; Allergy (severe) in her brother; Alzheimer's disease in her maternal grandmother; Cancer in her paternal grandmother and paternal uncle; Cancer (age of onset: 73) in her father; Diabetes in her maternal grandfather and maternal grandmother; Heart disease in her brother; Heart disease (age of onset: 21) in her daughter; Hyperlipidemia in her brother and mother; Hypertension in her brother, maternal grandmother, and mother; Other in her brother, brother, daughter, and maternal grandfather; Stroke in her maternal grandmother.  Social History She  reports that she has never smoked. She has never used smokeless tobacco. She reports that she drinks alcohol. She reports that she does not use drugs.  Review of systems Positive: Reflux, indigestion, sore throat.  Remainder of 12 point ROS negative  Current Outpatient Prescriptions on File Prior to Visit  Medication Sig  . calcium carbonate (OS-CAL) 600 MG TABS Take 600 mg by mouth daily.   . diclofenac (VOLTAREN) 75 MG EC tablet Take 1 tablet (75 mg total) by mouth 2 (two) times daily.  . ferrous sulfate 325 (65 FE) MG tablet Take 325 mg by mouth daily with breakfast.  . ibuprofen (ADVIL,MOTRIN) 600 MG tablet Take 1 tablet every 6 hours for 3 days and then 1 tablet every 6 hours as needed for pain  . Multiple Vitamin (MULTIVITAMIN WITH MINERALS) TABS tablet Take 1 tablet by mouth daily.  Marland Kitchen NEXIUM 40 MG capsule TAKE 1 CAPSULE BY MOUTH ONCE DAILY BEFORE  BREAKFAST (Patient taking differently: Take 40 mg by mouth daily as needed. TAKE 1 CAPSULE BY MOUTH ONCE DAILY BEFORE BREAKFAST)  . Omega-3 Fatty Acids (FISH OIL PO) Take 1 capsule by mouth daily. Omega Red  . Probiotic Product (PROBIOTIC ADVANCED PO) Take by mouth.  . ranitidine (ZANTAC) 300 MG tablet Take 1 tablet (300 mg total) by mouth at bedtime.  . vitamin B-12 (CYANOCOBALAMIN) 1000 MCG tablet 1000 mcg daily for 7 days and then 1000 mcg weekly   No current facility-administered medications on file prior to visit.     Chief Complaint  Patient presents with  . Sleep Consult    needs a sleep doctor for CPAP use, been on CPAP for 5 years, due for another sleep study to continue CPAP use    Sleep tests HST 06/24/11 >> AHI 19.7, SaO2 low 75% CPAP 02/29/16 to 05/28/16 >> used on 88 of 90 nights with average 5 hrs 29 min.  Average AHI 0.8 with CPAP 8 cm H2O  Cardiac tests Echo 08/07/10 >> EF 55%  Past medical history She  has a past medical history of Ankle swelling; Colon polyps (07/25/2010); Complication of anesthesia; DUB (dysfunctional uterine bleeding) (2016/2017); Edema; Endometrial polyp; GERD (gastroesophageal reflux disease) (07/25/2010); History of recurrent UTIs (07/25/2010); Melanoma ([place]) (2006); Miscarriage; OSA on CPAP (10/02/2011); Premenstrual migraine; Schatzki's ring; Seasonal allergic rhinitis; SI (sacroiliac) joint inflammation ([place]); Tachycardia; Umbilical hernia (0/03/7739); and Unspecified constipation (07/19/2012).  Vital signs BP 116/70 (BP Location: Right Arm, Cuff Size: Normal)   Pulse 78   Ht 5\' 7"  (1.702 m)   Wt 164 lb 9.6 oz (74.7 kg)   SpO2 99%   BMI  25.78 kg/m   History of Present Illness Jennifer Sharp is a 56 y.o. female for evaluation of sleep problems.  She had sleep study in 2013 and found to have moderate sleep apnea.  She has been on CPAP ever since.  She uses nightly.  She never had other options discussed.  She was having snoring and apnea before  starting CPAP.  Her mother had sudden cardiac death at age 35.    She goes to sleep at 11 pm.  She falls asleep after 10 minutes.  She wakes up some times to use the bathroom.  She gets out of bed at 6 am on weekdays, and 8 am on weekends.  She feels rested in the morning.  She denies morning headache.  She does feel stressed from work, and sometimes takes melatonin at night.  She drinks one cup of coffee in the morning.  She denies sleep walking, sleep talking, bruxism, or nightmares.  There is no history of restless legs.  She denies sleep hallucinations, sleep paralysis, or cataplexy.  The Epworth score is 3 out of 24.   Physical Exam:  General - No distress ENT - No sinus tenderness, no oral exudate, no LAN, no thyromegaly, TM clear, pupils equal/reactive, MP 3, low laying soft palate Cardiac - s1s2 regular, no murmur, pulses symmetric Chest - No wheeze/rales/dullness, good air entry, normal respiratory excursion Back - No focal tenderness Abd - Soft, non-tender, no organomegaly, + bowel sounds Ext - No edema Neuro - Normal strength, cranial nerves intact Skin - No rashes Psych - Normal mood, and behavior  Discussion: She has history of moderate sleep apnea.  She has been compliant with CPAP therapy, but would like to pursue alternative therapy if possible.  We discussed how sleep apnea can affect various health problems, including risks for hypertension, cardiovascular disease, and diabetes.  We also discussed how sleep disruption can increase risks for accidents, such as while driving.  Weight loss as a means of improving sleep apnea was also reviewed.  Additional treatment options discussed were CPAP therapy, oral appliance, and surgical intervention.  Assessment/plan:  Obstructive sleep apnea. - will arrange for referral to Dr. Oneal Grout to assess for oral appliance  - explained that she will need follow up sleep study after being fitted for oral appliance - she can continue  CPAP for now - if she is not a candidate for an oral appliance, will then arrange for new CPAP machine   Patient Instructions  Will arrange for referral to Dr. Oneal Grout to assess for oral appliance to treat obstructive sleep apnea  Follow up in 6 months    Chesley Mires, M.D. Pager [phone] 05/29/2016, 12:59 PM

## 2016-05-30 ENCOUNTER — Encounter: Payer: 59 | Admitting: Family Medicine

## 2016-05-31 ENCOUNTER — Encounter: Payer: Self-pay | Admitting: Family Medicine

## 2016-05-31 ENCOUNTER — Ambulatory Visit (INDEPENDENT_AMBULATORY_CARE_PROVIDER_SITE_OTHER): Payer: 59 | Admitting: Family Medicine

## 2016-05-31 VITALS — BP 112/75 | HR 80 | Temp 97.3°F | Resp 20 | Ht 67.0 in | Wt 166.5 lb

## 2016-05-31 DIAGNOSIS — M19011 Primary osteoarthritis, right shoulder: Secondary | ICD-10-CM

## 2016-05-31 DIAGNOSIS — Z79899 Other long term (current) drug therapy: Secondary | ICD-10-CM

## 2016-05-31 DIAGNOSIS — Z6826 Body mass index (BMI) 26.0-26.9, adult: Secondary | ICD-10-CM | POA: Diagnosis not present

## 2016-05-31 DIAGNOSIS — R5383 Other fatigue: Secondary | ICD-10-CM

## 2016-05-31 DIAGNOSIS — Z862 Personal history of diseases of the blood and blood-forming organs and certain disorders involving the immune mechanism: Secondary | ICD-10-CM

## 2016-05-31 DIAGNOSIS — Z0001 Encounter for general adult medical examination with abnormal findings: Secondary | ICD-10-CM

## 2016-05-31 DIAGNOSIS — E7849 Other hyperlipidemia: Secondary | ICD-10-CM

## 2016-05-31 DIAGNOSIS — Z9189 Other specified personal risk factors, not elsewhere classified: Secondary | ICD-10-CM | POA: Diagnosis not present

## 2016-05-31 DIAGNOSIS — G4733 Obstructive sleep apnea (adult) (pediatric): Secondary | ICD-10-CM

## 2016-05-31 DIAGNOSIS — Z131 Encounter for screening for diabetes mellitus: Secondary | ICD-10-CM

## 2016-05-31 DIAGNOSIS — E2839 Other primary ovarian failure: Secondary | ICD-10-CM

## 2016-05-31 DIAGNOSIS — Z9989 Dependence on other enabling machines and devices: Secondary | ICD-10-CM

## 2016-05-31 DIAGNOSIS — E784 Other hyperlipidemia: Secondary | ICD-10-CM | POA: Diagnosis not present

## 2016-05-31 DIAGNOSIS — K5901 Slow transit constipation: Secondary | ICD-10-CM

## 2016-05-31 HISTORY — DX: Other primary ovarian failure: E28.39

## 2016-05-31 LAB — TSH: TSH: 1.67 u[IU]/mL (ref 0.35–4.50)

## 2016-05-31 LAB — COMPREHENSIVE METABOLIC PANEL
ALK PHOS: 56 U/L (ref 39–117)
ALT: 27 U/L (ref 0–35)
AST: 20 U/L (ref 0–37)
Albumin: 4.5 g/dL (ref 3.5–5.2)
BILIRUBIN TOTAL: 1 mg/dL (ref 0.2–1.2)
BUN: 19 mg/dL (ref 6–23)
CALCIUM: 9.8 mg/dL (ref 8.4–10.5)
CO2: 32 meq/L (ref 19–32)
Chloride: 104 mEq/L (ref 96–112)
Creatinine, Ser: 0.84 mg/dL (ref 0.40–1.20)
GFR: 74.83 mL/min (ref >60.00)
Glucose, Bld: 95 mg/dL (ref 70–99)
Potassium: 4.2 mEq/L (ref 3.5–5.1)
Sodium: 140 mEq/L (ref 135–145)
Total Protein: 6.7 g/dL (ref 6.0–8.3)

## 2016-05-31 LAB — CBC WITH DIFFERENTIAL/PLATELET
BASOS ABS: 0.1 10*3/uL (ref 0.0–0.1)
BASOS PCT: 0.9 % (ref 0.0–3.0)
Eosinophils Absolute: 0.2 10*3/uL (ref 0.0–0.7)
Eosinophils Relative: 2.3 % (ref 0.0–5.0)
HEMATOCRIT: 44.3 % (ref 36.0–46.0)
Hemoglobin: 14.6 g/dL (ref 12.0–15.0)
LYMPHS PCT: 35.3 % (ref 12.0–46.0)
Lymphs Abs: 2.4 10*3/uL (ref 0.7–4.0)
MCHC: 32.9 g/dL (ref 30.0–36.0)
MCV: 95 fl (ref 78.0–100.0)
MONOS PCT: 7.6 % (ref 3.0–12.0)
Monocytes Absolute: 0.5 10*3/uL (ref 0.1–1.0)
NEUTROS ABS: 3.6 10*3/uL (ref 1.4–7.7)
Neutrophils Relative %: 53.9 % (ref 43.0–77.0)
PLATELETS: 272 10*3/uL (ref 150.0–400.0)
RBC: 4.67 Mil/uL (ref 3.87–5.11)
RDW: 13.6 % (ref 11.5–15.5)
WBC: 6.7 10*3/uL (ref 4.0–10.5)

## 2016-05-31 LAB — LIPID PANEL
CHOL/HDL RATIO: 3
CHOLESTEROL: 170 mg/dL (ref 0–200)
HDL: 64 mg/dL (ref >39.00)
LDL Cholesterol: 94 mg/dL (ref 0–99)
NonHDL: 106.42
TRIGLYCERIDES: 62 mg/dL (ref 0.0–149.0)
VLDL: 12.4 mg/dL (ref 0.0–40.0)

## 2016-05-31 LAB — HEMOGLOBIN A1C: Hgb A1c MFr Bld: 5.9 % (ref 4.6–6.5)

## 2016-05-31 MED ORDER — DICLOFENAC SODIUM 75 MG PO TBEC: 75.0000 mg | DELAYED_RELEASE_TABLET | Freq: Two times a day (BID) | ORAL | 2 refills | Status: DC

## 2016-05-31 NOTE — Patient Instructions (Addendum)
Please help [place] help you:  We are honored you have chosen [facility] for your Primary Care home. Below you will find basic instructions that you may need to access in the future. Please help [place] help you by reading the instructions, which cover many of the frequent questions we experience.   Prescription refills and request:  -In order to allow more efficient response time, please call your pharmacy for all refills. They will forward the request electronically to [place]. This allows for the quickest possible response. Request left on a nurse line can take longer to refill, since these are checked as time allows between office patients and other phone calls.  - refill request can take up to 3-5 working days to complete.  - If request is sent electronically and request is appropiate, it is usually completed in 1-2 business days.  - all patients will need to be seen routinely for all chronic medical conditions requiring prescription medications (see follow-up below). If you are overdue for follow up on your condition, you will be asked to make an appointment and we will call in enough medication to cover you until your appointment (up to 30 days).  - all controlled substances will require a face to face visit to request/refill.  - if you desire your prescriptions to go through a new pharmacy, and have an active script at original pharmacy, you will need to call your pharmacy and have scripts transferred to new pharmacy. This is completed between the pharmacy locations and not by your provider.    Results: If any images or labs were ordered, it can take up to 1 week to get results depending on the test ordered and the lab/facility running and resulting the test. - Normal or stable results, which do not need further discussion, will be released to your mychart immediately with attached note to you. A call will not be generated for normal results. Please make certain to sign up for mychart. If you have  questions on how to activate your mychart you can call the front office.  - If your results need further discussion, our office will attempt to contact you via phone, and if unable to reach you after 2 attempts, we will release your abnormal result to your mychart with instructions.  - All results will be automatically released in mychart after 1 week.  - Your provider will provide you with explanation and instruction on all relevant material in your results. Please keep in mind, results and labs may appear confusing or abnormal to the untrained eye, but it does not mean they are actually abnormal for you personally. If you have any questions about your results that are not covered, or you desire more detailed explanation than what was provided, you should make an appointment with your provider to do so.   Our office handles many outgoing and incoming calls daily. If we have not contacted you within 1 week about your results, please check your mychart to see if there is a message first and if not, then contact our office.  In helping with this matter, you help decrease call volume, and therefore allow [place] to be able to respond to patients needs more efficiently.   Acute office visits (sick visit):  An acute visit is intended for a new problem and are scheduled in shorter time slots to allow schedule openings for patients with new problems. This is the appropriate visit to discuss a new problem. In order to provide you with  excellent quality medical care with proper time for you to explain your problem, have an exam and receive treatment with instructions, these appointments should be limited to one new problem per visit. If you experience a new problem, in which you desire to be addressed, please make an acute office visit, we save openings on the schedule to accommodate you. Please do not save your new problem for any other type of visit, let us take care of it properly and quickly for you.   Follow up  visits:  Depending on your condition(s) your provider will need to see you routinely in order to provide you with quality care and prescribe medication(s). Most chronic conditions (Example: hypertension, Diabetes, depression/anxiety... etc), require visits a couple times a year. Your provider will instruct you on proper follow up for your personal medical conditions and history. Please make certain to make follow up appointments for your condition as instructed. Failing to do so could result in lapse in your medication treatment/refills. If you request a refill, and are overdue to be seen on a condition, we will always provide you with a 30 day script (once) to allow you time to schedule.    Medicare wellness (well visit): - we have a wonderful Nurse Maudie Mercury), that will meet with you and provide you will yearly medicare wellness visits. These visits should occur yearly (can not be scheduled less than 1 calendar year apart) and cover preventive health, immunizations, advance directives and screenings you are entitled to yearly through your medicare benefits. Do not miss out on your entitled benefits, this is when medicare will pay for these benefits to be ordered for you.  These are strongly encouraged by your provider and is the appropriate type of visit to make certain you are up to date with all preventive health benefits. If you have not had your medicare wellness exam in the last 12 months, please make certain to schedule one by calling the office and schedule your medicare wellness with Maudie Mercury as soon as possible.   Yearly physical (well visit):  - Adults are recommended to be seen yearly for physicals. Check with your insurance and date of your last physical, most insurances require one calendar year between physicals. Physicals include all preventive health topics, screenings, medical exam and labs that are appropriate for gender/age and history. You may have fasting labs needed at this visit. This is a  well visit (not a sick visit), acute topics should not be covered during this visit.  - Pediatric patients are seen more frequently when they are younger. Your provider will advise you on well child visit timing that is appropriate for your their age. - This is not a medicare wellness visit. Medicare wellness exams do not have an exam portion to the visit. Some medicare companies allow for a physical, some do not allow a yearly physical. If your medicare allows a yearly physical you can schedule the medicare wellness with our nurse Maudie Mercury and have your physical with your provider after, on the same day. Please check with insurance for your full benefits.   Late Policy/No Shows:  - all new patients should arrive 15-30 minutes earlier than appointment to allow [place] time  to  obtain all personal demographics,  insurance information and for you to complete office paperwork. - All established patients should arrive 10-15 minutes earlier than appointment time to update all information and be checked in .  - In our best efforts to run on time, if you are late  for your appointment you will be asked to either reschedule or if able, we will work you back into the schedule. There will be a wait time to work you back in the schedule,  depending on availability.  - If you are unable to make it to your appointment as scheduled, please call 24 hours ahead of time to allow [place] to fill the time slot with someone else who needs to be seen. If you do not cancel your appointment ahead of time, you may be charged a no show fee.   Health Maintenance, Female Adopting a healthy lifestyle and getting preventive care can go a long way to promote health and wellness. Talk with your health care provider about what schedule of regular examinations is right for you. This is a good chance for you to check in with your provider about disease prevention and staying healthy. In between checkups, there are plenty of things you can do on your  own. Experts have done a lot of research about which lifestyle changes and preventive measures are most likely to keep you healthy. Ask your health care provider for more information. Weight and diet Eat a healthy diet  Be sure to include plenty of vegetables, fruits, low-fat dairy products, and lean protein.  Do not eat a lot of foods high in solid fats, added sugars, or salt.  Get regular exercise. This is one of the most important things you can do for your health.  Most adults should exercise for at least 150 minutes each week. The exercise should increase your heart rate and make you sweat (moderate-intensity exercise).  Most adults should also do strengthening exercises at least twice a week. This is in addition to the moderate-intensity exercise. Maintain a healthy weight  Body mass index (BMI) is a measurement that can be used to identify possible weight problems. It estimates body fat based on height and weight. Your health care provider can help determine your BMI and help you achieve or maintain a healthy weight.  For females 71 years of age and older:  A BMI below 18.5 is considered underweight.  A BMI of 18.5 to 24.9 is normal.  A BMI of 25 to 29.9 is considered overweight.  A BMI of 30 and above is considered obese. Watch levels of cholesterol and blood lipids  You should start having your blood tested for lipids and cholesterol at 55 years of age, then have this test every 5 years.  You may need to have your cholesterol levels checked more often if:  Your lipid or cholesterol levels are high.  You are older than 55 years of age.  You are at high risk for heart disease. Cancer screening Lung Cancer  Lung cancer screening is recommended for adults 35-8 years old who are at high risk for lung cancer because of a history of smoking.  A yearly low-dose CT scan of the lungs is recommended for people who:  Currently smoke.  Have quit within the past 15  years.  Have at least a 30-pack-year history of smoking. A pack year is smoking an average of one pack of cigarettes a day for 1 year.  Yearly screening should continue until it has been 15 years since you quit.  Yearly screening should stop if you develop a health problem that would prevent you from having lung cancer treatment. Breast Cancer  Practice breast self-awareness. This means understanding how your breasts normally appear and feel.  It also means doing regular breast self-exams.  Let your health care provider know about any changes, no matter how small.  If you are in your 20s or 30s, you should have a clinical breast exam (CBE) by a health care provider every 1-3 years as part of a regular health exam.  If you are 63 or older, have a CBE every year. Also consider having a breast X-ray (mammogram) every year.  If you have a family history of breast cancer, talk to your health care provider about genetic screening.  If you are at high risk for breast cancer, talk to your health care provider about having an MRI and a mammogram every year.  Breast cancer gene (BRCA) assessment is recommended for women who have family members with BRCA-related cancers. BRCA-related cancers include:  Breast.  Ovarian.  Tubal.  Peritoneal cancers.  Results of the assessment will determine the need for genetic counseling and BRCA1 and BRCA2 testing. Cervical Cancer  Your health care provider may recommend that you be screened regularly for cancer of the pelvic organs (ovaries, uterus, and vagina). This screening involves a pelvic examination, including checking for microscopic changes to the surface of your cervix (Pap test). You may be encouraged to have this screening done every 3 years, beginning at age 44.  For women ages 58-65, health care providers may recommend pelvic exams and Pap testing every 3 years, or they may recommend the Pap and pelvic exam, combined with testing for human  papilloma virus (HPV), every 5 years. Some types of HPV increase your risk of cervical cancer. Testing for HPV may also be done on women of any age with unclear Pap test results.  Other health care providers may not recommend any screening for nonpregnant women who are considered low risk for pelvic cancer and who do not have symptoms. Ask your health care provider if a screening pelvic exam is right for you.  If you have had past treatment for cervical cancer or a condition that could lead to cancer, you need Pap tests and screening for cancer for at least 20 years after your treatment. If Pap tests have been discontinued, your risk factors (such as having a new sexual partner) need to be reassessed to determine if screening should resume. Some women have medical problems that increase the chance of getting cervical cancer. In these cases, your health care provider may recommend more frequent screening and Pap tests. Colorectal Cancer  This type of cancer can be detected and often prevented.  Routine colorectal cancer screening usually begins at 55 years of age and continues through 55 years of age.  Your health care provider may recommend screening at an earlier age if you have risk factors for colon cancer.  Your health care provider may also recommend using home test kits to check for hidden blood in the stool.  A small camera at the end of a tube can be used to examine your colon directly (sigmoidoscopy or colonoscopy). This is done to check for the earliest forms of colorectal cancer.  Routine screening usually begins at age 65.  Direct examination of the colon should be repeated every 5-10 years through 55 years of age. However, you may need to be screened more often if early forms of precancerous polyps or small growths are found. Skin Cancer  Check your skin from head to toe regularly.  Tell your health care provider about any new moles or changes in moles, especially if there is a  change in a mole's shape or color.  Also  tell your health care provider if you have a mole that is larger than the size of a pencil eraser.  Always use sunscreen. Apply sunscreen liberally and repeatedly throughout the day.  Protect yourself by wearing long sleeves, pants, a wide-brimmed hat, and sunglasses whenever you are outside. Heart disease, diabetes, and high blood pressure  High blood pressure causes heart disease and increases the risk of stroke. High blood pressure is more likely to develop in:  People who have blood pressure in the high end of the normal range (130-139/85-89 mm Hg).  People who are overweight or obese.  People who are African American.  If you are 28-63 years of age, have your blood pressure checked every 3-5 years. If you are 1 years of age or older, have your blood pressure checked every year. You should have your blood pressure measured twice-once when you are at a hospital or clinic, and once when you are not at a hospital or clinic. Record the average of the two measurements. To check your blood pressure when you are not at a hospital or clinic, you can use:  An automated blood pressure machine at a pharmacy.  A home blood pressure monitor.  If you are between 32 years and 10 years old, ask your health care provider if you should take aspirin to prevent strokes.  Have regular diabetes screenings. This involves taking a blood sample to check your fasting blood sugar level.  If you are at a normal weight and have a low risk for diabetes, have this test once every three years after 55 years of age.  If you are overweight and have a high risk for diabetes, consider being tested at a younger age or more often. Preventing infection Hepatitis B  If you have a higher risk for hepatitis B, you should be screened for this virus. You are considered at high risk for hepatitis B if:  You were born in a country where hepatitis B is common. Ask your health care  provider which countries are considered high risk.  Your parents were born in a high-risk country, and you have not been immunized against hepatitis B (hepatitis B vaccine).  You have HIV or AIDS.  You use needles to inject street drugs.  You live with someone who has hepatitis B.  You have had sex with someone who has hepatitis B.  You get hemodialysis treatment.  You take certain medicines for conditions, including cancer, organ transplantation, and autoimmune conditions. Hepatitis C  Blood testing is recommended for:  Everyone born from [birth year] through 1964.  Anyone with known risk factors for hepatitis C. Sexually transmitted infections (STIs)  You should be screened for sexually transmitted infections (STIs) including gonorrhea and chlamydia if:  You are sexually active and are younger than 55 years of age.  You are older than 55 years of age and your health care provider tells you that you are at risk for this type of infection.  Your sexual activity has changed since you were last screened and you are at an increased risk for chlamydia or gonorrhea. Ask your health care provider if you are at risk.  If you do not have HIV, but are at risk, it may be recommended that you take a prescription medicine daily to prevent HIV infection. This is called pre-exposure prophylaxis (PrEP). You are considered at risk if:  You are sexually active and do not regularly use condoms or know the HIV status of your partner(s).  You take  drugs by injection.  You are sexually active with a partner who has HIV. Talk with your health care provider about whether you are at high risk of being infected with HIV. If you choose to begin PrEP, you should first be tested for HIV. You should then be tested every 3 months for as long as you are taking PrEP. Pregnancy  If you are premenopausal and you may become pregnant, ask your health care provider about preconception counseling.  If you may become  pregnant, take 400 to 800 micrograms (mcg) of folic acid every day.  If you want to prevent pregnancy, talk to your health care provider about birth control (contraception). Osteoporosis and menopause  Osteoporosis is a disease in which the bones lose minerals and strength with aging. This can result in serious bone fractures. Your risk for osteoporosis can be identified using a bone density scan.  If you are 7 years of age or older, or if you are at risk for osteoporosis and fractures, ask your health care provider if you should be screened.  Ask your health care provider whether you should take a calcium or vitamin D supplement to lower your risk for osteoporosis.  Menopause may have certain physical symptoms and risks.  Hormone replacement therapy may reduce some of these symptoms and risks. Talk to your health care provider about whether hormone replacement therapy is right for you. Follow these instructions at home:  Schedule regular health, dental, and eye exams.  Stay current with your immunizations.  Do not use any tobacco products including cigarettes, chewing tobacco, or electronic cigarettes.  If you are pregnant, do not drink alcohol.  If you are breastfeeding, limit how much and how often you drink alcohol.  Limit alcohol intake to no more than 1 drink per day for nonpregnant women. One drink equals 12 ounces of beer, 5 ounces of wine, or 1 ounces of hard liquor.  Do not use street drugs.  Do not share needles.  Ask your health care provider for help if you need support or information about quitting drugs.  Tell your health care provider if you often feel depressed.  Tell your health care provider if you have ever been abused or do not feel safe at home. This information is not intended to replace advice given to you by your health care provider. Make sure you discuss any questions you have with your health care provider. Document Released: 08/20/2010 Document  Revised: 07/13/2015 Document Reviewed: 11/08/2014 [employer] Interactive Patient Education  2017 [employer]   Ordered dexa.  Try a little miralax in your coffee in the morning and make certain to drink your daily water as well.  Try posture changes at work. Stand up desk etc. If still bothering you after make an appt to discuss and we can consider further management.

## 2016-05-31 NOTE — Progress Notes (Signed)
Patient ID: Jennifer Sharp Sharp, female  DOB: May 03, 1960, 56 y.o.   MRN: [id number] Patient Care Team    Relationship Specialty Notifications Start End  Ma Hillock, DO PCP - General Family Medicine  09/13/15   Earnstine Regal, PA-C Physician Assistant Obstetrics and Gynecology  04/04/15    Comment: [facility]  Eldred Manges, MD Consulting Physician Obstetrics and Gynecology  04/13/15   Milus Banister, MD Attending Physician Gastroenterology  09/13/15   Heather [place], [place]  Optometry  09/13/15     Subjective:  Jennifer Sharp Sharp is a 56 y.o.  Female  present for CPE . All past medical history, surgical history, allergies, family history, immunizations, medications and social history were updated in the electronic medical record today. All recent labs, ED visits and hospitalizations within the last year were reviewed.  constipation: Again complains of constipation. She has known slow transit and is on iron supplement. She has stopped taking probiotic, she thought it was upsetting her stomach. She does drink water daily. She reports BM about 5 days apart. She has seen GI. She has been tried on Linzess and had a SE.   Shoulder tension: Pt has known right AC/clavical separation. She has chronic discomfort from this area and shoulders that worsen as the day progresses. She has been prescribed anti-inflammatory for daily use and uses prn. She feels the night time tension and discomfort has worsened.   Health maintenance: :  Colonoscopy: completed 11/28/2014, by Dr. Ardis Hughs, resutls normal. follow up 5 yr (Fhx). Personal history of polyps at 51 yr.  Mammogram: completed:08/04/2016, birads 1. At gyn office, reported normal. No copy.  Cervical cancer screening: last pap: 07/20/2014, results: normal, completed by: Dr. Leo Grosser (GYN). Uterine polyp removed 2017. Immunizations: tdap 08/2015 utd, Influenza UTD receives through work. (encouraged yearly Infectious disease screening: hiv and hep c  completed DEXA: Fhx osteoporosis, estrogen deficient. Long term use of nexium. Vit D normal.  Assistive device: None  Oxygen use: None Patient has a Dental home. Hospitalizations/ED visits: None  Depression screen [facility] 2/9 05/31/2016 09/13/2015  Decreased Interest 0 0  Down, Depressed, Hopeless 0 0  PHQ - 2 Score 0 0    Current Exercise Habits: Home exercise routine, Type of exercise: walking, Time (Minutes): 30, Frequency (Times/Week): 3, Weekly Exercise (Minutes/Week): 90, Intensity: Mild Exercise limited by: None identified   Immunization History  Administered Date(s) Administered  . Influenza Whole 11/19/2010  . Influenza-Unspecified 11/19/2014  . Tdap 09/13/2015     Past Medical History:  Diagnosis Date  . Ankle swelling    occasional   . Colon polyps 07/25/2010  . Complication of anesthesia    urinary retention  . DUB (dysfunctional uterine bleeding) 2016/2017   GYN did endometrial bx (BENIGN PATH) + pap 04/11/15; u/s to follow  . Edema   . Endometrial polyp   . GERD (gastroesophageal reflux disease) 07/25/2010   with schatzki's ring on endo 07/2010  . History of recurrent UTIs 07/25/2010  . Melanoma ([place]) 2006   on right arm  . Miscarriage   . OSA on CPAP 10/02/2011  . Premenstrual migraine    headaches since bleeding started  . Schatzki's ring   . Seasonal allergic rhinitis   . SI (sacroiliac) joint inflammation ([place])   . Tachycardia    H/O transient  tach. as a chlid   . Umbilical hernia 07/27/4501  . Unspecified constipation 07/19/2012   No Known Allergies Past Surgical History:  Procedure Laterality Date  .  COLONOSCOPY  02/2010   5 yr recall (FH colon cancer)  . DILATATION & CURRETTAGE/HYSTEROSCOPY WITH RESECTOCOPE N/A 04/21/2015   Path: benign. Procedure: [place];  Surgeon: Eldred Manges, MD;  Location: [facility];  Service: Gynecology;  Laterality: N/A;  . DILATION AND CURETTAGE OF UTERUS  [phone]  . DILATION AND  EVACUATION N/A 04/21/2015   Done for DUB/Menorrhagia.  Procedure: DILATATION AND EVACUATION;  Surgeon: Eldred Manges, MD;  Location: [facility];  Service: Gynecology;  Laterality: N/A;  . EXPLORATORY LAPAROTOMY    . HERNIA REPAIR     left inguinal  . MELANOMA EXCISION  2006   R arm  . oral surgeries  70's and 80's  . TONSILLECTOMY     childhood  . VENTRAL HERNIA REPAIR N/A 10/29/2012   Procedure: LAPAROSCOPIC VENTRAL HERNIA WITH MESH;  Surgeon: Shann Medal, MD;  Location: [facility];  Service: General;  Laterality: N/A;   Family History  Problem Relation Age of Onset  . Hyperlipidemia Mother   . Hypertension Mother   . Cancer Father 73    ish, colon- remission/ skin cancer  . Other Brother     stent placed  . Hypertension Brother   . Hyperlipidemia Brother   . Heart disease Brother     2 cardiac stents  . Other Daughter     heart ablation/ heart arrythmia  . ADD / ADHD Daughter     ADD  . Heart disease Daughter 63    tachyarrythmia, scheduled for ablation   . ADD / ADHD Son     ADD  . Cancer Paternal Uncle     colon  . Diabetes Maternal Grandmother   . Stroke Maternal Grandmother   . Hypertension Maternal Grandmother   . Alzheimer's disease Maternal Grandmother   . Diabetes Maternal Grandfather   . Other Maternal Grandfather     cardiac disease  . Cancer Paternal Grandmother     colon  . Allergy (severe) Brother     smoker  . Other Brother     respiratory problems   Social History   Social History  . Marital status: Married    Spouse name: N/A  . Number of children: N/A  . Years of education: N/A   Occupational History  . Not on file.   Social History Main Topics  . Smoking status: Never Smoker  . Smokeless tobacco: Never Used  . Alcohol use 0.0 oz/week     Comment: socially  . Drug use: No  . Sexual activity: Yes    Partners: Male     Comment: no dietary restrictions   Other Topics Concern  . Not on file   Social History Narrative   Married.  Right handed.   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.    Wears seatbelt. Smoke detector in the home.    Allergies as of 05/31/2016   No Known Allergies     Medication List       Accurate as of 05/31/16  9:08 AM. Always use your most recent med list.          calcium carbonate 600 MG Tabs tablet Commonly known as:  OS-CAL Take 600 mg by mouth daily.   cyclobenzaprine 10 MG tablet Commonly known as:  FLEXERIL Take 10 mg by mouth 3 (three) times daily as needed for muscle spasms.   diclofenac 75 MG EC tablet Commonly known as:  VOLTAREN Take 1 tablet (75 mg total) by mouth 2 (two) times  daily.   ferrous sulfate 325 (65 FE) MG tablet Take 325 mg by mouth daily with breakfast.   FISH OIL PO Take 1 capsule by mouth daily. Omega Red   multivitamin with minerals Tabs tablet Take 1 tablet by mouth daily.   NEXIUM 40 MG capsule Generic drug:  esomeprazole TAKE 1 CAPSULE BY MOUTH ONCE DAILY BEFORE BREAKFAST   PROBIOTIC ADVANCED PO Take by mouth.   ranitidine 300 MG tablet Commonly known as:  ZANTAC Take 1 tablet (300 mg total) by mouth at bedtime.   vitamin B-12 1000 MCG tablet Commonly known as:  CYANOCOBALAMIN 1000 mcg daily for 7 days and then 1000 mcg weekly        No results found for this or any previous visit (from the past 2160 hour(s)).  No results found.   ROS: 14 pt review of systems performed and negative (unless mentioned in an HPI)  Objective: BP 112/75 (BP Location: Left Arm, Patient Position: Sitting, Cuff Size: Normal)   Pulse 80   Temp 97.3 F (36.3 C)   Resp 20   Ht 5' 7"  (1.702 m)   Wt 166 lb 8 oz (75.5 kg)   SpO2 99%   BMI 26.08 kg/m  Gen: Afebrile. No acute distress. Nontoxic in appearance, well-developed, well-nourished,  Pleasant caucasian female.  HENT: AT. [place]. Bilateral TM visualized and normal in appearance, normal external auditory canal. MMM, no oral lesions, adequate dentition. Bilateral nares within normal limits. Throat without  erythema, ulcerations or exudates. no Cough on exam, no hoarseness on exam. Eyes:Pupils Equal Round Reactive to light, Extraocular movements intact,  Conjunctiva without redness, discharge or icterus. Neck/lymp/endocrine: Supple,no lymphadenopathy, no thyromegaly CV: RRR no murmur, noedema, +2/4 P posterior tibialis pulses. no carotid bruits. No JVD. Chest: CTAB, no wheeze, rhonchi or crackles. Normal  Respiratory effort. good Air movement. Abd: Soft. round. NTND. BS present/normal. no Masses palpated. No hepatosplenomegaly. No rebound tenderness or guarding. Skin: no rashes, purpura or petechiae. Warm and well-perfused. Skin intact. Neuro/Msk:  Normal gait. PERLA. EOMi. Alert. Oriented x3.  Cranial nerves II through XII intact. Muscle strength 5/5 upper/lower extremity. Chronic Separated right clavicle/AC DTRs equal bilaterally. Psych: Normal affect, dress and demeanor. Normal speech. Normal thought content and judgment.   Assessment/plan: Jennifer Sharp Sharp is a 56 y.o. female present for CPE.  Other hyperlipidemia - Lipid panel Encounter for long-term (current) use of medications - Comp Met (CMET) History of anemia - CBC w/Diff Screening for diabetes mellitus - HgB A1c Estrogen deficiency/At high risk for osteoporosis - Fh osteoporosis. Estrogen deficient.  - DG Bone Density; Future Other fatigue - TSH BMI 26.0-26.9,adult - diet and exercise encouraged - Lipid panel - TSH - HgB A1c OSA on CPAP - continue with pulmonology. She is going to try the mouth peice mechanism.  Slow transit constipation - Maintain hydration, exercise, probiotic and try Miralax taper daily.She does not desire prescribed medication and had a SE to linzess.   Osteoarthritis of right sternoclavicular joint - diclofenac (VOLTAREN) 75 MG EC tablet; Take 1 tablet (75 mg total) by mouth 2 (two) times daily.  Dispense: 60 tablet; Refill: 2 - discussed posture changes at work for neck/shoulder discomfort. Continue  diclofenac - consider SM or ortho vs PT referrals if unchanged by altering posture at work.  - if desires can pursue further work up by appt to discuss issue after posture changes.  Encounter for general adult medical examination with abnormal findings Patient was encouraged to exercise greater than 150 minutes  a week. Patient was encouraged to choose a diet filled with fresh fruits and vegetables, and lean meats. AVS provided to patient today for education/recommendation on gender specific health and safety maintenance. Immunizations UTD.  ID--> completed.  Mam/PAP UTD--> through GYN Dexa ordered for high risk.  Return in about 1 year (around 05/31/2017) for CPE.  Electronically signed by: Howard Pouch, DO [place]

## 2016-06-03 ENCOUNTER — Encounter: Payer: Self-pay | Admitting: *Deleted

## 2016-06-03 ENCOUNTER — Telehealth: Payer: Self-pay | Admitting: Family Medicine

## 2016-06-03 DIAGNOSIS — R7303 Prediabetes: Secondary | ICD-10-CM | POA: Insufficient documentation

## 2016-06-03 DIAGNOSIS — R7309 Other abnormal glucose: Secondary | ICD-10-CM

## 2016-06-03 NOTE — Telephone Encounter (Signed)
Please call patient: - All her labs are stable.  - A1c, diabetes screen is mildly elevated at 5.9. Technically this places her in the prediabetes range, and we did want follow-up in 6 months with a repeat A1c to ensure she is not progressing towards diabetes. I would recommend increasing exercise, watching her carbohydrates/sugar intake over that time.

## 2016-06-03 NOTE — Telephone Encounter (Signed)
Sent message in [place] and left message on voice mail.

## 2016-06-06 ENCOUNTER — Telehealth: Payer: Self-pay | Admitting: Pulmonary Disease

## 2016-06-06 NOTE — Telephone Encounter (Signed)
Sleep study from 2013 printed and faxed to the number that was provided

## 2016-06-13 ENCOUNTER — Ambulatory Visit (INDEPENDENT_AMBULATORY_CARE_PROVIDER_SITE_OTHER)
Admission: RE | Admit: 2016-06-13 | Discharge: 2016-06-13 | Disposition: A | Discharge status: Home / Self Care | Payer: 59 | Source: Ambulatory Visit | Attending: Family Medicine | Admitting: Family Medicine

## 2016-06-13 DIAGNOSIS — E2839 Other primary ovarian failure: Secondary | ICD-10-CM | POA: Diagnosis not present

## 2016-06-13 DIAGNOSIS — Z9189 Other specified personal risk factors, not elsewhere classified: Secondary | ICD-10-CM

## 2016-06-13 DIAGNOSIS — M810 Age-related osteoporosis without current pathological fracture: Secondary | ICD-10-CM | POA: Insufficient documentation

## 2016-06-17 MED FILL — raNITIdine HCL 300 MG TABS: 300 | 90 days supply | Qty: 90 | Fill #2

## 2016-06-19 ENCOUNTER — Telehealth: Payer: Self-pay | Admitting: Family Medicine

## 2016-06-19 NOTE — Telephone Encounter (Signed)
Please call pt: - her bone mineral density scan was normal.

## 2016-06-19 NOTE — Telephone Encounter (Signed)
Spoke with patient reviewed results.

## 2016-07-17 DIAGNOSIS — M25561 Pain in right knee: Secondary | ICD-10-CM | POA: Diagnosis not present

## 2016-07-17 DIAGNOSIS — M25562 Pain in left knee: Secondary | ICD-10-CM | POA: Diagnosis not present

## 2016-07-17 DIAGNOSIS — G4733 Obstructive sleep apnea (adult) (pediatric): Secondary | ICD-10-CM | POA: Diagnosis not present

## 2016-09-11 ENCOUNTER — Ambulatory Visit (INDEPENDENT_AMBULATORY_CARE_PROVIDER_SITE_OTHER): Payer: 59 | Admitting: Family Medicine

## 2016-09-11 VITALS — BP 110/80 | Ht 66.5 in | Wt 162.0 lb

## 2016-09-11 DIAGNOSIS — S93401D Sprain of unspecified ligament of right ankle, subsequent encounter: Secondary | ICD-10-CM | POA: Diagnosis not present

## 2016-09-11 DIAGNOSIS — S93401A Sprain of unspecified ligament of right ankle, initial encounter: Secondary | ICD-10-CM

## 2016-09-11 DIAGNOSIS — M25371 Other instability, right ankle: Secondary | ICD-10-CM

## 2016-09-11 NOTE — Progress Notes (Signed)
Chief complaint:  Right ankle pain/instability 3 weeks  History of present illness: Jennifer Sharp is a 56 year old Caucasian female, with past medical history notable for GERD, hyperlipidemia, history of sleep apnea on CPAP, and vitamin B12 deficiency, who presents to the sports medicine office today with chief complaint of right ankle pain and instability in the right ankle. She reports that symptoms have been present for approximately 3 weeks. She reports that back on July 1, she was walking down some steps, reports that she thought she was on the last step when she truly wasn't, misstepped on the last step and fell forward, landing on her knees. She does not recall if her ankle inverted or if she had any type of abnormal movement when fall occured. She reports no immediate effusion or pain, but reported throughout the rest of the afternoon and evening a slight pain on lateral aspect of right ankle. She reports that she did notice a very slight amount of swelling later that evening. Since that time, she reports that she has tried relative rest, ice, has used an old prescription of diclofenac 75 mg, took a few tablets of it, with slight improvement in symptoms. She reports being unsteady and feeling unstable on her right ankle. She reports a distant injury to her right ankle back approximately 30 years ago were she did tear a few ligaments in her right ankle on the lateral side. She reports over the last 30 years having episodes of instability in her right ankle. She describes episodes where she would just be walking on the floor in her house and her right ankle would roll. She does not report of any numbness, tingling, warmth, erythema, ecchymosis, or weakness otherwise in her right lower extremity and ankle. She has not had any imaging so far for this acute injury. She does not report of any right knee pain. She does not report of any right foot pain.  Past medical history: Hyperlipidemia GERD Obstructive sleep  apnea on CPAP Vitamin B12 deficiency Melanoma Dysfunctional uterine bleeding  Past surgical history: D&C with hysteroscopy Sports for a laparotomy Ventral hernia repair Melanoma excision Oral surgeries Tonsillectomy  Family history: Mother-hyperlipidemia, hypertension, CVA, Alzheimer's, type 2 diabetes Father-skin cancer, colon cancer Brother-heart disease, hyperlipidemia, hypertension  Social history: She does not report of any current tobacco use, reports of occasional/social alcohol use, does not report of any illicit drug use, is married, lives at home with her husband, is Counselling psychologist at [employer]: No known drug allergies  Review of systems: As stated above  Physical exam: Vital signs reviewed and are documented in chart General: Alert, oriented, appears stated age, in no apparent distress HEENT: Moist oral mucosa Respiratory: Normal respirations, able to speak in full sentences Cardiac: Normal peripheral pulses, regular rate Integumentary: No rashes on visible skin Neurologic: Strength 5/5 in bilateral lower extremities, sensation 2+ in bilateral lower extremities Psychiatric: Appropriate affect and mood Musculoskeletal: Inspection of right ankle does not reveal any obvious deformity or atrophy, no warmth, erythema, ecchymosis, or effusion noted, no tenderness to palpation noted along medial malleolus, lateral malleolus, Lisfranc, base of fifth metatarsal, and navicular bone, no tenderness along the ATFL, CFL, PTFL, no tenderness in the deltoid ligament area, she does have positive anterior drawer sign, sulcus sign seen, as well as 3+ talar tilt on right side, he does have 1+ talar tilt on the left equal, anterior drawer was negative on the left ankle  Assessment and plan: 1. Acute ankle sprain 2. Chronic ankle  instability  Ankle sprain [place] ankle was negative, no evidence to need x-ray imaging at this time to rule out any fractures -Will send in  diclofenac 75 mg to be used daily, given symptoms of reflux discussed with her to use Zantac in addition to minimize symptoms  Chronic ankle instability -She does have very loose lateral ligaments, will have patient fitted for ASO brace, discussed the use when walking, as she does have high tendency to spontaneously roll her right ankle, discussed she does not have to wear this brace when wearing hiking shoes -Discussed home physical therapy exercises to strengthen the lateral ligaments, using TheraBand  She will send [place] up-to-date via MyChart in 3 weeks. If she does not report of any interval improvement, would like to have her be seen at that time. Otherwise, she will follow-up on as-needed basis.  Mort Sawyers, MD Primary Care Sports Medicine Fellow [facility]

## 2016-09-24 MED FILL — DICLOFENAC SODIUM 75 MG TAB: 75 | 30 days supply | Qty: 60 | Fill #0

## 2016-10-01 ENCOUNTER — Encounter: Payer: Self-pay | Admitting: *Deleted

## 2016-10-09 ENCOUNTER — Other Ambulatory Visit: Payer: Self-pay | Admitting: Family Medicine

## 2016-10-09 MED FILL — raNITIdine HCL 300 MG TABS: 300 | 90 days supply | Qty: 90 | Fill #0

## 2016-10-24 DIAGNOSIS — Z1231 Encounter for screening mammogram for malignant neoplasm of breast: Secondary | ICD-10-CM | POA: Diagnosis not present

## 2016-10-24 DIAGNOSIS — Z01419 Encounter for gynecological examination (general) (routine) without abnormal findings: Secondary | ICD-10-CM | POA: Diagnosis not present

## 2016-10-24 LAB — HM MAMMOGRAPHY

## 2016-10-28 ENCOUNTER — Encounter: Payer: Self-pay | Admitting: *Deleted

## 2017-01-20 MED FILL — raNITIdine HCL 300 MG TABS: 300 | 90 days supply | Qty: 90 | Fill #1

## 2017-01-31 DIAGNOSIS — G4733 Obstructive sleep apnea (adult) (pediatric): Secondary | ICD-10-CM | POA: Diagnosis not present

## 2017-02-19 ENCOUNTER — Ambulatory Visit (INDEPENDENT_AMBULATORY_CARE_PROVIDER_SITE_OTHER): Payer: 59 | Admitting: Family Medicine

## 2017-02-19 ENCOUNTER — Encounter: Payer: Self-pay | Admitting: Family Medicine

## 2017-02-19 VITALS — BP 122/82 | Ht 66.5 in | Wt 165.0 lb

## 2017-02-19 DIAGNOSIS — Q740 Other congenital malformations of upper limb(s), including shoulder girdle: Secondary | ICD-10-CM | POA: Diagnosis not present

## 2017-02-19 DIAGNOSIS — G2589 Other specified extrapyramidal and movement disorders: Secondary | ICD-10-CM | POA: Insufficient documentation

## 2017-02-19 DIAGNOSIS — M7671 Peroneal tendinitis, right leg: Secondary | ICD-10-CM

## 2017-02-19 DIAGNOSIS — M25511 Pain in right shoulder: Secondary | ICD-10-CM | POA: Diagnosis not present

## 2017-02-19 DIAGNOSIS — G8929 Other chronic pain: Secondary | ICD-10-CM

## 2017-02-19 HISTORY — DX: Other congenital malformations of upper limb(s), including shoulder girdle: Q74.0

## 2017-02-19 NOTE — Assessment & Plan Note (Signed)
Patient is here with complaints consistent with posttraumatic peroneal tendinitis.  No signs of instability.  Exam yielded some flattening of her longitudinal arches. -Continue home exercises. -Patient is to make an appointment for custom orthotics.

## 2017-02-19 NOTE — Progress Notes (Signed)
HPI  CC: Right shoulder (new) and right ankle pain (f/u) Patient is presenting today with new onset right-sided shoulder pain and to follow-up regarding her right ankle pain.  Regarding her ankle patient states that she has been doing her home exercise regimen.  She reports significant improvement since the last visit but she continues to have persistent pain along the lateral aspect of the ankle.  Specifically just posterior to the lateral malleolus.  Pain is experienced with regular ambulation.  It is persistent and aching but does not keep her from ambulating.  She denies any feelings of regular instability.  No weakness, numbness, or paresthesias.  No new injuries since her last sprained ankle.  Regarding her right shoulder she states that this has been gradually worsening over the past 2 months.  She has a known history of right-sided clavicular hypoplasia and she feels as though her clavicle on this side is beginning to shift posteriorly.  She has regular pain over the majority of her shoulder with overhead movements and lateral movements.  She denies any trauma, injury, or event that initiated this discomfort.  She is only just now reporting this discomfort because she has begun developing some numbness and paresthesias over the third fourth and fifth digits of the right hand.  She endorses no change in her range of motion.  No weakness in her hand or arm.  Medications/Interventions Tried: Home exercises and NSAIDs for her ankle, relative rest for her shoulder.  See HPI and/or previous note for associated ROS.  Objective: BP 122/82   Ht 5' 6.5" (1.689 m)   Wt 165 lb (74.8 kg)   BMI 26.23 kg/m  Gen: NAD, well groomed, a/o x3, normal affect.  CV: RRR. Warm. Cap refill <2 sec and equal. Pulses equal bilat. No subclavian bruits.  Right hand slightly more erythematous than the left. Resp: Non-labored.  Neuro: Sensation intact throughout. No gross coordination deficits.  Gait: Nonpathologic  posture, unremarkable stride without signs of limp or balance issues. Ankle/Foot, Right: TTP noted at the posterior aspect of the lateral malleolus extending proximally. No visible erythema, swelling, ecchymosis, or bony deformity. Mild bilateral pes planus deformity; No significant evidence of tibiotalar deviation; Range of motion is full in all directions. Strength is 5/5 in all directions. No obvious peroneal tendon subluxation; Stable lateral and medial ligaments; Talar dome nontender; No pain at base of 5th MT; No tenderness at the distal metatarsals; Able to walk 4 steps.  Shoulder, Right: TTP noted at the anterior lateral and posterior aspect of the shoulder, extending medially across the trapezius muscle. No evidence of bony deformity, asymmetry, or muscle atrophy; notable increased redness of the right hand compared to the left >> no significant change to cap refill. No tenderness over long head of biceps (bicipital groove). No TTP at [facility] joint. Full active and passive range of motion (180 flex H[place] /150Abd /90ER /70IR). Strength 5/5 throughout. Moderate/severe abnormal scapular function observed. Sensation intact with occasional worsening of paresthesias in the ulnar nerve distribution. Peripheral pulses intact.  Special Tests:   - Crossarm test: NEG   - Empty can: NEG   - Hawkins: mildly positive   - Obrien's test: NEG   - Yergason's: NEG   - Speeds test: NEG   Assessment and plan:  Peroneal tendinitis, right leg Patient is here with complaints consistent with posttraumatic peroneal tendinitis.  No signs of instability.  Exam yielded some flattening of her longitudinal arches. -Continue home exercises. -Patient is to make an  appointment for custom orthotics.  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  Discomfort seems to be worsening overall and relatively generalized.  There is some concern that patient  may have symptoms of thoracic outlet syndrome likely directly related to her hypoplastic clavicle.  Evidence of scapular dyskinesia noted on exam.  This may also be related to her hypoplastic clavicle as she likely has been compensating for this instability of the shoulder girdle throughout her lifetime.  There is no AC joint tenderness on exam.  No subclavian bruit.  Pulses and cap refill are equal, however her right hand was more erythematous than the left (patient had not noticed this change until it was pointed out today). -Obtaining clavicular and shoulder x-rays today. -We will curbside orthopedics to decide on further imaging. -Scapular strengthening exercises provided today. -We will contact patient with x-ray results and further recommendations.  Scapular dyskinesis See plan above   Orders Placed This Encounter  Procedures  . DG Clavicle Right    Standing Status:   Future    Standing Expiration Date:   04/20/2018    Order Specific Question:   Reason for Exam (SYMPTOM  OR DIAGNOSIS REQUIRED)    Answer:   right shoulder pain    Order Specific Question:   Is patient pregnant?    Answer:   No    Order Specific Question:   Preferred imaging location?    Answer:   [facility]    Order Specific Question:   Radiology Contrast Protocol - do NOT remove file path    Answer:   [email or web address]  . DG Shoulder Right    Standing Status:   Future    Standing Expiration Date:   04/20/2018    Order Specific Question:   Reason for Exam (SYMPTOM  OR DIAGNOSIS REQUIRED)    Answer:   right shoulder pain    Order Specific Question:   Is patient pregnant?    Answer:   No    Order Specific Question:   Preferred imaging location?    Answer:   [facility]    Order Specific Question:   Radiology Contrast Protocol - do NOT remove file path    Answer:   [email or web address]    Jennifer Leatherwood,  MD,MS [place] Sports Medicine Fellow 02/19/2017 5:55 PM

## 2017-02-19 NOTE — Assessment & Plan Note (Signed)
See plan above.

## 2017-02-19 NOTE — Assessment & Plan Note (Signed)
Patient is here with signs and symptoms of right-sided shoulder pain.  Discomfort seems to be worsening overall and relatively generalized.  There is some concern that patient may have symptoms of thoracic outlet syndrome likely directly related to her hypoplastic clavicle.  Evidence of scapular dyskinesia noted on exam.  This may also be related to her hypoplastic clavicle as she likely has been compensating for this instability of the shoulder girdle throughout her lifetime.  There is no AC joint tenderness on exam.  No subclavian bruit.  Pulses and cap refill are equal, however her right hand was more erythematous than the left (patient had not noticed this change until it was pointed out today). -Obtaining clavicular and shoulder x-rays today. -We will curbside orthopedics to decide on further imaging. -Scapular strengthening exercises provided today. -We will contact patient with x-ray results and further recommendations.

## 2017-02-20 ENCOUNTER — Ambulatory Visit (HOSPITAL_COMMUNITY)
Admission: RE | Admit: 2017-02-20 | Discharge: 2017-02-20 | Disposition: A | Discharge status: Home / Self Care | Payer: 59 | Source: Ambulatory Visit | Attending: Family Medicine | Admitting: Family Medicine

## 2017-02-20 DIAGNOSIS — S42021A Displaced fracture of shaft of right clavicle, initial encounter for closed fracture: Secondary | ICD-10-CM | POA: Diagnosis not present

## 2017-02-20 DIAGNOSIS — S42001A Fracture of unspecified part of right clavicle, initial encounter for closed fracture: Secondary | ICD-10-CM | POA: Diagnosis not present

## 2017-02-20 DIAGNOSIS — M25511 Pain in right shoulder: Secondary | ICD-10-CM | POA: Diagnosis not present

## 2017-02-20 DIAGNOSIS — G8929 Other chronic pain: Secondary | ICD-10-CM | POA: Insufficient documentation

## 2017-02-20 DIAGNOSIS — X58XXXA Exposure to other specified factors, initial encounter: Secondary | ICD-10-CM | POA: Diagnosis not present

## 2017-02-21 ENCOUNTER — Telehealth: Payer: Self-pay | Admitting: Family Medicine

## 2017-02-21 NOTE — Telephone Encounter (Signed)
Contacted patient to discuss x-rays.  Patient is going on vacation tomorrow.  We will talk when she gets back in town about further imaging studies to look into her possible thoracic outlet syndrome.

## 2017-03-05 ENCOUNTER — Encounter: Payer: 59 | Admitting: Family Medicine

## 2017-03-12 ENCOUNTER — Telehealth: Payer: Self-pay | Admitting: Family Medicine

## 2017-03-12 NOTE — Telephone Encounter (Signed)
Telephone call:  Called patient to discuss recurrent status regarding her upper extremity symptoms.  I had discussed, briefly, her case with orthopedics who encouraged seeking advice from a specialist at [facility].  We are currently awaiting a reply to this inquiry.  Patient was very grateful for this communication and did not seem upset hearing that she would have to wait for a more definitive recommendation regarding her case.  During our discussion patient also inquired about my opinion on some custom orthotics from an outside company.  I encouraged her to look into this after hearing her initial impression with the sample orthotics she tried on.  I informed patient that if those orthotics did not work out as well as she hopes and we would be happy to assist her regarding orthotics in the future.  I will be contacting patient once hearing back from [facility].  Elberta Leatherwood, MD,MS [facility] 03/12/2017 5:38 PM

## 2017-03-14 ENCOUNTER — Encounter: Payer: Self-pay | Admitting: Family Medicine

## 2017-03-19 ENCOUNTER — Other Ambulatory Visit: Payer: Self-pay

## 2017-03-19 DIAGNOSIS — G8929 Other chronic pain: Secondary | ICD-10-CM

## 2017-03-19 DIAGNOSIS — M25511 Pain in right shoulder: Principal | ICD-10-CM

## 2017-03-19 NOTE — Telephone Encounter (Signed)
-----   Message from [place] sent at 03/18/2017  4:11 PM EST ----- Regarding: phone message Pt called asking if you saw her Mychart message? She is asking if you would call her to discuss a plan. Or she is willing to come in for an appt tomorrow.

## 2017-03-19 NOTE — Telephone Encounter (Signed)
Telephone call:  I contacted patient to discuss her case.  I had discussed her case with Dr. Mardelle Matte of [facility].  He had recommended contacting cardiothoracic surgery to have patient evaluated.  I informed patient of this recommendation and we decided together to move forward with a referral to cardiothoracic surgery.  Per Dr. Mardelle Matte, Dr. Servando Snare is a local cardiothoracic surgeon with some interest in thoracic outlet syndrome.  -Referral to cardiothoracic surgery has been placed.  Elberta Leatherwood, MD,MS [facility] 03/19/2017 2:53 PM

## 2017-03-26 ENCOUNTER — Encounter: Payer: 59 | Admitting: Family Medicine

## 2017-04-09 DIAGNOSIS — M25511 Pain in right shoulder: Secondary | ICD-10-CM | POA: Diagnosis not present

## 2017-04-18 ENCOUNTER — Other Ambulatory Visit: Payer: Self-pay

## 2017-04-18 ENCOUNTER — Encounter: Payer: Self-pay | Admitting: Vascular Surgery

## 2017-04-18 ENCOUNTER — Ambulatory Visit: Payer: 59 | Admitting: Vascular Surgery

## 2017-04-18 VITALS — BP 117/83 | HR 82 | Temp 97.5°F | Resp 16 | Ht 66.5 in | Wt 164.0 lb

## 2017-04-18 DIAGNOSIS — M25511 Pain in right shoulder: Secondary | ICD-10-CM | POA: Diagnosis not present

## 2017-04-18 DIAGNOSIS — R200 Anesthesia of skin: Secondary | ICD-10-CM

## 2017-04-18 DIAGNOSIS — R202 Paresthesia of skin: Secondary | ICD-10-CM

## 2017-04-18 DIAGNOSIS — M79601 Pain in right arm: Secondary | ICD-10-CM
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
## 2017-04-21 MED FILL — raNITIdine HCL 300 MG TABS: 300 | 90 days supply | Qty: 90 | Fill #2

## 2017-04-28 ENCOUNTER — Other Ambulatory Visit: Payer: Self-pay

## 2017-04-28 DIAGNOSIS — M79601 Pain in right arm: Secondary | ICD-10-CM

## 2017-04-28 DIAGNOSIS — R200 Anesthesia of skin: Secondary | ICD-10-CM

## 2017-05-05 ENCOUNTER — Other Ambulatory Visit: Payer: 59

## 2017-05-06 ENCOUNTER — Telehealth: Payer: Self-pay | Admitting: Pulmonary Disease

## 2017-05-06 NOTE — Telephone Encounter (Signed)
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 VS saw her for sleep apnea.  She needs ROV with VS to discuss transitioning from oral appliance back to CPAP.  Left VM for patient to return phone call today to schedule appt with VS as soon as possible  Okay to double book visit.  Will follow up

## 2017-05-07 ENCOUNTER — Ambulatory Visit
Admission: RE | Admit: 2017-05-07 | Discharge: 2017-05-07 | Disposition: A | Discharge status: Home / Self Care | Payer: 59 | Source: Ambulatory Visit | Attending: Vascular Surgery | Admitting: Vascular Surgery

## 2017-05-07 DIAGNOSIS — M79601 Pain in right arm: Secondary | ICD-10-CM

## 2017-05-07 DIAGNOSIS — R200 Anesthesia of skin: Secondary | ICD-10-CM

## 2017-05-07 DIAGNOSIS — M47812 Spondylosis without myelopathy or radiculopathy, cervical region: Secondary | ICD-10-CM | POA: Diagnosis not present

## 2017-05-07 MED ORDER — GADOBENATE DIMEGLUMINE 529 MG/ML IV SOLN
15.0000 mL | Freq: Once | INTRAVENOUS | Status: AC | PRN
  Administered 2017-05-07: 15 mL via INTRAVENOUS

## 2017-05-08 ENCOUNTER — Other Ambulatory Visit: Payer: Self-pay | Admitting: *Deleted

## 2017-05-08 DIAGNOSIS — Q282 Arteriovenous malformation of cerebral vessels: Secondary | ICD-10-CM

## 2017-05-09 NOTE — Telephone Encounter (Signed)
Called patient unable to reach left message to give us a call back.

## 2017-05-12 ENCOUNTER — Encounter: Payer: Self-pay | Admitting: Neurology

## 2017-05-12 ENCOUNTER — Ambulatory Visit: Payer: 59 | Admitting: Neurology

## 2017-05-12 VITALS — BP 133/91 | HR 80 | Ht 66.5 in | Wt 167.5 lb

## 2017-05-12 DIAGNOSIS — R202 Paresthesia of skin: Secondary | ICD-10-CM

## 2017-05-12 DIAGNOSIS — Q283 Other malformations of cerebral vessels: Secondary | ICD-10-CM | POA: Insufficient documentation

## 2017-05-12 NOTE — Progress Notes (Signed)
PATIENT: Jennifer Sharp DOB: 1960/07/02  Chief Complaint  Patient presents with  . AVM    She is here with her husband, Merry Proud.  She started having right-sided shoulder pain, along intermittent numbness/tingling down her right arm, discoloration and cold sensation.  She has abnormal cervical MRI that she would like to further discuss.  Marland Kitchen PCP    Kuneff, Renee A, DO  . Vascular Surgeon    Waynetta Sandy, MD     HISTORICAL  Jennifer STOGSDILL is a 57 year old female, seen in refer by her primary care doctor Ma Hillock, vascular surgeon Dr. Donzetta Matters, [place], for evaluation of abnormal MRI cervical/brain, she is accompanied by her husband, initial evaluation was on May 12, 2017.  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  In October 2018, without clear triggers, she began to notice mild right anterior shoulder discomfort, later also noticed more neck pain, radiating achiness to bilateral shoulder, right worse than left.  By January 2019, she was noted to have right hand discoloration, also reported paresthesia radiating discomfort involving right lateral 3 fingers, mild intermittent subjective right hand weakness, such as difficulty opening a trial.  She was referred for MRI cervical spine on May 08, 2017: 15 mm lesion in the upper pons with evidence of previous hemorrhage, probable vascular malformation such as AVM, smaller area of chronic blood product at the left cerebellum, Cervical spondylitic disease, moderate foraminal narrowing at C5-6, mild foraminal stenosis at C6-7  She reported strong family history of brain vascular malformation, her younger brother had an incidental finding of brain AV malformation after complaining of headache, her niece from the same brother suffered a seizure at age 30, MRI  of the brain also find AVM,   Patient denies headaches, no gait abnormality, no bowel bladder incontinence, she enjoys hiking without any difficulties.  REVIEW OF SYSTEMS: Full 14 system review of systems performed and notable only for as above  ALLERGIES: No Known Allergies  HOME MEDICATIONS: Current Outpatient Medications  Medication Sig Dispense Refill  . calcium carbonate (OS-CAL) 600 MG TABS Take 600 mg by mouth daily.     . ferrous sulfate 325 (65 FE) MG tablet Take 325 mg by mouth daily with breakfast.    . NEXIUM 40 MG capsule TAKE 1 CAPSULE BY MOUTH ONCE DAILY BEFORE BREAKFAST (Patient taking differently: Take 40 mg by mouth daily as needed. TAKE 1 CAPSULE BY MOUTH ONCE DAILY BEFORE BREAKFAST) 90 capsule 3  . Omega-3 Fatty Acids (FISH OIL PO) Take 1 capsule by mouth daily. Omega Red    . ranitidine (ZANTAC) 300 MG tablet TAKE 1 TALET BY MOUTH AT BEDTIME 90 tablet 2  . vitamin B-12 (CYANOCOBALAMIN) 1000 MCG tablet 1000 mcg daily for 7 days and then 1000 mcg weekly 14 tablet 0   No current facility-administered medications for this visit.     PAST MEDICAL HISTORY: Past Medical History:  Diagnosis Date  . Ankle swelling    occasional   . Colon polyps 07/25/2010  . Complication of anesthesia    urinary retention  . DUB (dysfunctional uterine bleeding) 2016/2017   GYN did endometrial bx (BENIGN PATH) + pap 04/11/15; u/s to follow  . Edema   . Endometrial polyp   . GERD (gastroesophageal reflux disease) 07/25/2010   with schatzki's ring on endo 07/2010  .  History of recurrent UTIs 07/25/2010  . Melanoma ([place]) 2006   on right arm  . Miscarriage   . OSA on CPAP 10/02/2011  . Premenstrual migraine    headaches since bleeding started  . Schatzki's ring   . Seasonal allergic rhinitis   . SI (sacroiliac) joint inflammation ([place])   . Tachycardia    H/O transient  tach. as a chlid   . Umbilical hernia 04/18/5398  . Unspecified constipation 07/19/2012    PAST SURGICAL HISTORY: Past  Surgical History:  Procedure Laterality Date  . COLONOSCOPY  02/2010   5 yr recall (FH colon cancer)  . DILATATION & CURRETTAGE/HYSTEROSCOPY WITH RESECTOCOPE N/A 04/21/2015   Path: benign. Procedure: [place];  Surgeon: Eldred Manges, MD;  Location: [facility];  Service: Gynecology;  Laterality: N/A;  . DILATION AND CURETTAGE OF UTERUS  [phone]  . DILATION AND EVACUATION N/A 04/21/2015   Done for DUB/Menorrhagia.  Procedure: DILATATION AND EVACUATION;  Surgeon: Eldred Manges, MD;  Location: [facility];  Service: Gynecology;  Laterality: N/A;  . EXPLORATORY LAPAROTOMY    . HERNIA REPAIR     left inguinal  . MELANOMA EXCISION  2006   R arm  . oral surgeries  70's and 80's  . TONSILLECTOMY     childhood  . VENTRAL HERNIA REPAIR N/A 10/29/2012   Procedure: LAPAROSCOPIC VENTRAL HERNIA WITH MESH;  Surgeon: Shann Medal, MD;  Location: [facility];  Service: General;  Laterality: N/A;    FAMILY HISTORY: Family History  Problem Relation Age of Onset  . Hyperlipidemia Mother   . Hypertension Mother   . Stroke Mother   . Cancer Father 16       ish, colon- remission/ skin cancer  . Other Brother        stent placed//AVM  . Hypertension Brother   . Hyperlipidemia Brother   . Heart disease Brother        2 cardiac stents  . Other Daughter        heart ablation/ heart arrythmia  . ADD / ADHD Daughter        ADD  . Heart disease Daughter 84       tachyarrythmia, scheduled for ablation   . ADD / ADHD Son        ADD  . Cancer Paternal Uncle        colon  . Diabetes Maternal Grandmother   . Stroke Maternal Grandmother   . Hypertension Maternal Grandmother   . Alzheimer's disease Maternal Grandmother   . Diabetes Maternal Grandfather   . Other Maternal Grandfather        cardiac disease  . Cancer Paternal Grandmother        colon  . Allergy (severe) Brother        smoker  . Other Brother        respiratory problems    SOCIAL  HISTORY:  Social History   Socioeconomic History  . Marital status: Married    Spouse name: Not on file  . Number of children: 2  . Years of education: Masters  . Highest education level: Not on file  Occupational History  . Occupation: Mudlogger of ICU  Social Needs  . Financial resource strain: Not on file  . Food insecurity:    Worry: Not on file    Inability: Not on file  . Transportation needs:    Medical: Not on file    Non-medical: Not on file  Tobacco Use  .  Smoking status: Never Smoker  . Smokeless tobacco: Never Used  Substance and Sexual Activity  . Alcohol use: Yes    Alcohol/week: 0.0 oz    Comment: socially  . Drug use: No  . Sexual activity: Yes    Partners: Male    Comment: no dietary restrictions  Lifestyle  . Physical activity:    Days per week: Not on file    Minutes per session: Not on file  . Stress: Not on file  Relationships  . Social connections:    Talks on phone: Not on file    Gets together: Not on file    Attends religious service: Not on file    Active member of club or organization: Not on file    Attends meetings of clubs or organizations: Not on file    Relationship status: Not on file  . Intimate partner violence:    Fear of current or ex partner: Not on file    Emotionally abused: Not on file    Physically abused: Not on file    Forced sexual activity: Not on file  Other Topics Concern  . Not on file  Social History Narrative   Married. Right handed.   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    Wears seatbelt. Smoke detector in the home.    Caffeine use: 1 cup coffee per day.      PHYSICAL EXAM   Vitals:   05/12/17 1332  BP: (!) 133/91  Pulse: 80  Weight: 167 lb 8 oz (76 kg)  Height: 5' 6.5" (1.689 m)    Not recorded      Body mass index is 26.63 kg/m.  PHYSICAL EXAMNIATION:  Gen: NAD, conversant, well nourised, obese, well groomed                     Cardiovascular: Regular rate rhythm, no peripheral edema, warm,  nontender. Eyes: Conjunctivae clear without exudates or hemorrhage Neck: Supple, no carotid bruits. Pulmonary: Clear to auscultation bilaterally  Bilateral radial pulse was symmetric, there was no change by raising arm overhead, Musculoskeletal: Right clavicle is non-union to the right acromioclavicular joints, no tenderness, mild right shoulder droopiness,  NEUROLOGICAL EXAM:  MENTAL STATUS: Speech:    Speech is normal; fluent and spontaneous with normal comprehension.  Cognition:     Orientation to time, place and person     Normal recent and remote memory     Normal Attention span and concentration     Normal Language, naming, repeating,spontaneous speech     Fund of knowledge   CRANIAL NERVES: CN II: Visual fields are full to confrontation. Fundoscopic exam is normal with sharp discs and no vascular changes. Pupils are round equal and briskly reactive to light. CN III, IV, VI: extraocular movement are normal. No ptosis. CN V: Facial sensation is intact to pinprick in all 3 divisions bilaterally. Corneal responses are intact.  CN VII: Face is symmetric with normal eye closure and smile. CN VIII: Hearing is normal to rubbing fingers CN IX, X: Palate elevates symmetrically. Phonation is normal. CN XI: Head turning and shoulder shrug are intact CN XII: Tongue is midline with normal movements and no atrophy.  MOTOR: There is no pronator drift of out-stretched arms. Muscle bulk and tone are normal. Muscle strength is normal.  REFLEXES: Reflexes are 2+ and symmetric at the biceps, triceps, knees, and ankles. Plantar responses are flexor.  SENSORY: Intact to light touch, pinprick, positional sensation and vibratory sensation are intact in  fingers and toes.  COORDINATION: Rapid alternating movements and fine finger movements are intact. There is no dysmetria on finger-to-nose and heel-knee-shin.    GAIT/STANCE: Posture is normal. Gait is steady with normal steps, base, arm swing,  and turning. Heel and toe walking are normal. Tandem gait is normal.  Romberg is absent.   DIAGNOSTIC DATA (LABS, IMAGING, TESTING) - I reviewed patient records, labs, notes, testing and imaging myself where available.   ASSESSMENT AND PLAN  LANI HAVLIK is a 57 y.o. female   Incidental findings of pontine vascular malformation, 1.5 cm, cavernoma versus AVM 1  Will refer her to neurosurgeon Dr. Kathyrn Sheriff for evaluation  Right neck pain, radiating pain to right arm, anesthesia,  Differentiation diagnosis includes right cervical radiculopathy, versus right upper extremity neuropathy, such as ulnar neuropathy,  Proceed with EMG nerve conduction study   Marcial Pacas, M.D. Ph.D.  [facility] [address], [place], [place] [address] Ph: [phone] Fax: [phone]  [id number], Reinaldo Raddle, DO, Waynetta Sandy, MD

## 2017-05-12 NOTE — Patient Instructions (Signed)
Dr. Consuella Lose, MD

## 2017-05-13 ENCOUNTER — Telehealth: Payer: Self-pay | Admitting: Neurology

## 2017-05-13 LAB — COMPREHENSIVE METABOLIC PANEL
ALT: 14 IU/L (ref 0–32)
AST: 11 IU/L (ref 0–40)
Albumin/Globulin Ratio: 2.3 — ABNORMAL HIGH (ref 1.2–2.2)
Albumin: 4.6 g/dL (ref 3.5–5.5)
Alkaline Phosphatase: 73 IU/L (ref 39–117)
BUN/Creatinine Ratio: 16 (ref 9–23)
BUN: 14 mg/dL (ref 6–24)
Bilirubin Total: 0.4 mg/dL (ref 0.0–1.2)
CALCIUM: 9.5 mg/dL (ref 8.7–10.2)
CO2: 26 mmol/L (ref 20–29)
CREATININE: 0.9 mg/dL (ref 0.57–1.00)
Chloride: 101 mmol/L (ref 96–106)
GFR calc Af Amer: 83 mL/min/{1.73_m2} (ref >59)
GFR, EST NON AFRICAN AMERICAN: 72 mL/min/{1.73_m2} (ref >59)
GLOBULIN, TOTAL: 2 g/dL (ref 1.5–4.5)
GLUCOSE: 91 mg/dL (ref 65–99)
Potassium: 4.4 mmol/L (ref 3.5–5.2)
Sodium: 142 mmol/L (ref 134–144)
Total Protein: 6.6 g/dL (ref 6.0–8.5)

## 2017-05-13 NOTE — Telephone Encounter (Signed)
[facility] order sent to [facility] since she just had one at GI on 05/08/17. They will contact the patient to schedule. No auth with [facility].

## 2017-05-14 DIAGNOSIS — D1801 Hemangioma of skin and subcutaneous tissue: Secondary | ICD-10-CM | POA: Diagnosis not present

## 2017-05-14 DIAGNOSIS — L821 Other seborrheic keratosis: Secondary | ICD-10-CM | POA: Diagnosis not present

## 2017-05-14 DIAGNOSIS — L814 Other melanin hyperpigmentation: Secondary | ICD-10-CM | POA: Diagnosis not present

## 2017-05-14 DIAGNOSIS — D2270 Melanocytic nevi of unspecified lower limb, including hip: Secondary | ICD-10-CM | POA: Diagnosis not present

## 2017-05-14 DIAGNOSIS — D485 Neoplasm of uncertain behavior of skin: Secondary | ICD-10-CM | POA: Diagnosis not present

## 2017-05-14 NOTE — Telephone Encounter (Signed)
I was able to get a hold of Jennifer Sharp, she is scheduled (double booked) for Friday March 29 at 2pm.  Dr. Halford Chessman said okay to double book should be a quick visit.   Nothing further needed

## 2017-05-16 ENCOUNTER — Encounter: Payer: Self-pay | Admitting: Pulmonary Disease

## 2017-05-16 ENCOUNTER — Ambulatory Visit: Payer: 59 | Admitting: Pulmonary Disease

## 2017-05-16 VITALS — BP 118/88 | HR 89 | Ht 66.5 in | Wt 167.0 lb

## 2017-05-16 DIAGNOSIS — G4733 Obstructive sleep apnea (adult) (pediatric): Secondary | ICD-10-CM | POA: Diagnosis not present

## 2017-05-16 DIAGNOSIS — Z9989 Dependence on other enabling machines and devices: Secondary | ICD-10-CM

## 2017-05-16 NOTE — Progress Notes (Signed)
[facility]  Chief Complaint  Patient presents with  . Follow-up    OSA     Vital signs: BP 118/88 (BP Location: Right Arm, Cuff Size: Normal)   Pulse 89   Ht 5' 6.5" (1.689 m)   Wt 167 lb (75.8 kg)   SpO2 97%   BMI 26.55 kg/m   History of Present Illness: Jennifer Sharp is a 57 y.o. female with obstructive sleep apnea.  She tried using oral appliance.  Couldn't get this adjusted to correct position.  Went back to using CPAP.  This is working well.  No issues with mask fit.  She was evaluated for right hand numbness and color change.  Eventually found to have cervical spinal stenosis and AVM.  She was seen by neurology and has MRI brain and EMG scheduled.  Physical Exam:  General - pleasant Eyes - pupils reactive ENT - no sinus tenderness, no oral exudate, no LAN Cardiac - regular, no murmur Chest - no wheeze, rales Abd - soft, non tender Ext - no edema Skin - no rashes Neuro - normal strength Psych - normal mood   Assessment/Plan:  Obstructive sleep apnea. - she is compliant with CPAP and reports benefit from therapy - will arrange for new auto CPAP machine since her current device is more than 56 yrs old   There are no Patient Instructions on file for this visit.   Chesley Mires, MD [facility]monary/Critical Care 05/16/2017, 1:52 PM Pager:  [phone]  Flow Sheet  Sleep tests: HST 06/24/11 >> AHI 19.7, SaO2 low 75% CPAP 04/16/17 to 05/15/17 >> used on 29 of 30 nights with average 6 hrs.  Average AHI 0.8 with CPAP 8 cm H2O  Cardiac tests Echo 08/07/10 >> EF 55%  Past Medical History: She  has a past medical history of Ankle swelling, Colon polyps (07/20/8313), Complication of anesthesia, DUB (dysfunctional uterine bleeding) (2016/2017), Edema, Endometrial polyp, GERD (gastroesophageal reflux disease) (07/25/2010), History of recurrent UTIs (07/25/2010), Melanoma ([place]) (2006), Miscarriage, OSA on CPAP (10/02/2011),  Premenstrual migraine, Schatzki's ring, Seasonal allergic rhinitis, SI (sacroiliac) joint inflammation ([place]), Tachycardia, Umbilical hernia (02/24/6158), and Unspecified constipation (07/19/2012).  Past Surgical History: She  has a past surgical history that includes oral surgeries (70's and 80's); Dilation and curettage of uterus (73,71,06,26); Melanoma excision (2006); Tonsillectomy; Hernia repair; Exploratory laparotomy; Ventral hernia repair (N/A, 10/29/2012); Colonoscopy (02/2010); Dilatation & currettage/hysteroscopy with resectoscope (N/A, 04/21/2015); and Dilation and evacuation (N/A, 04/21/2015).  Family History: Her family history includes ADD / ADHD in her daughter and son; Allergy (severe) in her brother; Alzheimer's disease in her maternal grandmother; Cancer in her paternal grandmother and paternal uncle; Cancer (age of onset: 24) in her father; Diabetes in her maternal grandfather and maternal grandmother; Heart disease in her brother; Heart disease (age of onset: 95) in her daughter; Hyperlipidemia in her brother and mother; Hypertension in her brother, maternal grandmother, and mother; Other in her brother, brother, daughter, and maternal grandfather; Stroke in her maternal grandmother and mother.  Social History: She  reports that she has never smoked. She has never used smokeless tobacco. She reports that she drinks alcohol. She reports that she does not use drugs.  Medications: Allergies as of 05/16/2017   No Known Allergies     Medication List        Accurate as of 05/16/17  1:52 PM. Always use your most recent med list.          calcium carbonate 600  MG Tabs tablet Commonly known as:  OS-CAL Take 600 mg by mouth daily.   ferrous sulfate 325 (65 FE) MG tablet Take 325 mg by mouth daily with breakfast.   FISH OIL PO Take 1 capsule by mouth daily. Omega Red   NEXIUM 40 MG capsule Generic drug:  esomeprazole TAKE 1 CAPSULE BY MOUTH ONCE DAILY BEFORE BREAKFAST   ranitidine  300 MG tablet Commonly known as:  ZANTAC TAKE 1 TALET BY MOUTH AT BEDTIME   vitamin B-12 1000 MCG tablet Commonly known as:  CYANOCOBALAMIN 1000 mcg daily for 7 days and then 1000 mcg weekly

## 2017-05-16 NOTE — Patient Instructions (Signed)
Will arrange for new auto CPAP  Follow up in 2 months after getting CPAP machine

## 2017-05-17 ENCOUNTER — Ambulatory Visit
Admission: RE | Admit: 2017-05-17 | Discharge: 2017-05-17 | Disposition: A | Discharge status: Home / Self Care | Payer: 59 | Source: Ambulatory Visit | Attending: Neurology | Admitting: Neurology

## 2017-05-17 DIAGNOSIS — Q283 Other malformations of cerebral vessels: Secondary | ICD-10-CM | POA: Diagnosis not present

## 2017-05-17 MED ORDER — GADOBENATE DIMEGLUMINE 529 MG/ML IV SOLN
15.0000 mL | Freq: Once | INTRAVENOUS | Status: AC | PRN
  Administered 2017-05-17: 15 mL via INTRAVENOUS

## 2017-05-19 ENCOUNTER — Telehealth: Payer: Self-pay | Admitting: Neurology

## 2017-05-19 DIAGNOSIS — D1800 Hemangioma unspecified site: Secondary | ICD-10-CM

## 2017-05-19 HISTORY — DX: Hemangioma unspecified site: D18.00

## 2017-05-19 NOTE — Telephone Encounter (Signed)
Please call patient, MRI of the brain showed multiple foci of chronic hemo-product scattered in the the brainstem, cerebellum, superficial and deep hemisphere,  This findings most consistent with the diagnosis of multiple cavernoma syndrome, a genetic disorder,  I had released MRI result to my chart, and also the link of an associated article.    IMPRESSION: This MRI of the brain with and without contrast shows multiple foci in the brainstem, cerebellum and in the superficial and deep hemisphere with chronic heme products.  Given the patient's age, distribution of the foci and lack of cortical atrophy, this most likely represents multiple cavernoma syndrome, a genetic disorder, though cerebral amyloid angiopathy cannot be completely ruled out

## 2017-05-19 NOTE — Telephone Encounter (Signed)
I have called her MRI result.  She is referred to see neurosurgeon Dr. Kathyrn Sheriff   Impression: This MRI of the brain with and without contrast shows multiple  foci in the brainstem, cerebellum and in the superficial and deep  hemisphere with chronic heme products. Given the patient's age,  distribution of the foci and lack of cortical atrophy, this most likely  represents multiple cavernoma syndrome, a genetic disorder, though  cerebral amyloid angiopathy cannot be completely ruled out

## 2017-05-19 NOTE — Telephone Encounter (Signed)
Pt called requesting a call at [phone] once MRI results are in

## 2017-05-23 ENCOUNTER — Telehealth: Payer: Self-pay | Admitting: Pulmonary Disease

## 2017-05-23 NOTE — Telephone Encounter (Signed)
Noted  

## 2017-05-23 NOTE — Telephone Encounter (Signed)
rec'd staff message from [facility] to f/u with [facility] to see when pt will receive replacement CPAP machine.  Called and spoke with Melissa with [facility] The order was not rec'd on her end; order was placed on 05/16/17 and signed by VS Melissa is pulling order now, having someone from [facility] to contact pt for replacement Will route message to [facility] for review Will f/u with patient next week to see if replacement has be provided by [facility]

## 2017-06-05 DIAGNOSIS — G4733 Obstructive sleep apnea (adult) (pediatric): Secondary | ICD-10-CM | POA: Diagnosis not present

## 2017-06-16 DIAGNOSIS — Q283 Other malformations of cerebral vessels: Secondary | ICD-10-CM | POA: Diagnosis not present

## 2017-06-16 DIAGNOSIS — R030 Elevated blood-pressure reading, without diagnosis of hypertension: Secondary | ICD-10-CM | POA: Diagnosis not present

## 2017-06-16 DIAGNOSIS — Z6827 Body mass index (BMI) 27.0-27.9, adult: Secondary | ICD-10-CM | POA: Diagnosis not present

## 2017-06-18 ENCOUNTER — Ambulatory Visit (INDEPENDENT_AMBULATORY_CARE_PROVIDER_SITE_OTHER): Payer: 59 | Admitting: Neurology

## 2017-06-18 ENCOUNTER — Ambulatory Visit: Payer: 59 | Admitting: Neurology

## 2017-06-18 DIAGNOSIS — R202 Paresthesia of skin: Secondary | ICD-10-CM

## 2017-06-18 DIAGNOSIS — Q283 Other malformations of cerebral vessels: Secondary | ICD-10-CM

## 2017-06-18 NOTE — Telephone Encounter (Signed)
Patient has cpap machine with [facility] for 14 days as of today's date. Nothing further needed

## 2017-06-20 NOTE — Progress Notes (Signed)
PATIENT: Jennifer Sharp DOB: 05/03/1960  No chief complaint on file.    HISTORICAL  Jennifer Sharp is a 57 year old female, seen in refer by her primary care doctor Ma Hillock, vascular surgeon Dr. Donzetta Matters, [place], for evaluation of abnormal MRI cervical/brain, she is accompanied by her husband, initial evaluation was on May 12, 2017.  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  In October 2018, without clear triggers, she began to notice mild right anterior shoulder discomfort, later also noticed more neck pain, radiating achiness to bilateral shoulder, right worse than left.  By January 2019, she was noted to have right hand discoloration, also reported paresthesia radiating discomfort involving right lateral 3 fingers, mild intermittent subjective right hand weakness, such as difficulty opening a trial.  She was referred for MRI cervical spine on May 08, 2017: 15 mm lesion in the upper pons with evidence of previous hemorrhage, probable vascular malformation such as AVM, smaller area of chronic blood product at the left cerebellum, Cervical spondylitic disease, moderate foraminal narrowing at C5-6, mild foraminal stenosis at C6-7  She reported strong family history of brain vascular malformation, her younger brother had an incidental finding of brain AV malformation after complaining of headache, her niece from the same brother suffered a seizure at age 43, MRI of the brain also find AVM,   Patient denies headaches, no gait abnormality, no bowel bladder incontinence, she enjoys hiking without any difficulties.  UPDATE Jun 18 2017: Her right neck, shoulder and upper extremity paresthesia pain has much improved, electrodiagnostic study today is normal, in specific, there is no evidence of right cervical  radiculopathy or right upper extremity neuropathy.  We have personally reviewed MRI of the brain with and without contrast on May 07, 2017, shows multiple foci in the brainstem, cerebellum and in the superficial and deep hemisphere with chronic heme products.  Given the patient's age, distribution of the foci and lack of cortical atrophy, this most likely represents multiple cavernoma syndrome, a genetic disorder.  She does have a strong positive family history, we also went over related information about Familia cavernoma syndrome, she has children at age 31 and 60, none of them has children yet.  She is interested in genetic counseling  REVIEW OF SYSTEMS: Full 14 system review of systems performed and notable only for as above  ALLERGIES: No Known Allergies  HOME MEDICATIONS: Current Outpatient Medications  Medication Sig Dispense Refill  . calcium carbonate (OS-CAL) 600 MG TABS Take 600 mg by mouth daily.     . ferrous sulfate 325 (65 FE) MG tablet Take 325 mg by mouth daily with breakfast.    . NEXIUM 40 MG capsule TAKE 1 CAPSULE BY MOUTH ONCE DAILY BEFORE BREAKFAST (Patient taking differently: Take 40 mg by mouth daily as needed. TAKE 1 CAPSULE BY MOUTH ONCE DAILY BEFORE BREAKFAST) 90 capsule 3  . Omega-3 Fatty Acids (FISH OIL PO) Take 1 capsule by mouth daily. Omega Red    . ranitidine (ZANTAC) 300 MG tablet TAKE 1 TALET BY MOUTH AT BEDTIME 90 tablet 2  . vitamin B-12 (CYANOCOBALAMIN) 1000 MCG tablet 1000 mcg daily for 7 days and then 1000 mcg weekly 14 tablet 0   No current facility-administered medications for this visit.     PAST MEDICAL HISTORY: Past Medical History:  Diagnosis Date  .  Ankle swelling    occasional   . Colon polyps 07/25/2010  . Complication of anesthesia    urinary retention  . DUB (dysfunctional uterine bleeding) 2016/2017   GYN did endometrial bx (BENIGN PATH) + pap 04/11/15; u/s to follow  . Edema   . Endometrial polyp   . GERD (gastroesophageal reflux  disease) 07/25/2010   with schatzki's ring on endo 07/2010  . History of recurrent UTIs 07/25/2010  . Melanoma ([place]) 2006   on right arm  . Miscarriage   . OSA on CPAP 10/02/2011  . Premenstrual migraine    headaches since bleeding started  . Schatzki's ring   . Seasonal allergic rhinitis   . SI (sacroiliac) joint inflammation ([place])   . Tachycardia    H/O transient  tach. as a chlid   . Umbilical hernia 0/0/9381  . Unspecified constipation 07/19/2012    PAST SURGICAL HISTORY: Past Surgical History:  Procedure Laterality Date  . COLONOSCOPY  02/2010   5 yr recall (FH colon cancer)  . DILATATION & CURRETTAGE/HYSTEROSCOPY WITH RESECTOCOPE N/A 04/21/2015   Path: benign. Procedure: [place];  Surgeon: Eldred Manges, MD;  Location: [facility];  Service: Gynecology;  Laterality: N/A;  . DILATION AND CURETTAGE OF UTERUS  [phone]  . DILATION AND EVACUATION N/A 04/21/2015   Done for DUB/Menorrhagia.  Procedure: DILATATION AND EVACUATION;  Surgeon: Eldred Manges, MD;  Location: [facility];  Service: Gynecology;  Laterality: N/A;  . EXPLORATORY LAPAROTOMY    . HERNIA REPAIR     left inguinal  . MELANOMA EXCISION  2006   R arm  . oral surgeries  70's and 80's  . TONSILLECTOMY     childhood  . VENTRAL HERNIA REPAIR N/A 10/29/2012   Procedure: LAPAROSCOPIC VENTRAL HERNIA WITH MESH;  Surgeon: Shann Medal, MD;  Location: [facility];  Service: General;  Laterality: N/A;    FAMILY HISTORY: Family History  Problem Relation Age of Onset  . Hyperlipidemia Mother   . Hypertension Mother   . Stroke Mother   . Cancer Father 63       ish, colon- remission/ skin cancer  . Other Brother        stent placed//AVM  . Hypertension Brother   . Hyperlipidemia Brother   . Heart disease Brother        2 cardiac stents  . Other Daughter        heart ablation/ heart arrythmia  . ADD / ADHD Daughter        ADD  . Heart disease Daughter 18       tachyarrythmia,  scheduled for ablation   . ADD / ADHD Son        ADD  . Cancer Paternal Uncle        colon  . Diabetes Maternal Grandmother   . Stroke Maternal Grandmother   . Hypertension Maternal Grandmother   . Alzheimer's disease Maternal Grandmother   . Diabetes Maternal Grandfather   . Other Maternal Grandfather        cardiac disease  . Cancer Paternal Grandmother        colon  . Allergy (severe) Brother        smoker  . Other Brother        respiratory problems    SOCIAL HISTORY:  Social History   Socioeconomic History  . Marital status: Married    Spouse name: Not on file  . Number of children: 2  . Years of  education: Masters  . Highest education level: Not on file  Occupational History  . Occupation: Mudlogger of ICU  Social Needs  . Financial resource strain: Not on file  . Food insecurity:    Worry: Not on file    Inability: Not on file  . Transportation needs:    Medical: Not on file    Non-medical: Not on file  Tobacco Use  . Smoking status: Never Smoker  . Smokeless tobacco: Never Used  Substance and Sexual Activity  . Alcohol use: Yes    Alcohol/week: 0.0 oz    Comment: socially  . Drug use: No  . Sexual activity: Yes    Partners: Male    Comment: no dietary restrictions  Lifestyle  . Physical activity:    Days per week: Not on file    Minutes per session: Not on file  . Stress: Not on file  Relationships  . Social connections:    Talks on phone: Not on file    Gets together: Not on file    Attends religious service: Not on file    Active member of club or organization: Not on file    Attends meetings of clubs or organizations: Not on file    Relationship status: Not on file  . Intimate partner violence:    Fear of current or ex partner: Not on file    Emotionally abused: Not on file    Physically abused: Not on file    Forced sexual activity: Not on file  Other Topics Concern  . Not on file  Social History Narrative   Married. Right handed.    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    Wears seatbelt. Smoke detector in the home.    Caffeine use: 1 cup coffee per day.      PHYSICAL EXAM   There were no vitals filed for this visit.  Not recorded      There is no height or weight on file to calculate BMI.  PHYSICAL EXAMNIATION:  Gen: NAD, conversant, well nourised, obese, well groomed                     Cardiovascular: Regular rate rhythm, no peripheral edema, warm, nontender. Eyes: Conjunctivae clear without exudates or hemorrhage Neck: Supple, no carotid bruits. Pulmonary: Clear to auscultation bilaterally  Bilateral radial pulse was symmetric, there was no change by raising arm overhead, Musculoskeletal: Right clavicle is non-union to the right acromioclavicular joints, no tenderness, mild right shoulder droopiness,  NEUROLOGICAL EXAM:  MENTAL STATUS: Speech:    Speech is normal; fluent and spontaneous with normal comprehension.  Cognition:     Orientation to time, place and person     Normal recent and remote memory     Normal Attention span and concentration     Normal Language, naming, repeating,spontaneous speech     Fund of knowledge   CRANIAL NERVES: CN II: Visual fields are full to confrontation. Fundoscopic exam is normal with sharp discs and no vascular changes. Pupils are round equal and briskly reactive to light. CN III, IV, VI: extraocular movement are normal. No ptosis. CN V: Facial sensation is intact to pinprick in all 3 divisions bilaterally. Corneal responses are intact.  CN VII: Face is symmetric with normal eye closure and smile. CN VIII: Hearing is normal to rubbing fingers CN IX, X: Palate elevates symmetrically. Phonation is normal. CN XI: Head turning and shoulder shrug are intact CN XII: Tongue is midline with normal movements  and no atrophy.  MOTOR: There is no pronator drift of out-stretched arms. Muscle bulk and tone are normal. Muscle strength is normal.  REFLEXES: Reflexes are 2+ and  symmetric at the biceps, triceps, knees, and ankles. Plantar responses are flexor.  SENSORY: Intact to light touch, pinprick, positional sensation and vibratory sensation are intact in fingers and toes.  COORDINATION: Rapid alternating movements and fine finger movements are intact. There is no dysmetria on finger-to-nose and heel-knee-shin.    GAIT/STANCE: Posture is normal. Gait is steady with normal steps, base, arm swing, and turning. Heel and toe walking are normal. Tandem gait is normal.  Romberg is absent.   DIAGNOSTIC DATA (LABS, IMAGING, TESTING) - I reviewed patient records, labs, notes, testing and imaging myself where available.   ASSESSMENT AND PLAN  Jennifer Sharp is a 57 y.o. female   Familiar cavernoma syndrome  She was seen by Dr. Kathyrn Sheriff, not a surgical candidate, will be referred to a geneticist for genetic counseling  We also went over related information, she is advised to call the office for new neurological symptoms, avoid NSAIDs, strenuous activity.  Right neck pain, radiating pain to right arm, anesthesia,  Most likely musculoskeletal etiology  Continue moderate exercise,     Marcial Pacas, M.D. Ph.D.  [facility] [address] [place], [place] [address] Ph: [phone] Fax: [phone]  [id number], Reinaldo Raddle, DO, Waynetta Sandy, MD

## 2017-06-20 NOTE — Procedures (Signed)
Full Name: Jennifer Sharp Gender: Female MRN #: [id number] Date of Birth: [birth year]    Visit Date: 06/18/17 08:18 Age: 57 Years 0 Months Old Examining Physician: Krista Blue, MD Referring Physician: Krista Blue, MD History: 57 year old female, with history of familial cavernoma syndrome, incidental finding of left pontine cavernoma, presented with right neck pain, radiating pain to right shoulder and upper extremity.  Summary of the tests: Nerve conduction study: Right median sensory and motor responses were normal.  Bilateral radial sensory, bilateral ulnar sensory and motor responses were normal.  Electromyography: Selective needle examination of right upper extremity muscles were normal.    Conclusion: This is a normal study.  There is no electrodiagnostic evidence of right upper extremity neuropathy or right cervical radiculopathy.    ------------------------------- Physician Name, M.D.  [facility] [place], [place] [address] Tel: [phone] Fax: [phone]        [facility]    Nerve / Sites Muscle Latency Ref. Amplitude Ref. Rel Amp Segments Distance Velocity Ref. Area    ms ms mV mV %  cm m/s m/s mVms  R Median - APB     Wrist APB 3.5 ?4.4 9.7 ?4.0 100 Wrist - APB 7   43.6     Upper arm APB 7.4  9.7  99.6 Upper arm - Wrist 22 56 ?49 42.3  L Median - APB     Wrist APB 3.3 ?4.4 9.4 ?4.0 100 Wrist - APB 7   40.2     Upper arm APB 7.1  9.3  98.9 Upper arm - Wrist 22 58 ?49 39.2  R Ulnar - ADM     Wrist ADM 2.6 ?3.3 7.7 ?6.0 100 Wrist - ADM 7   36.6     B.Elbow ADM 6.3  7.4  95.7 B.Elbow - Wrist 19 52 ?49 34.5     A.Elbow ADM 8.1  7.3  99.6 A.Elbow - B.Elbow 10 55 ?49 35.2         A.Elbow - Wrist      L Ulnar - ADM     Wrist ADM 2.8 ?3.3 6.8 ?6.0 100 Wrist - ADM 7   29.6     B.Elbow ADM 6.1  6.6  97 B.Elbow - Wrist 19 58 ?49 31.0     A.Elbow ADM 7.8  7.1  107 A.Elbow - B.Elbow 10 60 ?49 32.9         A.Elbow - Wrist                 SNC      Nerve / Sites Rec. Site Peak Lat Ref.  Amp Ref. Segments Distance Peak Diff Ref.    ms ms V V  cm ms ms  R Radial - Anatomical snuff box (Forearm)     Forearm Wrist 2.6 ?2.9 22 ?15 Forearm - Wrist 10    L Radial - Anatomical snuff box (Forearm)     Forearm Wrist 2.6 ?2.9 32 ?15 Forearm - Wrist 10    R Median, Ulnar - Transcarpal comparison     Median Palm Wrist 2.2 ?2.2 43 ?35 Median Palm - Wrist 8       Ulnar Palm Wrist 2.1 ?2.2 13 ?12 Ulnar Palm - Wrist 8          Median Palm - Ulnar Palm  0.1 ?0.4  L Median, Ulnar - Transcarpal comparison     Median Palm Wrist 2.1 ?2.2 57 ?35 Median Palm - Wrist 8  Ulnar Palm Wrist 1.9 ?2.2 16 ?12 Ulnar Palm - Wrist 8          Median Palm - Ulnar Palm  0.3 ?0.4  R Median - Orthodromic (Dig II, Mid palm)     Dig II Wrist 3.3 ?3.4 18 ?10 Dig II - Wrist 13    L Median - Orthodromic (Dig II, Mid palm)     Dig II Wrist 3.0 ?3.4 18 ?10 Dig II - Wrist 13    R Ulnar - Orthodromic, (Dig V, Mid palm)     Dig V Wrist 2.9 ?3.1 9 ?5 Dig V - Wrist 11    L Ulnar - Orthodromic, (Dig V, Mid palm)     Dig V Wrist 2.7 ?3.1 14 ?5 Dig V - Wrist 64                       F  Wave    Nerve F Lat Ref.   ms ms  R Ulnar - ADM 28.3 ?32.0  L Ulnar - ADM 28.0 ?32.0         EMG full       EMG Summary Table    Spontaneous MUAP Recruitment  Muscle IA Fib PSW Fasc Other Amp Dur. Poly Pattern  R. Extensor digitorum communis Normal None None None _______ Normal Normal Normal Normal  R. First dorsal interosseous Normal None None None _______ Normal Normal Normal Normal  R. Biceps brachii Normal None None None _______ Normal Normal Normal Normal  R. Deltoid Normal None None None _______ Normal Normal Normal Normal  R. Pronator teres Normal None None None _______ Normal Normal Normal Normal  R. Flexor carpi ulnaris Normal None None None _______ Normal Normal Normal Normal

## 2017-06-23 DIAGNOSIS — G4733 Obstructive sleep apnea (adult) (pediatric): Secondary | ICD-10-CM | POA: Diagnosis not present

## 2017-07-05 DIAGNOSIS — G4733 Obstructive sleep apnea (adult) (pediatric): Secondary | ICD-10-CM | POA: Diagnosis not present

## 2017-07-28 ENCOUNTER — Ambulatory Visit: Payer: 59 | Admitting: Pulmonary Disease

## 2017-07-28 ENCOUNTER — Encounter: Payer: Self-pay | Admitting: Pulmonary Disease

## 2017-07-28 VITALS — BP 110/78 | HR 76 | Ht 66.0 in | Wt 170.0 lb

## 2017-07-28 DIAGNOSIS — Z9989 Dependence on other enabling machines and devices: Secondary | ICD-10-CM | POA: Diagnosis not present

## 2017-07-28 DIAGNOSIS — G4733 Obstructive sleep apnea (adult) (pediatric): Secondary | ICD-10-CM | POA: Diagnosis not present

## 2017-07-28 NOTE — Progress Notes (Signed)
[facility]  Chief Complaint  Patient presents with  . Follow-up    Pt is doing well overall with new cpap machine.    Vital signs: BP 110/78 (BP Location: Left Arm, Cuff Size: Normal)   Pulse 76   Ht 5\' 6"  (1.676 m)   Wt 170 lb (77.1 kg)   SpO2 98%   BMI 27.44 kg/m   History of Present Illness: Jennifer Sharp is a 57 y.o. female with obstructive sleep apnea.  She has been doing well with CPAP.  Has nasal pillows.  Occasional mask leak but not bad.  Saw neurosurgery and neurology.  Gets funny feeling in head sometimes.  Has trouble getting enough hours of sleep.  Physical Exam:  General - pleasant Eyes - pupils reactive ENT - no sinus tenderness, no oral exudate, no LAN Cardiac - regular, no murmur Chest - no wheeze, rales Abd - soft, non tender Ext - no edema Skin - no rashes Neuro - normal strength Psych - normal mood   Assessment/Plan:  Obstructive sleep apnea. - she is compliant with CPAP and reports benefit - continue auto CPAP   Patient Instructions  Follow up in 1 year    Chesley Mires, MD [place] 07/28/2017, 9:28 AM Pager:  [phone]  Flow Sheet  Sleep tests: HST 06/24/11 >> AHI 19.7, SaO2 low 75% Auto CPAP 06/28/17 to 07/27/17 >> used on 30 of 30 nights with average 6 hrs 9 min.  Average AHI 0.7 with median CPAP 9 and 95 th percentile CPAP 12 cm H2O  Cardiac tests Echo 08/07/10 >> EF 55%  Past Medical History: She  has a past medical history of Ankle swelling, Colon polyps (0/10/6281), Complication of anesthesia, DUB (dysfunctional uterine bleeding) (2016/2017), Edema, Endometrial polyp, GERD (gastroesophageal reflux disease) (07/25/2010), History of recurrent UTIs (07/25/2010), Melanoma ([place]) (2006), Miscarriage, OSA on CPAP (10/02/2011), Premenstrual migraine, Schatzki's ring, Seasonal allergic rhinitis, SI (sacroiliac) joint inflammation ([place]), Tachycardia, Umbilical hernia (07/24/2945),  and Unspecified constipation (07/19/2012).  Past Surgical History: She  has a past surgical history that includes oral surgeries (70's and 80's); Dilation and curettage of uterus (65,46,50,36); Melanoma excision (2006); Tonsillectomy; Hernia repair; Exploratory laparotomy; Ventral hernia repair (N/A, 10/29/2012); Colonoscopy (02/2010); Dilatation & currettage/hysteroscopy with resectoscope (N/A, 04/21/2015); and Dilation and evacuation (N/A, 04/21/2015).  Family History: Her family history includes ADD / ADHD in her daughter and son; Allergy (severe) in her brother; Alzheimer's disease in her maternal grandmother; Cancer in her paternal grandmother and paternal uncle; Cancer (age of onset: 2) in her father; Diabetes in her maternal grandfather and maternal grandmother; Heart disease in her brother; Heart disease (age of onset: 48) in her daughter; Hyperlipidemia in her brother and mother; Hypertension in her brother, maternal grandmother, and mother; Other in her brother, brother, daughter, and maternal grandfather; Stroke in her maternal grandmother and mother.  Social History: She  reports that she has never smoked. She has never used smokeless tobacco. She reports that she drinks alcohol. She reports that she does not use drugs.  Medications: Allergies as of 07/28/2017   No Known Allergies     Medication List        Accurate as of 07/28/17  9:28 AM. Always use your most recent med list.          calcium carbonate 600 MG Tabs tablet Commonly known as:  OS-CAL Take 600 mg by mouth daily.   ferrous sulfate 325 (65 FE) MG tablet Take 325 mg  by mouth daily with breakfast.   FISH OIL PO Take 1 capsule by mouth daily. Omega Red   NEXIUM 40 MG capsule Generic drug:  esomeprazole TAKE 1 CAPSULE BY MOUTH ONCE DAILY BEFORE BREAKFAST   ranitidine 300 MG tablet Commonly known as:  ZANTAC TAKE 1 TALET BY MOUTH AT BEDTIME   vitamin B-12 1000 MCG tablet Commonly known as:  CYANOCOBALAMIN 1000  mcg daily for 7 days and then 1000 mcg weekly

## 2017-07-28 NOTE — Patient Instructions (Signed)
Follow up in 1 year.

## 2017-07-29 ENCOUNTER — Other Ambulatory Visit: Payer: Self-pay | Admitting: Family Medicine

## 2017-08-05 DIAGNOSIS — G4733 Obstructive sleep apnea (adult) (pediatric): Secondary | ICD-10-CM | POA: Diagnosis not present

## 2017-08-13 ENCOUNTER — Encounter: Payer: Self-pay | Admitting: Family Medicine

## 2017-08-13 ENCOUNTER — Ambulatory Visit (INDEPENDENT_AMBULATORY_CARE_PROVIDER_SITE_OTHER): Payer: 59 | Admitting: Family Medicine

## 2017-08-13 ENCOUNTER — Telehealth: Payer: Self-pay | Admitting: Family Medicine

## 2017-08-13 VITALS — BP 118/82 | HR 71 | Temp 97.4°F | Resp 20 | Ht 66.0 in | Wt 171.0 lb

## 2017-08-13 DIAGNOSIS — E7849 Other hyperlipidemia: Secondary | ICD-10-CM | POA: Diagnosis not present

## 2017-08-13 DIAGNOSIS — Q283 Other malformations of cerebral vessels: Secondary | ICD-10-CM | POA: Diagnosis not present

## 2017-08-13 DIAGNOSIS — Z0000 Encounter for general adult medical examination without abnormal findings: Secondary | ICD-10-CM | POA: Diagnosis not present

## 2017-08-13 DIAGNOSIS — Z85820 Personal history of malignant melanoma of skin: Secondary | ICD-10-CM | POA: Diagnosis not present

## 2017-08-13 DIAGNOSIS — E663 Overweight: Secondary | ICD-10-CM

## 2017-08-13 DIAGNOSIS — Z862 Personal history of diseases of the blood and blood-forming organs and certain disorders involving the immune mechanism: Secondary | ICD-10-CM | POA: Diagnosis not present

## 2017-08-13 DIAGNOSIS — K635 Polyp of colon: Secondary | ICD-10-CM | POA: Diagnosis not present

## 2017-08-13 DIAGNOSIS — E538 Deficiency of other specified B group vitamins: Secondary | ICD-10-CM

## 2017-08-13 DIAGNOSIS — R7309 Other abnormal glucose: Secondary | ICD-10-CM

## 2017-08-13 DIAGNOSIS — H524 Presbyopia: Secondary | ICD-10-CM | POA: Diagnosis not present

## 2017-08-13 DIAGNOSIS — Z79899 Other long term (current) drug therapy: Secondary | ICD-10-CM

## 2017-08-13 DIAGNOSIS — K219 Gastro-esophageal reflux disease without esophagitis: Secondary | ICD-10-CM

## 2017-08-13 DIAGNOSIS — H11441 Conjunctival cysts, right eye: Secondary | ICD-10-CM | POA: Diagnosis not present

## 2017-08-13 DIAGNOSIS — H5211 Myopia, right eye: Secondary | ICD-10-CM | POA: Diagnosis not present

## 2017-08-13 DIAGNOSIS — H5202 Hypermetropia, left eye: Secondary | ICD-10-CM | POA: Diagnosis not present

## 2017-08-13 DIAGNOSIS — H52223 Regular astigmatism, bilateral: Secondary | ICD-10-CM | POA: Diagnosis not present

## 2017-08-13 DIAGNOSIS — R7303 Prediabetes: Secondary | ICD-10-CM

## 2017-08-13 DIAGNOSIS — H2513 Age-related nuclear cataract, bilateral: Secondary | ICD-10-CM | POA: Diagnosis not present

## 2017-08-13 LAB — COMPREHENSIVE METABOLIC PANEL
ALBUMIN: 4.5 g/dL (ref 3.5–5.2)
ALK PHOS: 66 U/L (ref 39–117)
ALT: 15 U/L (ref 0–35)
AST: 15 U/L (ref 0–37)
BUN: 12 mg/dL (ref 6–23)
CALCIUM: 9.5 mg/dL (ref 8.4–10.5)
CHLORIDE: 104 meq/L (ref 96–112)
CO2: 32 mEq/L (ref 19–32)
Creatinine, Ser: 0.75 mg/dL (ref 0.40–1.20)
GFR: 84.91 mL/min (ref >60.00)
Glucose, Bld: 109 mg/dL — ABNORMAL HIGH (ref 70–99)
POTASSIUM: 4.3 meq/L (ref 3.5–5.1)
Sodium: 141 mEq/L (ref 135–145)
Total Bilirubin: 0.7 mg/dL (ref 0.2–1.2)
Total Protein: 6.7 g/dL (ref 6.0–8.3)

## 2017-08-13 LAB — CBC WITH DIFFERENTIAL/PLATELET
BASOS PCT: 0.7 % (ref 0.0–3.0)
Basophils Absolute: 0 10*3/uL (ref 0.0–0.1)
EOS PCT: 2 % (ref 0.0–5.0)
Eosinophils Absolute: 0.1 10*3/uL (ref 0.0–0.7)
HEMATOCRIT: 44.9 % (ref 36.0–46.0)
HEMOGLOBIN: 15 g/dL (ref 12.0–15.0)
LYMPHS PCT: 32.2 % (ref 12.0–46.0)
Lymphs Abs: 2.3 10*3/uL (ref 0.7–4.0)
MCHC: 33.5 g/dL (ref 30.0–36.0)
MCV: 93.6 fl (ref 78.0–100.0)
MONO ABS: 0.5 10*3/uL (ref 0.1–1.0)
MONOS PCT: 7 % (ref 3.0–12.0)
Neutro Abs: 4.1 10*3/uL (ref 1.4–7.7)
Neutrophils Relative %: 58.1 % (ref 43.0–77.0)
Platelets: 252 10*3/uL (ref 150.0–400.0)
RBC: 4.79 Mil/uL (ref 3.87–5.11)
RDW: 13.2 % (ref 11.5–15.5)
WBC: 7 10*3/uL (ref 4.0–10.5)

## 2017-08-13 LAB — LIPID PANEL
CHOLESTEROL: 167 mg/dL (ref 0–200)
HDL: 60.6 mg/dL (ref >39.00)
LDL Cholesterol: 84 mg/dL (ref 0–99)
NONHDL: 106.52
Total CHOL/HDL Ratio: 3
Triglycerides: 111 mg/dL (ref 0.0–149.0)
VLDL: 22.2 mg/dL (ref 0.0–40.0)

## 2017-08-13 LAB — HEMOGLOBIN A1C: HEMOGLOBIN A1C: 6.1 % (ref 4.6–6.5)

## 2017-08-13 LAB — VITAMIN B12: Vitamin B-12: 515 pg/mL (ref 211–911)

## 2017-08-13 LAB — TSH: TSH: 2.07 u[IU]/mL (ref 0.35–4.50)

## 2017-08-13 MED ORDER — NEXIUM 40 MG PO CPDR: DELAYED_RELEASE_CAPSULE | ORAL | 3 refills | Status: DC

## 2017-08-13 MED ORDER — RANITIDINE HCL 300 MG PO TABS: ORAL_TABLET | ORAL | 3 refills | Status: DC

## 2017-08-13 MED ORDER — ZOSTER VAC RECOMB ADJUVANTED 50 MCG/0.5ML IM SUSR: 0.5000 mL | Freq: Once | INTRAMUSCULAR | 1 refills | Status: AC

## 2017-08-13 MED FILL — raNITIdine HCL 300 MG TABS: 300 | 90 days supply | Qty: 90 | Fill #0

## 2017-08-13 NOTE — Patient Instructions (Signed)

## 2017-08-13 NOTE — Progress Notes (Signed)
Patient ID: Jennifer Sharp, female  DOB: 07-07-1960, 57 y.o.   MRN: [id number] Patient Care Team    Relationship Specialty Notifications Start End  Ma Hillock, DO PCP - General Family Medicine  09/13/15   Earnstine Regal, PA-C Physician Assistant Obstetrics and Gynecology  04/04/15    Comment: [facility]  Haygood, Seymour Bars, MD Consulting Physician Obstetrics and Gynecology  04/13/15   Milus Banister, MD Attending Physician Gastroenterology  09/13/15   [place], Heather, [place]  Optometry  09/13/15   Waynetta Sandy, MD Consulting Physician Vascular Surgery  05/12/17     Chief Complaint  Patient presents with  . Annual Exam    Subjective:  Jennifer Sharp is a 57 y.o.  Female  present for CPE . All past medical history, surgical history, allergies, family history, immunizations, medications and social history were updated in the electronic medical record today. All recent labs, ED visits and hospitalizations within the last year were reviewed.  Health maintenance: updated 08/13/17 Completed 11/28/2014, by Dr. Ardis Hughs, resutls normal. follow up 5 yr (Fhx). Personal history of polyps at 4 yr.  Mammogram: completed: 10/24/2016, birads 1. At gyn office, reported normal. Cervical cancer screening: last pap: 03/2015, results: normal, completed by: Dr. Leo Grosser (GYN). Uterine polyp removed 2017. Immunizations: tdap 08/2015 utd, Influenza UTD receives through work.(encouraged yearly).  Discussed Shingrix with her today and she is agreeable, currently we are out of stock, printed prescription for her. Infectious disease screening: hiv and hep c completed DEXA: 06/13/2016--> normal, Fhx osteoporosis, estrogen deficient. Long term use of nexium. Vit D normal.  Assistive device:None Oxygen HKV:QQVZ Patient has a Dental home. Hospitalizations/ED visits: reviewed   Depression screen [facility] 2/9 08/13/2017 05/31/2016 09/13/2015  Decreased Interest 0 0 0  Down, Depressed, Hopeless 0 0  0  PHQ - 2 Score 0 0 0   No flowsheet data found.   Current Exercise Habits: Home exercise routine, Time (Minutes): 30, Frequency (Times/Week): 3, Weekly Exercise (Minutes/Week): 90, Intensity: Mild Exercise limited by: None identified   Immunization History  Administered Date(s) Administered  . Influenza Split 10/28/2016  . Influenza Whole 11/19/2010  . Influenza-Unspecified 11/19/2014, 11/08/2016  . Tdap 09/13/2015     Past Medical History:  Diagnosis Date  . Ankle swelling    occasional   . Cavernoma 05/2017  . Colon polyps 07/25/2010  . Complication of anesthesia    urinary retention  . DUB (dysfunctional uterine bleeding) 2016/2017   GYN did endometrial bx (BENIGN PATH) + pap 04/11/15; u/s to follow  . Edema   . Endometrial polyp   . GERD (gastroesophageal reflux disease) 07/25/2010   with schatzki's ring on endo 07/2010  . History of recurrent UTIs 07/25/2010  . Melanoma ([place]) 2006   on right arm  . Miscarriage   . OSA on CPAP 10/02/2011  . Premenstrual migraine    headaches since bleeding started  . Schatzki's ring   . Seasonal allergic rhinitis   . SI (sacroiliac) joint inflammation ([place])   . Tachycardia    H/O transient  tach. as a chlid   . Umbilical hernia 06/23/3873  . Unspecified constipation 07/19/2012   Allergies  Allergen Reactions  . Anticoagulant Compound   . Asa [Aspirin]   . Nsaids    Past Surgical History:  Procedure Laterality Date  . COLONOSCOPY  02/2010   5 yr recall (FH colon cancer)  . DILATATION & CURRETTAGE/HYSTEROSCOPY WITH RESECTOCOPE N/A 04/21/2015   Path: benign. Procedure: DILATATION &  CURETTAGE/HYSTEROSCOPY WITH RESECTOCOPE;  Surgeon: Eldred Manges, MD;  Location: [facility];  Service: Gynecology;  Laterality: N/A;  . DILATION AND CURETTAGE OF UTERUS  [phone]  . DILATION AND EVACUATION N/A 04/21/2015   Done for DUB/Menorrhagia.  Procedure: DILATATION AND EVACUATION;  Surgeon: Eldred Manges, MD;  Location: [facility];  Service:  Gynecology;  Laterality: N/A;  . EXPLORATORY LAPAROTOMY    . HERNIA REPAIR     left inguinal  . MELANOMA EXCISION  2006   R arm  . oral surgeries  70's and 80's  . TONSILLECTOMY     childhood  . VENTRAL HERNIA REPAIR N/A 10/29/2012   Procedure: LAPAROSCOPIC VENTRAL HERNIA WITH MESH;  Surgeon: Shann Medal, MD;  Location: [facility];  Service: General;  Laterality: N/A;   Family History  Problem Relation Age of Onset  . Hyperlipidemia Mother   . Hypertension Mother   . Stroke Mother   . Cancer Father 38       ish, colon- remission/ skin cancer  . Other Brother        stent placed//AVM  . Hypertension Brother   . Hyperlipidemia Brother   . Heart disease Brother        2 cardiac stents  . Other Daughter        heart ablation/ heart arrythmia  . ADD / ADHD Daughter        ADD  . Heart disease Daughter 52       tachyarrythmia, scheduled for ablation   . ADD / ADHD Son        ADD  . Cancer Paternal Uncle        colon  . Diabetes Maternal Grandmother   . Stroke Maternal Grandmother   . Hypertension Maternal Grandmother   . Alzheimer's disease Maternal Grandmother   . Diabetes Maternal Grandfather   . Other Maternal Grandfather        cardiac disease  . Cancer Paternal Grandmother        colon  . Allergy (severe) Brother        smoker  . Other Brother        respiratory problems   Social History   Socioeconomic History  . Marital status: Married    Spouse name: Not on file  . Number of children: 2  . Years of education: Masters  . Highest education level: Not on file  Occupational History  . Occupation: Mudlogger of ICU  Social Needs  . Financial resource strain: Not on file  . Food insecurity:    Worry: Not on file    Inability: Not on file  . Transportation needs:    Medical: Not on file    Non-medical: Not on file  Tobacco Use  . Smoking status: Never Smoker  . Smokeless tobacco: Never Used  Substance and Sexual Activity  . Alcohol use: Yes     Alcohol/week: 0.0 oz    Comment: socially  . Drug use: No  . Sexual activity: Yes    Partners: Male    Comment: no dietary restrictions  Lifestyle  . Physical activity:    Days per week: Not on file    Minutes per session: Not on file  . Stress: Not on file  Relationships  . Social connections:    Talks on phone: Not on file    Gets together: Not on file    Attends religious service: Not on file    Active member of club or organization: Not on  file    Attends meetings of clubs or organizations: Not on file    Relationship status: Not on file  . Intimate partner violence:    Fear of current or ex partner: Not on file    Emotionally abused: Not on file    Physically abused: Not on file    Forced sexual activity: Not on file  Other Topics Concern  . Not on file  Social History Narrative   Married. Right handed.   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    Wears seatbelt. Smoke detector in the home.    Caffeine use: 1 cup coffee per day.    Allergies as of 08/13/2017      Reactions   Anticoagulant Compound    Asa [aspirin]    Nsaids       Medication List        Accurate as of 08/13/17  5:02 PM. Always use your most recent med list.          calcium carbonate 600 MG Tabs tablet Commonly known as:  OS-CAL Take 600 mg by mouth daily.   ferrous sulfate 325 (65 FE) MG tablet Take 325 mg by mouth daily with breakfast.   FISH OIL PO Take 1 capsule by mouth daily. Omega Red   NEXIUM 40 MG capsule Generic drug:  esomeprazole TAKE 1 CAPSULE BY MOUTH ONCE DAILY BEFORE BREAKFAST   ranitidine 300 MG tablet Commonly known as:  ZANTAC TAKE 1 TALET BY MOUTH AT BEDTIME   vitamin B-12 1000 MCG tablet Commonly known as:  CYANOCOBALAMIN 1000 mcg daily for 7 days and then 1000 mcg weekly   Zoster Vaccine Adjuvanted injection Commonly known as:  SHINGRIX Inject 0.5 mLs into the muscle once for 1 dose. Repeat dose in 2-6 months once.       All past medical history, surgical  history, allergies, family history, immunizations andmedications were updated in the EMR today and reviewed under the history and medication portions of their EMR.     Recent Results (from the past 2160 hour(s))  CBC w/Diff     Status: None   Collection Time: 08/13/17  9:41 AM  Result Value Ref Range   WBC 7.0 4.0 - 10.5 K/uL   RBC 4.79 3.87 - 5.11 Mil/uL   Hemoglobin 15.0 12.0 - 15.0 g/dL   HCT 44.9 36.0 - 46.0 %   MCV 93.6 78.0 - 100.0 fl   MCHC 33.5 30.0 - 36.0 g/dL   RDW 13.2 11.5 - 15.5 %   Platelets 252.0 150.0 - 400.0 K/uL   Neutrophils Relative % 58.1 43.0 - 77.0 %   Lymphocytes Relative 32.2 12.0 - 46.0 %   Monocytes Relative 7.0 3.0 - 12.0 %   Eosinophils Relative 2.0 0.0 - 5.0 %   Basophils Relative 0.7 0.0 - 3.0 %   Neutro Abs 4.1 1.4 - 7.7 K/uL   Lymphs Abs 2.3 0.7 - 4.0 K/uL   Monocytes Absolute 0.5 0.1 - 1.0 K/uL   Eosinophils Absolute 0.1 0.0 - 0.7 K/uL   Basophils Absolute 0.0 0.0 - 0.1 K/uL  Comp Met (CMET)     Status: Abnormal   Collection Time: 08/13/17  9:41 AM  Result Value Ref Range   Sodium 141 135 - 145 mEq/L   Potassium 4.3 3.5 - 5.1 mEq/L   Chloride 104 96 - 112 mEq/L   CO2 32 19 - 32 mEq/L   Glucose, Bld 109 (H) 70 - 99 mg/dL   BUN 12 6 -  23 mg/dL   Creatinine, Ser 0.75 0.40 - 1.20 mg/dL   Total Bilirubin 0.7 0.2 - 1.2 mg/dL   Alkaline Phosphatase 66 39 - 117 U/L   AST 15 0 - 37 U/L   ALT 15 0 - 35 U/L   Total Protein 6.7 6.0 - 8.3 g/dL   Albumin 4.5 3.5 - 5.2 g/dL   Calcium 9.5 8.4 - 10.5 mg/dL   GFR 84.91 >60.00 mL/min  HgB A1c     Status: None   Collection Time: 08/13/17  9:41 AM  Result Value Ref Range   Hgb A1c MFr Bld 6.1 4.6 - 6.5 %    Comment: Glycemic Control Guidelines for People with Diabetes:Non Diabetic:  <6%Goal of Therapy: <7%Additional Action Suggested:  >8%   Lipid panel     Status: None   Collection Time: 08/13/17  9:41 AM  Result Value Ref Range   Cholesterol 167 0 - 200 mg/dL    Comment: ATP III Classification        Desirable:  < 200 mg/dL               Borderline High:  200 - 239 mg/dL          High:  > = 240 mg/dL   Triglycerides 111.0 0.0 - 149.0 mg/dL    Comment: Normal:  <150 mg/dLBorderline High:  150 - 199 mg/dL   HDL 60.60 >39.00 mg/dL   VLDL 22.2 0.0 - 40.0 mg/dL   LDL Cholesterol 84 0 - 99 mg/dL   Total CHOL/HDL Ratio 3     Comment:                Men          Women1/2 Average Risk     3.4          3.3Average Risk          5.0          4.42X Average Risk          9.6          7.13X Average Risk          15.0          11.0                       NonHDL 106.52     Comment: NOTE:  Non-HDL goal should be 30 mg/dL higher than patient's LDL goal (i.e. LDL goal of < 70 mg/dL, would have non-HDL goal of < 100 mg/dL)  TSH     Status: None   Collection Time: 08/13/17  9:41 AM  Result Value Ref Range   TSH 2.07 0.35 - 4.50 uIU/mL  B12     Status: None   Collection Time: 08/13/17  9:41 AM  Result Value Ref Range   Vitamin B-12 515 211 - 911 pg/mL    Mr Jeri Cos RF Contrast  Result Date: 05/17/2017  [facility] [address], [place], [place] [address] [phone] NEUROIMAGING REPORT STUDY DATE: 05/17/2017 PATIENT NAME: DANGELA HOW DOB: [birth year] MRN: [id number] EXAM: MRI Brain with and without contrast ORDERING CLINICIAN: Marcial Pacas MD, PhD CLINICAL HISTORY: 57 year old woman with vascular malformations COMPARISON FILMS: None TECHNIQUE:MRI of the brain with and without contrast was obtained utilizing 5 mm axial slices with T1, T2, T2 flair, SWI and diffusion weighted views.  T1 sagittal, T2 coronal and postcontrast views in the axial and coronal plane were obtained.  CONTRAST: 15 ml [employer] IMAGING SITE: [facility], [place]. FINDINGS: On sagittal images, the spinal cord is imaged caudally to C3 and is normal in caliber.   The contents of the posterior fossa are of normal size and position.   The pituitary gland and optic chiasm appear normal.    Brain volume  appears normal.   The ventricles are normal in size and without distortion.  There are no abnormal extra-axial collections of fluid.  There is a large focus in the left upper pons measuring 14-15 mm  that is hypointense on susceptibility weighted images with heterogenous signal on both T1 and T2-weighted images and without adjacent mass-effect.    There are numerous other lesions varying in size from punctate to 8 mm in the cerebellum, brainstem and dispersed throughout the hemispheres both superficially and deep.  These foci are hypointense on susceptibility weighted images and some are hypointense on T1 and T2-weighted images consistent with hemosiderin.  There is no enhancement after contrast is administered. The orbits appear normal.   The VIIth/VIIIth nerve complex appears normal.  The mastoid air cells appear normal.  The paranasal sinuses appear normal.  Flow voids are identified within the major intracerebral arteries.  After the infusion of contrast material, a normal enhancement pattern is noted.   This MRI of the brain with and without contrast shows multiple foci in the brainstem, cerebellum and in the superficial and deep hemisphere with chronic heme products.  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 Felecia Shelling, MD, PhD, FAAN Certified in  Neuroimaging by [employer]     ROS: 14 pt review of systems performed and negative (unless mentioned in an HPI)  Objective: BP 118/82 (BP Location: Right Arm, Patient Position: Sitting, Cuff Size: Normal)   Pulse 71   Temp (!) 97.4 F (36.3 C)   Resp 20   Ht 5' 6"  (1.676 m)   Wt 171 lb (77.6 kg)   SpO2 100%   BMI 27.60 kg/m  Gen: Afebrile. No acute distress. Nontoxic in appearance, well-developed, well-nourished,  pleasant, Caucasian female. HENT: AT. [place]. Bilateral TM visualized and normal in appearance, normal external auditory canal. MMM, no oral lesions, adequate dentition. Bilateral nares within normal limits. Throat without erythema, ulcerations or exudates.  No cough on exam, no hoarseness on exam. Eyes:Pupils Equal Round Reactive to light, Extraocular movements intact,  Conjunctiva without redness, discharge or icterus. Neck/lymp/endocrine: Supple, no lymphadenopathy, no thyromegaly CV: RRR no murmur, no edema, +2/4 P posterior tibialis pulses.  No carotid bruits. No JVD. Chest: CTAB, no wheeze, rhonchi or crackles.  Normal respiratory effort.  Good air movement. Abd: Soft.  Flat. NTND. BS present.  No masses palpated. No hepatosplenomegaly. No rebound tenderness or guarding. Skin: No rashes, purpura or petechiae. Warm and well-perfused. Skin intact. Neuro/Msk:  Normal gait. PERLA. EOMi. Alert. Oriented x3.  Cranial nerves II through XII intact. Muscle strength 5/5 upper/lower extremity. DTRs equal bilaterally. Psych: Normal affect, dress and demeanor. Normal speech. Normal thought content and judgment.   No exam data present  Assessment/plan: Jennifer Sharp is a 57 y.o. female present for CPE. Elevated hemoglobin A1c/overweight -Diet and exercise modifications discussed - HgB A1c - TSH hyperlipidemia - Lipid panel - TSH Encounter for long-term (current) use of medications - Comp Met (CMET) B12 deficiency/GERD -Refilled  ranitidine and Nexium  - B12 History of anemia - CBC w/Diff - TSH Malignant melanoma history: Continue routine skin checks with dermatology Polyp of colon, unspecified part of colon, unspecified type Routine follow-up with GI due 2021 Brain vascular malformation We will routinely with neurology and neurosurgery. Visit for preventive health examination Patient was encouraged to exercise greater than 150 minutes a week. Patient was encouraged to choose a diet filled  with fresh fruits and vegetables, and lean meats. AVS provided to patient today for education/recommendation on gender specific health and safety maintenance. Completed 11/28/2014, by Dr. Ardis Hughs, follow up 5 yr due 2021.  Mammogram: Up-to-date, gets completed yearly at gynecology.  Cervical cancer screening: Up-to-date has completed yearly at gynecology.   Immunizations: tdap 08/2015 utd, Influenza UTD(encouraged yearly).  Shingrix prescription printed today. Infectious disease screening: Completed DEXA: 06/13/2016--> normal.   Return in about 1 year (around 08/14/2018) for CPE.  Electronically signed by: Howard Pouch, DO [place]

## 2017-08-13 NOTE — Telephone Encounter (Signed)
Please inform patient the following information: All of her labs are normal with the exception of her diabetes screening.  Her fasting glucose is mildly elevated at 109, and her A1c has increased to 6.1.  This puts her in the prediabetic range.  No medications are required at this time.  However she should try to safely increase her daily exercise, decrease the sugar and carbohydrate content in her diet.  Using vegetables as her carbohydrate source. -If she would like a referral to nutrition for prediabetes I would be happy to place that for her. -Cholesterol looks great, no need for medications at this time. -Follow-up in 6 months for repeat A1c secondary to prediabetes

## 2017-08-14 NOTE — Telephone Encounter (Signed)
Spoke with patient reviewed lab results and instructions. Patient verbalized understanding. 

## 2017-08-18 MED FILL — ESOMEPRAZOLE MAG DR 40 MG C: 40 | 90 days supply | Qty: 90 | Fill #0

## 2017-08-26 ENCOUNTER — Telehealth: Payer: 59 | Admitting: Physician Assistant

## 2017-08-26 DIAGNOSIS — N3000 Acute cystitis without hematuria: Secondary | ICD-10-CM | POA: Diagnosis not present

## 2017-08-26 MED ORDER — CEPHALEXIN 500 MG PO CAPS: 500.0000 mg | ORAL_CAPSULE | Freq: Two times a day (BID) | ORAL | 0 refills | Status: DC

## 2017-08-26 MED FILL — CEPHALEXIN 500 MG CAPSULE: 500 | 7 days supply | Qty: 14 | Fill #0

## 2017-08-26 NOTE — Progress Notes (Signed)

## 2017-09-04 DIAGNOSIS — G4733 Obstructive sleep apnea (adult) (pediatric): Secondary | ICD-10-CM | POA: Diagnosis not present

## 2017-12-15 DIAGNOSIS — G4733 Obstructive sleep apnea (adult) (pediatric): Secondary | ICD-10-CM | POA: Diagnosis not present

## 2017-12-16 ENCOUNTER — Ambulatory Visit (INDEPENDENT_AMBULATORY_CARE_PROVIDER_SITE_OTHER): Payer: 59

## 2017-12-16 DIAGNOSIS — Z23 Encounter for immunization: Secondary | ICD-10-CM | POA: Diagnosis not present

## 2017-12-16 NOTE — Progress Notes (Addendum)
Patient presents today for #1 Shingrix vaccine. Tolerated well and left with no complaints. Patient scheduled #2 vaccine in 2 months.   Medical screening examination/treatment/procedure(s) were performed by non-physician practitioner and as supervising physician I was immediately available for consultation/collaboration.  I agree with above assessment and plan.  Electronically Signed by: Howard Pouch, DO [place] [facility] [place]

## 2017-12-17 ENCOUNTER — Encounter: Payer: Self-pay | Admitting: Family Medicine

## 2017-12-17 DIAGNOSIS — Z1231 Encounter for screening mammogram for malignant neoplasm of breast: Secondary | ICD-10-CM | POA: Diagnosis not present

## 2017-12-17 DIAGNOSIS — Z01419 Encounter for gynecological examination (general) (routine) without abnormal findings: Secondary | ICD-10-CM | POA: Diagnosis not present

## 2017-12-17 DIAGNOSIS — Z6827 Body mass index (BMI) 27.0-27.9, adult: Secondary | ICD-10-CM | POA: Diagnosis not present

## 2017-12-17 DIAGNOSIS — Z124 Encounter for screening for malignant neoplasm of cervix: Secondary | ICD-10-CM | POA: Diagnosis not present

## 2017-12-17 LAB — HM PAP SMEAR

## 2017-12-17 LAB — HM MAMMOGRAPHY

## 2018-03-02 ENCOUNTER — Other Ambulatory Visit: Payer: Self-pay

## 2018-03-02 ENCOUNTER — Ambulatory Visit (INDEPENDENT_AMBULATORY_CARE_PROVIDER_SITE_OTHER): Payer: Self-pay | Admitting: Physician Assistant

## 2018-03-02 VITALS — BP 110/70 | HR 109 | Temp 98.5°F | Resp 18 | Wt 156.4 lb

## 2018-03-02 DIAGNOSIS — R6889 Other general symptoms and signs: Secondary | ICD-10-CM

## 2018-03-02 DIAGNOSIS — J101 Influenza due to other identified influenza virus with other respiratory manifestations: Secondary | ICD-10-CM

## 2018-03-02 LAB — POCT INFLUENZA A/B
Influenza A, POC: POSITIVE — AB
Influenza B, POC: NEGATIVE

## 2018-03-02 MED ORDER — PROMETHAZINE-DM 6.25-15 MG/5ML PO SYRP: 5.0000 mL | ORAL_SOLUTION | Freq: Four times a day (QID) | ORAL | 0 refills | Status: DC | PRN

## 2018-03-02 MED ORDER — ONDANSETRON 8 MG PO TBDP: 8.0000 mg | ORAL_TABLET | Freq: Three times a day (TID) | ORAL | 0 refills | Status: DC | PRN

## 2018-03-02 MED ORDER — OSELTAMIVIR PHOSPHATE 75 MG PO CAPS: 75.0000 mg | ORAL_CAPSULE | Freq: Two times a day (BID) | ORAL | 0 refills | Status: DC

## 2018-03-02 MED FILL — PROMETHAZINE W/DM SYRUP: 6.25-15 | 6 days supply | Qty: 118 | Fill #0

## 2018-03-02 MED FILL — ONDANSETRON ODT 8 MG TABLET: 8 | 6 days supply | Qty: 20 | Fill #0

## 2018-03-02 MED FILL — OSELTAMIVIR PHOSPHATE 75 MG: 75 | 5 days supply | Qty: 10 | Fill #0

## 2018-03-02 NOTE — Progress Notes (Signed)
MRN: [id number] DOB: 1960-04-24  Subjective:   Jennifer Sharp is a 58 y.o. female presenting for chief complaint of Sore Throat (X 2 DAYS ); Chills (X 1 DAY); Nausea (X 2 DAYS ); Generalized Body Aches (X 2 DAYS ); and Cough (X 1 DAY ) .  Reports 1 day history of sudden onset body aches, subjective fever, chills, dry cough, sore throat, and nasal congestion. Has associated nausea. Denies sinus pain, ear drainage, inability to swallow, voice change, productive cough, wheezing, shortness of breath and chest pain, vomiting, abdominal pain and diarrhea. Has tried tylenol with relief.  No known sick contact exposure but did just get back froma cruise yesterday. No other family members w/ similar sx. She also works as a Marine scientist in [employer]. No PMH of asthma, COPD, DM, HTN, or kidney disease. Had flu shot this season. Denies smoking. Denies any other aggravating or relieving factors, no other questions or concerns.  Review of Systems  Respiratory: Negative for hemoptysis and sputum production.   Cardiovascular: Negative for palpitations.  Gastrointestinal: Negative for blood in stool and constipation.  Skin: Negative for rash.  Neurological: Negative for dizziness and headaches.    Fatiha has a current medication list which includes the following prescription(s): acetaminophen, calcium carbonate, cephalexin, ferrous sulfate, nexium, omega-3 fatty acids, ondansetron, oseltamivir, promethazine-dextromethorphan, ranitidine, and vitamin b-12. Also is allergic to anticoagulant compound; asa [aspirin]; and nsaids.  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 Also  has a past surgical history that includes oral surgeries (70's and 80's); Dilation and curettage of uterus (26,71,24,58); Melanoma excision (2006); Tonsillectomy; Hernia repair; Exploratory laparotomy; Ventral hernia repair (N/A, 10/29/2012); Colonoscopy (02/2010); Dilatation & currettage/hysteroscopy with resectoscope (N/A, 04/21/2015); and Dilation and evacuation (N/A, 04/21/2015).   Objective:   Vitals: BP 110/70 (BP Location: Right Arm, Patient Position: Sitting, Cuff Size: Normal)   Pulse (!) 109   Temp 98.5 F (36.9 C) (Oral)   Resp 18   Wt 156 lb 6.4 oz (70.9 kg)   SpO2 96%   BMI 25.24 kg/m   Physical Exam Vitals signs reviewed.  Constitutional:      General: She is not in acute distress.    Appearance: She is well-developed. She is not toxic-appearing.     Comments: Appears like she does not feel well lying on exam table.   HENT:     Head: Normocephalic and atraumatic.     Right Ear: Tympanic membrane, ear canal and external ear normal.     Left Ear: Tympanic membrane, ear canal and external ear normal.     Nose: Congestion and rhinorrhea present.     Right Sinus: No maxillary sinus tenderness or frontal sinus tenderness.     Left Sinus: No maxillary sinus tenderness or frontal sinus tenderness.     Mouth/Throat:     Lips: Pink.     Mouth: Mucous membranes are moist.     Pharynx: Uvula midline. Posterior oropharyngeal erythema present.     Tonsils: No tonsillar exudate or tonsillar abscesses. Swelling: 1+ on the right. 1+ on the left.  Eyes:     Conjunctiva/sclera: Conjunctivae normal.  Neck:     Musculoskeletal: Full passive  range of motion without pain and normal range of motion.  Cardiovascular:     Rate and Rhythm: Normal rate and regular rhythm.     Heart sounds: Normal heart sounds.  Pulmonary:     Effort: Pulmonary  effort is normal. No tachypnea or accessory muscle usage.     Breath sounds: Normal breath sounds. No decreased breath sounds, wheezing, rhonchi or rales.  Abdominal:     General: Abdomen is flat. Bowel sounds are normal.     Palpations: Abdomen is soft.     Tenderness: There is no abdominal tenderness.  Lymphadenopathy:     Head:     Right side of head: No submental, submandibular, tonsillar, preauricular, posterior auricular or occipital adenopathy.     Left side of head: No submental, submandibular, tonsillar, preauricular, posterior auricular or occipital adenopathy.     Cervical: No cervical adenopathy.     Upper Body:     Right upper body: No supraclavicular adenopathy.     Left upper body: No supraclavicular adenopathy.  Skin:    General: Skin is warm and dry.  Neurological:     Mental Status: She is alert.     Results for orders placed or performed in visit on 03/02/18 (from the past 24 hour(s))  POCT Influenza A/B     Status: Abnormal   Collection Time: 03/02/18 11:45 AM  Result Value Ref Range   Influenza A, POC Positive (A) Negative   Influenza B, POC Negative Negative    Assessment and Plan :  1. Influenza A POCT influenza test positive for influenza A. Pt is afebrile, slightly tachycardic at 109 bpm. Appears like she does not feel well, NAD. Discussed natural history of the disease and treatment options; including supportive care and antiviral therapy.  She is at low risk for serious influenza complications but prefers antiviral therapy as she is a Marine scientist. Provided pt with tamiflu Rx.  Encouraged rest, hydration, and to continue OTC tylenol s prescribed for fever. May use cough syrup and zofran as prescribed for cough and nausea. Educated on proper Comptroller. Recommended wearing a mask daily especially around other people. Remain out of work until afebrile/sx free for 48 hours w/o use of antipyretics. Educated on potential complications of the flu. Advised to follow up  in clinic or with family doctor, local urgent care, or ED if develops any of these concerning symptoms or if current symptoms persist outside of discussed boundaries. - oseltamivir (TAMIFLU) 75 MG capsule; Take 1 capsule (75 mg total) by mouth 2 (two) times daily.  Dispense: 10 capsule; Refill: 0 - ondansetron (ZOFRAN-ODT) 8 MG disintegrating tablet; Take 1 tablet (8 mg total) by mouth every 8 (eight) hours as needed for nausea.  Dispense: 20 tablet; Refill: 0 - promethazine-dextromethorphan (PROMETHAZINE-DM) 6.25-15 MG/5ML syrup; Take 5 mLs by mouth 4 (four) times daily as needed for cough.  Dispense: 118 mL; Refill: 0  2. Flu-like symptoms - POCT Influenza A/B    Tenna Delaine, PA-C  [facility] 03/02/2018 11:57 AM

## 2018-03-02 NOTE — Patient Instructions (Signed)
You have tested positive for the flu, you are contagious until you are symptom free for 48 hours.Please stay out of work until you are no longer contagious. I have given you a prescription for tamiflu, please take as prescribed. I have also given you zofran for nausea and cough syrup for cough. I recommend resting, oral hydration, and OTC tylenol for fever and general discomfort.  Two major complications after the flu are pneumonia and sinus infections. Please be aware of this and if you are not any better in 7-10 days or you develop worsening cough or sinus pressure, seek care at our clinic or the ED. Continue to wash your hands and wear a mask daily especially around other people.   Influenza, Adult Influenza is also called "the flu." It is an infection in the lungs, nose, and throat (respiratory tract). It is caused by a virus. The flu causes symptoms that are similar to symptoms of a cold. It also causes a high fever and body aches. The flu spreads easily from person to person (is contagious). Getting a flu shot (influenza vaccination) every year is the best way to prevent the flu. What are the causes? This condition is caused by the influenza virus. You can get the virus by:  Breathing in droplets that are in the air from the cough or sneeze of a person who has the virus.  Touching something that has the virus on it (is contaminated) and then touching your mouth, nose, or eyes. What increases the risk? Certain things may make you more likely to get the flu. These include:  Not washing your hands often.  Having close contact with many people during cold and flu season.  Touching your mouth, eyes, or nose without first washing your hands.  Not getting a flu shot every year. You may have a higher risk for the flu, along with serious problems such as a lung infection (pneumonia), if you:  Are older than 65.  Are pregnant.  Have a weakened disease-fighting system (immune system) because of  a disease or taking certain medicines.  Have a long-term (chronic) illness, such as: ? Heart, kidney, or lung disease. ? Diabetes. ? Asthma.  Have a liver disorder.  Are very overweight (morbidly obese).  Have anemia. This is a condition that affects your red blood cells. What are the signs or symptoms? Symptoms usually begin suddenly and last 4-14 days. They may include:  Fever and chills.  Headaches, body aches, or muscle aches.  Sore throat.  Cough.  Runny or stuffy (congested) nose.  Chest discomfort.  Not wanting to eat as much as normal (poor appetite).  Weakness or feeling tired (fatigue).  Dizziness.  Feeling sick to your stomach (nauseous) or throwing up (vomiting). How is this treated? If the flu is found early, you can be treated with medicine that can help reduce how bad the illness is and how long it lasts (antiviral medicine). This may be given by mouth (orally) or through an IV tube. Taking care of yourself at home can help your symptoms get better. Your doctor may suggest:  Taking over-the-counter medicines.  Drinking plenty of fluids. The flu often goes away on its own. If you have very bad symptoms or other problems, you may be treated in a hospital. Follow these instructions at home:     Activity  Rest as needed. Get plenty of sleep.  Stay home from work or school as told by your doctor. ? Do not leave home until  you do not have a fever for 24 hours without taking medicine. ? Leave home only to visit your doctor. Eating and drinking  Take an ORS (oral rehydration solution). This is a drink that is sold at pharmacies and stores.  Drink enough fluid to keep your pee (urine) pale yellow.  Drink clear fluids in small amounts as you are able. Clear fluids include: ? Water. ? Ice chips. ? Fruit juice that has water added (diluted fruit juice). ? Low-calorie sports drinks.  Eat bland, easy-to-digest foods in small amounts as you are able.  These foods include: ? Bananas. ? Applesauce. ? Rice. ? Lean meats. ? Toast. ? Crackers.  Do not eat or drink: ? Fluids that have a lot of sugar or caffeine. ? Alcohol. ? Spicy or fatty foods. General instructions  Take over-the-counter and prescription medicines only as told by your doctor.  Use a cool mist humidifier to add moisture to the air in your home. This can make it easier for you to breathe.  Cover your mouth and nose when you cough or sneeze.  Wash your hands with soap and water often, especially after you cough or sneeze. If you cannot use soap and water, use alcohol-based hand sanitizer.  Keep all follow-up visits as told by your doctor. This is important. How is this prevented?   Get a flu shot every year. You may get the flu shot in late summer, fall, or winter. Ask your doctor when you should get your flu shot.  Avoid contact with people who are sick during fall and winter (cold and flu season). Contact a doctor if:  You get new symptoms.  You have: ? Chest pain. ? Watery poop (diarrhea). ? A fever.  Your cough gets worse.  You start to have more mucus.  You feel sick to your stomach.  You throw up. Get help right away if you:  Have shortness of breath.  Have trouble breathing.  Have skin or nails that turn a bluish color.  Have very bad pain or stiffness in your neck.  Get a sudden headache.  Get sudden pain in your face or ear.  Cannot eat or drink without throwing up. Summary  Influenza ("the flu") is an infection in the lungs, nose, and throat. It is caused by a virus.  Take over-the-counter and prescription medicines only as told by your doctor.  Getting a flu shot every year is the best way to avoid getting the flu. This information is not intended to replace advice given to you by your health care provider. Make sure you discuss any questions you have with your health care provider. Document Released: 11/14/2007 Document  Revised: 07/23/2017 Document Reviewed: 07/23/2017 Elsevier Interactive Patient Education  2019 [employer].

## 2018-03-06 ENCOUNTER — Telehealth: Payer: Self-pay

## 2018-03-06 NOTE — Telephone Encounter (Signed)
Spoke with Dr. Cathleen Corti regarding pts question. He recommended pt wait until she is completely over the flu and feeling better, that theres no rush to get this vaccine. Pt notified.  Copied from [place] [id number]. Topic: General - Other >> Mar 06, 2018 11:27 AM Jennifer Sharp wrote: Reason for CRM: Patient called to get some clarification on an appointment that she has on 03/10/2018 for second shingles vaccine. Per patient she has had the flu and has been on anti virals for five days and want to know if its Sharp good idea to still come and get this injection since the anti virals are still in her system. Please advise Ph# [phone]

## 2018-03-10 ENCOUNTER — Ambulatory Visit: Payer: 59

## 2018-03-31 ENCOUNTER — Telehealth: Payer: Self-pay | Admitting: Neurology

## 2018-03-31 DIAGNOSIS — Q283 Other malformations of cerebral vessels: Secondary | ICD-10-CM

## 2018-03-31 NOTE — Telephone Encounter (Signed)
Patient's last MRI brain w/wo was completed on 05/17/17.  She is ready to schedule her yearly follow up with Dr. Krista Blue.  She would like to have her repeat MRI prior to this appt so that it can be reviewed while she is here.

## 2018-03-31 NOTE — Telephone Encounter (Signed)
Pt states her last MRI was on 05-17-2017 pt is asking if Dr Krista Blue is wanting her to have her MRI done before scheduling her annual f/u or if Dr Krista Blue wants to see her 1st.  Please call

## 2018-04-01 NOTE — Telephone Encounter (Signed)
MRI brain w/wo was ordered.

## 2018-04-01 NOTE — Addendum Note (Signed)
Addended by: Marcial Pacas on: 04/01/2018 10:24 AM   Modules accepted: Orders

## 2018-04-06 ENCOUNTER — Telehealth: Payer: Self-pay | Admitting: Neurology

## 2018-04-06 NOTE — Telephone Encounter (Signed)
MR Brain w/wo contrast Dr. Edmund Hilda UMR Auth: [place] Ref # [id number]. Patient is scheduled for 04/15/18 at [facility].

## 2018-04-10 ENCOUNTER — Ambulatory Visit: Payer: 59

## 2018-04-14 ENCOUNTER — Ambulatory Visit: Payer: 59

## 2018-04-15 ENCOUNTER — Ambulatory Visit: Payer: 59

## 2018-04-15 DIAGNOSIS — Q283 Other malformations of cerebral vessels: Secondary | ICD-10-CM

## 2018-04-15 MED ORDER — GADOBENATE DIMEGLUMINE 529 MG/ML IV SOLN
15.0000 mL | Freq: Once | INTRAVENOUS | Status: AC | PRN
  Administered 2018-04-15: 15 mL via INTRAVENOUS

## 2018-04-16 ENCOUNTER — Ambulatory Visit: Payer: 59

## 2018-04-17 ENCOUNTER — Telehealth: Payer: Self-pay | Admitting: Neurology

## 2018-04-17 NOTE — Telephone Encounter (Signed)
Please call patient, repeat MRI of brain with without contrast showed stable multiple supratentorium, midbrain, and cerebellum cavernous malformation, there was no acute findings.  IMPRESSION:   Abnormal MRI brain (with and without) demonstrating: - Stable, multiple supratentorial, midbrain and cerebellar cavernous malformations. - No acute findings.

## 2018-04-17 NOTE — Telephone Encounter (Signed)
Pt called back. She is aware MRI stable and no acute findings but wants to make sure that there were no changes since last MRI and next steps from here. Should she follow up yearly? Anything else Dr. Krista Blue suggests from here?  Advised I will send message to Dr. Krista Blue and call back. She verbalized understanding.

## 2018-04-17 NOTE — Telephone Encounter (Signed)
Called, LVM for pt about results. Gave [facility] phone number if she has further questions.

## 2018-04-20 ENCOUNTER — Ambulatory Visit (INDEPENDENT_AMBULATORY_CARE_PROVIDER_SITE_OTHER): Payer: 59

## 2018-04-20 DIAGNOSIS — Z23 Encounter for immunization: Secondary | ICD-10-CM | POA: Diagnosis not present

## 2018-04-20 NOTE — Telephone Encounter (Signed)
I was able to talk with patient about MRI findings, overall there was no significant changes,  She will contact me for next yearly MRI  She is also concerned about further evaluation for her son and daughter, she will likely have a family discussion with them first,

## 2018-06-12 DIAGNOSIS — G4733 Obstructive sleep apnea (adult) (pediatric): Secondary | ICD-10-CM | POA: Diagnosis not present

## 2018-07-24 DIAGNOSIS — L820 Inflamed seborrheic keratosis: Secondary | ICD-10-CM | POA: Diagnosis not present

## 2018-07-24 DIAGNOSIS — D2270 Melanocytic nevi of unspecified lower limb, including hip: Secondary | ICD-10-CM | POA: Diagnosis not present

## 2018-07-24 DIAGNOSIS — L814 Other melanin hyperpigmentation: Secondary | ICD-10-CM | POA: Diagnosis not present

## 2018-07-24 DIAGNOSIS — L821 Other seborrheic keratosis: Secondary | ICD-10-CM | POA: Diagnosis not present

## 2018-08-18 DIAGNOSIS — H5211 Myopia, right eye: Secondary | ICD-10-CM | POA: Diagnosis not present

## 2018-10-07 DIAGNOSIS — G4733 Obstructive sleep apnea (adult) (pediatric): Secondary | ICD-10-CM | POA: Diagnosis not present

## 2018-12-01 ENCOUNTER — Encounter: Payer: Self-pay | Admitting: Family Medicine

## 2018-12-01 ENCOUNTER — Ambulatory Visit (INDEPENDENT_AMBULATORY_CARE_PROVIDER_SITE_OTHER): Payer: 59 | Admitting: Family Medicine

## 2018-12-01 ENCOUNTER — Other Ambulatory Visit: Payer: Self-pay

## 2018-12-01 VITALS — BP 125/84 | HR 75 | Temp 97.3°F | Resp 18 | Ht 66.5 in | Wt 153.5 lb

## 2018-12-01 DIAGNOSIS — G4733 Obstructive sleep apnea (adult) (pediatric): Secondary | ICD-10-CM

## 2018-12-01 DIAGNOSIS — E538 Deficiency of other specified B group vitamins: Secondary | ICD-10-CM | POA: Diagnosis not present

## 2018-12-01 DIAGNOSIS — Z0000 Encounter for general adult medical examination without abnormal findings: Secondary | ICD-10-CM

## 2018-12-01 DIAGNOSIS — R140 Abdominal distension (gaseous): Secondary | ICD-10-CM | POA: Insufficient documentation

## 2018-12-01 DIAGNOSIS — E7849 Other hyperlipidemia: Secondary | ICD-10-CM

## 2018-12-01 DIAGNOSIS — C439 Malignant melanoma of skin, unspecified: Secondary | ICD-10-CM

## 2018-12-01 DIAGNOSIS — Z79899 Other long term (current) drug therapy: Secondary | ICD-10-CM | POA: Diagnosis not present

## 2018-12-01 DIAGNOSIS — E2839 Other primary ovarian failure: Secondary | ICD-10-CM

## 2018-12-01 DIAGNOSIS — R7303 Prediabetes: Secondary | ICD-10-CM | POA: Diagnosis not present

## 2018-12-01 DIAGNOSIS — Z9189 Other specified personal risk factors, not elsewhere classified: Secondary | ICD-10-CM | POA: Diagnosis not present

## 2018-12-01 DIAGNOSIS — Z9989 Dependence on other enabling machines and devices: Secondary | ICD-10-CM

## 2018-12-01 LAB — LIPID PANEL
Cholesterol: 157 mg/dL (ref 0–200)
HDL: 57.9 mg/dL (ref >39.00)
LDL Cholesterol: 86 mg/dL (ref 0–99)
NonHDL: 99.31
Total CHOL/HDL Ratio: 3
Triglycerides: 68 mg/dL (ref 0.0–149.0)
VLDL: 13.6 mg/dL (ref 0.0–40.0)

## 2018-12-01 LAB — COMPREHENSIVE METABOLIC PANEL
ALT: 15 U/L (ref 0–35)
AST: 13 U/L (ref 0–37)
Albumin: 4.5 g/dL (ref 3.5–5.2)
Alkaline Phosphatase: 62 U/L (ref 39–117)
BUN: 18 mg/dL (ref 6–23)
CO2: 31 mEq/L (ref 19–32)
Calcium: 10 mg/dL (ref 8.4–10.5)
Chloride: 103 mEq/L (ref 96–112)
Creatinine, Ser: 0.73 mg/dL (ref 0.40–1.20)
GFR: 82.04 mL/min (ref >60.00)
Glucose, Bld: 95 mg/dL (ref 70–99)
Potassium: 4.3 mEq/L (ref 3.5–5.1)
Sodium: 142 mEq/L (ref 135–145)
Total Bilirubin: 0.7 mg/dL (ref 0.2–1.2)
Total Protein: 6.6 g/dL (ref 6.0–8.3)

## 2018-12-01 LAB — CBC
HCT: 44.3 % (ref 36.0–46.0)
Hemoglobin: 14.8 g/dL (ref 12.0–15.0)
MCHC: 33.3 g/dL (ref 30.0–36.0)
MCV: 94.1 fl (ref 78.0–100.0)
Platelets: 247 10*3/uL (ref 150.0–400.0)
RBC: 4.71 Mil/uL (ref 3.87–5.11)
RDW: 12.8 % (ref 11.5–15.5)
WBC: 6.5 10*3/uL (ref 4.0–10.5)

## 2018-12-01 LAB — HEMOGLOBIN A1C: Hgb A1c MFr Bld: 6.1 % (ref 4.6–6.5)

## 2018-12-01 LAB — VITAMIN B12: Vitamin B-12: 196 pg/mL — ABNORMAL LOW (ref 211–911)

## 2018-12-01 LAB — TSH: TSH: 1.56 u[IU]/mL (ref 0.35–4.50)

## 2018-12-01 NOTE — Progress Notes (Signed)
Patient ID: Jennifer Sharp, female  DOB: 04/12/1960, 58 y.o.   MRN: [id number] Patient Care Team    Relationship Specialty Notifications Start End  Ma Hillock, DO PCP - General Family Medicine  09/13/15   Earnstine Regal, PA-C Physician Assistant Obstetrics and Gynecology  04/04/15    Comment: [facility]  Haygood, Seymour Bars, MD (Inactive) Consulting Physician Obstetrics and Gynecology  04/13/15   Milus Banister, MD Attending Physician Gastroenterology  09/13/15   [place], Heather, [place]  Optometry  09/13/15   Waynetta Sandy, MD Consulting Physician Vascular Surgery  05/12/17   Mardelle Matte, MD Referring Physician Dermatology  12/01/18     Chief Complaint  Patient presents with  . Annual Exam    Fasting. Mammogram and pap smear completed 12/17/2017.     Subjective:  Jennifer Sharp is a 58 y.o.  Female  present for CPE . All past medical history, surgical history, allergies, family history, immunizations, medications and social history were updated in the electronic medical record today. All recent labs, ED visits and hospitalizations within the last year were reviewed.  Bloating: pt reports increase in bloating over the last few months. Bloating is intermittent and usually present only in the morning. This will cause diffuse mild ABD discomfort when it occurs. She denies any nausea or vomit, or bowel changes. She has had chronic constipation in the past, but feels it is improved since stopping her calcium supplement. No unintentional weight loss.    Health maintenance: updated  12/01/18  Completed 11/28/2014, by Dr. Ardis Hughs, resutls normal. follow up 5 yr (Fhx). Personal history of polyps at 76 yr.  Mammogram: completed: 11/2017, birads 1.At gyn office, reported normal. Cervical cancer screening: last pap: 11/2017, results: normal, completed by: Dr. Leo Grosser (GYN). Uterine polyp removed2017. Immunizations: tdap7/2017 utd, Influenza UTD receives  through work.(encouraged yearly).  Shingrix completed 2020 Infectious disease screening:hiv and hep c completed DEXA: 06/13/2016--> normal, Fhx osteoporosis, estrogen deficient. Long term use of nexium. Vit D normal. Assistive device: none Oxygen [id number] Patient has a Dental home. Hospitalizations/ED visits: reviewed   Depression screen [facility] 2/9 12/01/2018 08/13/2017 05/31/2016 09/13/2015  Decreased Interest 0 0 0 0  Down, Depressed, Hopeless 1 0 0 0  PHQ - 2 Score 1 0 0 0   No flowsheet data found.   Immunization History  Administered Date(s) Administered  . Influenza Split 10/28/2016  . Influenza Whole 11/19/2010  . Influenza,inj,quad, With Preservative 11/19/2014  . Influenza-Unspecified 11/19/2014, 11/08/2016, 11/05/2017  . Tdap 09/13/2015  . Zoster Recombinat (Shingrix) 12/16/2017, 04/20/2018    Past Medical History:  Diagnosis Date  . Ankle swelling    occasional   . Cavernoma 05/2017  . Colon polyps 07/25/2010  . Complication of anesthesia    urinary retention  . DUB (dysfunctional uterine bleeding) 2016/2017   GYN did endometrial bx (BENIGN PATH) + pap 04/11/15; u/s to follow  . Edema   . Endometrial polyp   . GERD (gastroesophageal reflux disease) 07/25/2010   with schatzki's ring on endo 07/2010  . History of recurrent UTIs 07/25/2010  . Melanoma ([place]) 2006   on right arm  . Miscarriage   . OSA on CPAP 10/02/2011  . Premenstrual migraine    headaches since bleeding started  . Schatzki's ring   . Seasonal allergic rhinitis   . SI (sacroiliac) joint inflammation ([place])   . Tachycardia    H/O transient  tach. as a chlid   . Umbilical hernia [id number]  .  Unspecified constipation 07/19/2012   Allergies  Allergen Reactions  . Anticoagulant Compound   . Asa [Aspirin]   . Nsaids    Past Surgical History:  Procedure Laterality Date  . COLONOSCOPY  02/2010   5 yr recall ([facility] colon cancer)  . DILATATION & CURRETTAGE/HYSTEROSCOPY WITH RESECTOCOPE N/A 04/21/2015   Path:  benign. Procedure: [place];  Surgeon: Eldred Manges, MD;  Location: [facility];  Service: Gynecology;  Laterality: N/A;  . DILATION AND CURETTAGE OF UTERUS  [phone]  . DILATION AND EVACUATION N/A 04/21/2015   Done for DUB/Menorrhagia.  Procedure: DILATATION AND EVACUATION;  Surgeon: Eldred Manges, MD;  Location: [facility];  Service: Gynecology;  Laterality: N/A;  . EXPLORATORY LAPAROTOMY    . HERNIA REPAIR     left inguinal  . MELANOMA EXCISION  2006   R arm  . oral surgeries  70's and 80's  . TONSILLECTOMY     childhood  . VENTRAL HERNIA REPAIR N/A 10/29/2012   Procedure: LAPAROSCOPIC VENTRAL HERNIA WITH MESH;  Surgeon: Shann Medal, MD;  Location: [facility];  Service: General;  Laterality: N/A;   Family History  Problem Relation Age of Onset  . Hyperlipidemia Mother   . Hypertension Mother   . Stroke Mother   . Cancer Father 34       ish, colon- remission/ skin cancer  . Other Brother        stent placed//AVM  . Hypertension Brother   . Hyperlipidemia Brother   . Heart disease Brother        2 cardiac stents  . Other Daughter        heart ablation/ heart arrythmia  . ADD / ADHD Daughter        ADD  . Heart disease Daughter 77       tachyarrythmia, scheduled for ablation   . ADD / ADHD Son        ADD  . Cancer Paternal Uncle        colon  . Diabetes Maternal Grandmother   . Stroke Maternal Grandmother   . Hypertension Maternal Grandmother   . Alzheimer's disease Maternal Grandmother   . Diabetes Maternal Grandfather   . Other Maternal Grandfather        cardiac disease  . Cancer Paternal Grandmother        colon  . Allergy (severe) Brother        smoker  . Other Brother        respiratory problems   Social History   Social History Narrative   Married. Right handed.   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    Wears seatbelt. Smoke detector in the home.    Caffeine use: 1 cup coffee per day.     Allergies as of 12/01/2018       Reactions   Anticoagulant Compound    Asa [aspirin]    Nsaids       Medication List       Accurate as of December 01, 2018  9:34 AM. If you have any questions, ask your nurse or doctor.        STOP taking these medications   cephALEXin 500 MG capsule Commonly known as: KEFLEX Stopped by: Howard Pouch, DO   NexIUM 40 MG capsule Generic drug: esomeprazole Stopped by: Howard Pouch, DO   ondansetron 8 MG disintegrating tablet Commonly known as: ZOFRAN-ODT Stopped by: Howard Pouch, DO   oseltamivir 75 MG capsule Commonly known as: TAMIFLU  Stopped by: Howard Pouch, DO   promethazine-dextromethorphan 6.25-15 MG/5ML syrup Commonly known as: PROMETHAZINE-DM Stopped by: Howard Pouch, DO   ranitidine 300 MG tablet Commonly known as: ZANTAC Stopped by: Howard Pouch, DO   vitamin B-12 1000 MCG tablet Commonly known as: CYANOCOBALAMIN Stopped by: Howard Pouch, DO     TAKE these medications   acetaminophen 500 MG tablet Commonly known as: TYLENOL Take 500 mg by mouth every 6 (six) hours as needed.   calcium carbonate 600 MG Tabs tablet Commonly known as: OS-CAL Take 600 mg by mouth daily.   ferrous sulfate 325 (65 FE) MG tablet Take 325 mg by mouth daily with breakfast.   FISH OIL PO Take 1 capsule by mouth daily. Omega Red       All past medical history, surgical history, allergies, family history, immunizations andmedications were updated in the EMR today and reviewed under the history and medication portions of their EMR.     No results found for this or any previous visit (from the past 2160 hour(s)).   ROS: 14 pt review of systems performed and negative (unless mentioned in an HPI)  Objective: BP 125/84 (BP Location: Right Arm, Patient Position: Sitting, Cuff Size: Normal)   Pulse 75   Temp (!) 97.3 F (36.3 C) (Temporal)   Resp 18   Ht 5' 6.5" (1.689 m)   Wt 153 lb 8 oz (69.6 kg)   SpO2 99%   BMI 24.40 kg/m  Gen: Afebrile. No acute distress.  Nontoxic in appearance, well-developed, well-nourished,  Pleasant female.  HENT: AT. [place]. Bilateral TM visualized and normal in appearance, normal external auditory canal. MMM, no oral lesions, adequate dentition. Bilateral nares within normal limits. Throat without erythema, ulcerations or exudates. no Cough on exam, no hoarseness on exam. Eyes:Pupils Equal Round Reactive to light, Extraocular movements intact,  Conjunctiva without redness, discharge or icterus. Neck/lymp/endocrine: Supple,no lymphadenopathy, no thyromegaly CV: RRR no murmur, no edema, +2/4 P posterior tibialis pulses. no carotid bruits. No JVD. Chest: CTAB, no wheeze, rhonchi or crackles. normal Respiratory effort. good Air movement. Abd: Soft. falt. NTND. BS present. no Masses palpated. No hepatosplenomegaly. No rebound tenderness or guarding. Skin: no rashes, purpura or petechiae. Warm and well-perfused. Skin intact. Neuro/Msk:  Normal gait. PERLA. EOMi. Alert. Oriented x3.  Cranial nerves II through XII intact. Muscle strength 5/5 upper/lower extremity. DTRs equal bilaterally. Psych: Normal affect, dress and demeanor. Normal speech. Normal thought content and judgment.   No exam data present  Assessment/plan: Jennifer Sharp is a 58 y.o. female present for CPE  hyperlipidemia Diet and exercise. She has lost weight this year. - CBC - Comprehensive metabolic panel - Lipid panel - TSH Malignant melanoma, unspecified site [facility]) Follows with [facility] derm.   OSA on CPAP Follows w/ pulm  b12 deficiency:  B12 level collected today- no longer on PPI (pt choice).  Prediabetes - watching her diet and exercising routinely. She has lost weight over the last year and brought down BMI into normal.  - CBC - Comprehensive metabolic panel - Hemoglobin A1c  At high risk for osteoporosis/Estrogen deficiency DEXA normal 2018. Rpt 3-5 yrs.   Bloating:  - food diary - Eat last meal of day at least 4-5 hr prior to bed time.  -  try GasX OTC before bed.  - suggested probiotic- but she felt she had increased bloating on probiotic.  - if worsening suggest f/u with GI- since colonoscopy is almost due for 5 yr rpt and make sure  her GYN exams are UTD.  - pt reports understanding.   Routine general medical examination at a health care facility Patient was encouraged to exercise greater than 150 minutes a week. Patient was encouraged to choose a diet filled with fresh fruits and vegetables, and lean meats. AVS provided to patient today for education/recommendation on gender specific health and safety maintenance. Completed 11/28/2014, by Dr. Ardis Hughs, resutls normal. follow up 5 yr (Fhx). Personal history of polyps at 63 yr.  Mammogram: completed: 11/2017, birads 1.At gyn office, reported normal. Cervical cancer screening: last pap: 11/2017, results: normal, completed by: Dr. Leo Grosser (GYN). Uterine polyp removed2017. Immunizations: tdap7/2017 utd, Influenza UTD receives through work.(encouraged yearly).  Shingrix completed 2020 Infectious disease screening:hiv and hep c completed DEXA: 06/13/2016--> normal, Fhx osteoporosis, estrogen deficient. Long term use of nexium. Vit D normal.   Return in about 1 year (around 12/01/2019) for CPE (30 min).  Electronically signed by: Howard Pouch, DO [place]

## 2018-12-01 NOTE — Patient Instructions (Addendum)
Health Maintenance, Female Adopting a healthy lifestyle and getting preventive care are important in promoting health and wellness. Ask your health care provider about:  The right schedule for you to have regular tests and exams.  Things you can do on your own to prevent diseases and keep yourself healthy. What should I know about diet, weight, and exercise? Eat a healthy diet   Eat a diet that includes plenty of vegetables, fruits, low-fat dairy products, and lean protein.  Do not eat a lot of foods that are high in solid fats, added sugars, or sodium. Maintain a healthy weight Body mass index (BMI) is used to identify weight problems. It estimates body fat based on height and weight. Your health care provider can help determine your BMI and help you achieve or maintain a healthy weight. Get regular exercise Get regular exercise. This is one of the most important things you can do for your health. Most adults should:  Exercise for at least 150 minutes each week. The exercise should increase your heart rate and make you sweat (moderate-intensity exercise).  Do strengthening exercises at least twice a week. This is in addition to the moderate-intensity exercise.  Spend less time sitting. Even light physical activity can be beneficial. Watch cholesterol and blood lipids Have your blood tested for lipids and cholesterol at 57 years of age, then have this test every 5 years. Have your cholesterol levels checked more often if:  Your lipid or cholesterol levels are high.  You are older than 57 years of age.  You are at high risk for heart disease. What should I know about cancer screening? Depending on your health history and family history, you may need to have cancer screening at various ages. This may include screening for:  Breast cancer.  Cervical cancer.  Colorectal cancer.  Skin cancer.  Lung cancer. What should I know about heart disease, diabetes, and high blood  pressure? Blood pressure and heart disease  High blood pressure causes heart disease and increases the risk of stroke. This is more likely to develop in people who have high blood pressure readings, are of African descent, or are overweight.  Have your blood pressure checked: ? Every 3-5 years if you are 18-39 years of age. ? Every year if you are 40 years old or older. Diabetes Have regular diabetes screenings. This checks your fasting blood sugar level. Have the screening done:  Once every three years after age 40 if you are at a normal weight and have a low risk for diabetes.  More often and at a younger age if you are overweight or have a high risk for diabetes. What should I know about preventing infection? Hepatitis B If you have a higher risk for hepatitis B, you should be screened for this virus. Talk with your health care provider to find out if you are at risk for hepatitis B infection. Hepatitis C Testing is recommended for:  Everyone born from 1945 through 1965.  Anyone with known risk factors for hepatitis C. Sexually transmitted infections (STIs)  Get screened for STIs, including gonorrhea and chlamydia, if: ? You are sexually active and are younger than 57 years of age. ? You are older than 57 years of age and your health care provider tells you that you are at risk for this type of infection. ? Your sexual activity has changed since you were last screened, and you are at increased risk for chlamydia or gonorrhea. Ask your health care provider if   you are at risk.  Ask your health care provider about whether you are at high risk for HIV. Your health care provider may recommend a prescription medicine to help prevent HIV infection. If you choose to take medicine to prevent HIV, you should first get tested for HIV. You should then be tested every 3 months for as long as you are taking the medicine. Pregnancy  If you are about to stop having your period (premenopausal) and  you may become pregnant, seek counseling before you get pregnant.  Take 400 to 800 micrograms (mcg) of folic acid every day if you become pregnant.  Ask for birth control (contraception) if you want to prevent pregnancy. Osteoporosis and menopause Osteoporosis is a disease in which the bones lose minerals and strength with aging. This can result in bone fractures. If you are 65 years old or older, or if you are at risk for osteoporosis and fractures, ask your health care provider if you should:  Be screened for bone loss.  Take a calcium or vitamin D supplement to lower your risk of fractures.  Be given hormone replacement therapy (HRT) to treat symptoms of menopause. Follow these instructions at home: Lifestyle  Do not use any products that contain nicotine or tobacco, such as cigarettes, e-cigarettes, and chewing tobacco. If you need help quitting, ask your health care provider.  Do not use street drugs.  Do not share needles.  Ask your health care provider for help if you need support or information about quitting drugs. Alcohol use  Do not drink alcohol if: ? Your health care provider tells you not to drink. ? You are pregnant, may be pregnant, or are planning to become pregnant.  If you drink alcohol: ? Limit how much you use to 0-1 drink a day. ? Limit intake if you are breastfeeding.  Be aware of how much alcohol is in your drink. In the [place] one drink equals one 12 oz bottle of beer (355 mL), one 5 oz glass of wine (148 mL), or one 1 oz glass of hard liquor (44 mL). General instructions  Schedule regular health, dental, and eye exams.  Stay current with your vaccines.  Tell your health care provider if: ? You often feel depressed. ? You have ever been abused or do not feel safe at home. Summary  Adopting a healthy lifestyle and getting preventive care are important in promoting health and wellness.  Follow your health care provider's instructions about healthy  diet, exercising, and getting tested or screened for diseases.  Follow your health care provider's instructions on monitoring your cholesterol and blood pressure. This information is not intended to replace advice given to you by your health care provider. Make sure you discuss any questions you have with your health care provider. Document Released: 08/20/2010 Document Revised: 01/28/2018 Document Reviewed: 01/28/2018 Elsevier Patient Education  2020 [employer]  

## 2019-02-09 DIAGNOSIS — G4733 Obstructive sleep apnea (adult) (pediatric): Secondary | ICD-10-CM | POA: Diagnosis not present

## 2019-02-16 DIAGNOSIS — Z01419 Encounter for gynecological examination (general) (routine) without abnormal findings: Secondary | ICD-10-CM | POA: Diagnosis not present

## 2019-02-16 DIAGNOSIS — Z124 Encounter for screening for malignant neoplasm of cervix: Secondary | ICD-10-CM | POA: Diagnosis not present

## 2019-02-16 DIAGNOSIS — Z1231 Encounter for screening mammogram for malignant neoplasm of breast: Secondary | ICD-10-CM | POA: Diagnosis not present

## 2019-04-09 ENCOUNTER — Ambulatory Visit: Payer: 59 | Admitting: Nurse Practitioner

## 2019-04-14 ENCOUNTER — Other Ambulatory Visit: Payer: Self-pay

## 2019-04-14 ENCOUNTER — Encounter: Payer: Self-pay | Admitting: Nurse Practitioner

## 2019-04-14 ENCOUNTER — Ambulatory Visit: Payer: 59 | Admitting: Nurse Practitioner

## 2019-04-14 VITALS — BP 100/70 | HR 72 | Temp 98.9°F | Ht 66.0 in | Wt 156.8 lb

## 2019-04-14 DIAGNOSIS — R109 Unspecified abdominal pain: Secondary | ICD-10-CM | POA: Diagnosis not present

## 2019-04-14 DIAGNOSIS — Z86010 Personal history of colonic polyps: Secondary | ICD-10-CM | POA: Diagnosis not present

## 2019-04-14 DIAGNOSIS — R140 Abdominal distension (gaseous): Secondary | ICD-10-CM | POA: Diagnosis not present

## 2019-04-14 DIAGNOSIS — K5909 Other constipation: Secondary | ICD-10-CM | POA: Diagnosis not present

## 2019-04-14 MED ORDER — NA SULFATE-K SULFATE-MG SULF 17.5-3.13-1.6 GM/177ML PO SOLN: ORAL | 0 refills | Status: DC

## 2019-04-14 MED FILL — SUPREP BOWEL PREP KIT: 17.5-3.13-1 | 1 days supply | Qty: 354 | Fill #0

## 2019-04-14 NOTE — Progress Notes (Signed)
ASSESSMENT / PLAN:   Jennifer Sharp is a 59 y.o. female with a pmh significant for, but not necessarily limited to, GERD, melanoma, OSA on CPAP, multiple cavernoma syndrome  # GERD with frequent throat clearing / dysphagia ( moist chicken only).  --Not on any medications for GERD. Stopped Nexium - heard it could have memory problems. Pantoprazole gave her palpitations. Recommend trial of Pepcid q day. -Patient concerned about symptoms. Fortunately there hasn't been any progression of dysphagia. For further evaluation patient will be scheduled for EGD. The risks and benefits of EGD were discussed and the patient agrees to proceed. If evidence for reflux on EGD or based on symptoms then should try a different PPI.     # Intermittent abdominal distention.  --Unusual in that it is not related to eating, occurs in absence of constipation..  -Says she wakes up visibly distended a couple of times a month  No N/V. Doubt partial bowel obstruction but should evaluate for next time it occurs. Placing order for 2 view abdomen for her to have done with next episode of distention  # chronic constipation.  --Manages with Miralax as needed.    # Strong family history of colon cancer on father's side.  -She is due this year for surveillance colonoscopy. Will proceed with colonoscopy at time of EGD. The risks and benefits of colonoscopy with possible polypectomy / biopsies were discussed and the patient agrees to proceed.   # Multiple Cavernoma Syndrome. Incidental finding on cervical spine MRI done for numbness of hand in 2019. Followed by Neurology. 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 Dad had colon cancer at age 21. Three paternal uncles, paternal grandmother had colon cancer. Patient's  last colonoscopy was October 2016, she is due for 5 year recall this year  Jennifer Sharp developed abdominal bloating and distention a few months ago. Her symptoms usually occur in the am before breakfast and tend to get worse throughout the day. Look pregnant on some mornings. No associated nausea / vomiting. Doesn't occur in face of constipation.   Jennifer Sharp has excessive throat clearing. Moist chicken tends to hang up in her esophagus but eveything else goes down okay. She stopped Nexium - heard it caused memory issues. Pantoprazole gave her palpitations.    Data Reviewed:  Oct 2020 CMP normal.  CBC normal   Past Medical History:  Diagnosis Date  . Ankle swelling    occasional   . Cavernoma 05/2017  . Colon polyps 07/25/2010  . Complication of anesthesia    urinary retention  . DUB (dysfunctional uterine bleeding) 2016/2017   GYN did endometrial bx (BENIGN PATH) + pap 04/11/15; u/s to follow  . Edema   . Endometrial polyp   . GERD (gastroesophageal reflux disease) 07/25/2010   with schatzki's ring on endo 07/2010  . History of recurrent UTIs 07/25/2010  . Melanoma ([place]) 2006   on right arm  . Miscarriage   . OSA on CPAP 10/02/2011  . Premenstrual migraine    headaches since bleeding started  . Schatzki's ring   . Seasonal allergic rhinitis   . SI (sacroiliac) joint inflammation ([place])   . Tachycardia    H/O transient  tach.  as a chlid   . Umbilical hernia [id number]  . Unspecified constipation 07/19/2012     Past Surgical History:  Procedure Laterality Date  . COLONOSCOPY  02/2010   5 yr recall ([facility] colon cancer)  . DILATATION & CURRETTAGE/HYSTEROSCOPY WITH RESECTOCOPE N/A 04/21/2015   Path: benign. Procedure: [place];  Surgeon: Eldred Manges, MD;  Location: [facility];  Service: Gynecology;  Laterality: N/A;  . DILATION AND CURETTAGE OF UTERUS  [phone]  . DILATION AND EVACUATION N/A 04/21/2015   Done for DUB/Menorrhagia.  Procedure: DILATATION  AND EVACUATION;  Surgeon: Eldred Manges, MD;  Location: [facility];  Service: Gynecology;  Laterality: N/A;  . EXPLORATORY LAPAROTOMY    . HERNIA REPAIR     left inguinal  . MELANOMA EXCISION  2006   R arm  . oral surgeries  70's and 80's  . TONSILLECTOMY     childhood  . VENTRAL HERNIA REPAIR N/A 10/29/2012   Procedure: LAPAROSCOPIC VENTRAL HERNIA WITH MESH;  Surgeon: Shann Medal, MD;  Location: [facility];  Service: General;  Laterality: N/A;   Family History  Problem Relation Age of Onset  . Hyperlipidemia Mother   . Hypertension Mother   . Stroke Mother   . Cancer Father 32       ish, colon- remission/ skin cancer  . Other Brother        stent placed//AVM  . Hypertension Brother   . Hyperlipidemia Brother   . Heart disease Brother        2 cardiac stents  . Other Daughter        heart ablation/ heart arrythmia  . ADD / ADHD Daughter        ADD  . Heart disease Daughter 77       tachyarrythmia, scheduled for ablation   . ADD / ADHD Son        ADD  . Cancer Paternal Uncle        colon  . Diabetes Maternal Grandmother   . Stroke Maternal Grandmother   . Hypertension Maternal Grandmother   . Alzheimer's disease Maternal Grandmother   . Diabetes Maternal Grandfather   . Other Maternal Grandfather        cardiac disease  . Cancer Paternal Grandmother        colon  . Allergy (severe) Brother        smoker  . Other Brother        respiratory problems   Social History   Tobacco Use  . Smoking status: Never Smoker  . Smokeless tobacco: Never Used  Substance Use Topics  . Alcohol use: Yes    Alcohol/week: 0.0 standard drinks    Comment: socially  . Drug use: No   Current Outpatient Medications  Medication Sig Dispense Refill  . acetaminophen (TYLENOL) 500 MG tablet Take 500 mg by mouth every 6 (six) hours as needed.    . vitamin B-12 (CYANOCOBALAMIN) 1000 MCG tablet Take 1,000 mcg by mouth daily.     No current facility-administered medications for this visit.     Allergies  Allergen Reactions  . Anticoagulant Compound   . Asa [Aspirin]   . Nsaids      Review of Systems:  All systems reviewed and negative except where noted in HPI.   Creatinine clearance cannot be calculated (Patient's most recent lab result is older than the maximum 21 days allowed.)   Physical Exam:    Wt Readings from Last 3 Encounters:  04/14/19 156 lb 12.8 oz (71.1 kg)  12/01/18 153 lb 8 oz (69.6 kg)  03/02/18 156 lb 6.4 oz (70.9 kg)    BP 100/70   Pulse 72   Temp 98.9 F (37.2 C)   Ht 5\' 6"  (1.676 m)   Wt 156 lb 12.8 oz (71.1 kg)   BMI 25.31 kg/m  Constitutional:  Pleasant female in no acute distress. Psychiatric: Normal mood and affect. Behavior is normal. EENT: Pupils normal.  Conjunctivae are normal. No scleral icterus. Neck supple.  Cardiovascular: Normal rate, regular rhythm. No edema Pulmonary/chest: Effort normal and breath sounds normal. No wheezing, rales or rhonchi. Abdominal: Soft, nondistended, nontender. Bowel sounds active throughout. There are no masses palpable. No hepatomegaly. Neurological: Alert and oriented to person place and time. Skin: Skin is warm and dry. No rashes noted.  Tye Savoy, NP  04/14/2019, 3:53 PM  Cc:  Referring Provider Raoul Pitch, Renee A, DO

## 2019-04-14 NOTE — Patient Instructions (Signed)
If you are age 58 or older, your body mass index should be between 23-30. Your Body mass index is 25.31 kg/m. If this is out of the aforementioned range listed, please consider follow up with your Primary Care Provider.  If you are age 72 or younger, your body mass index should be between 19-25. Your Body mass index is 25.31 kg/m. If this is out of the aformentioned range listed, please consider follow up with your Primary Care Provider.   You have been scheduled for an endoscopy and colonoscopy. Please follow the written instructions given to you at your visit today. Please pick up your prep supplies at the pharmacy within the next 1-3 days. If you use inhalers (even only as needed), please bring them with you on the day of your procedure. Your physician has requested that you go to www.startemmi.com and enter the access code given to you at your visit today. This web site gives a general overview about your procedure. However, you should still follow specific instructions given to you by our office regarding your preparation for the procedure.  We have sent the following medications to your pharmacy for you to pick up at your convenience: Suprep  Thank you for choosing me and [facility].   Tye Savoy, NP

## 2019-04-19 ENCOUNTER — Encounter: Payer: Self-pay | Admitting: Nurse Practitioner

## 2019-04-19 NOTE — Progress Notes (Signed)
I agree with the above note, plan 

## 2019-04-20 ENCOUNTER — Ambulatory Visit (INDEPENDENT_AMBULATORY_CARE_PROVIDER_SITE_OTHER)
Admission: RE | Admit: 2019-04-20 | Discharge: 2019-04-20 | Disposition: A | Discharge status: Home / Self Care | Payer: 59 | Source: Ambulatory Visit | Attending: Nurse Practitioner | Admitting: Nurse Practitioner

## 2019-04-20 ENCOUNTER — Other Ambulatory Visit: Payer: Self-pay

## 2019-04-20 DIAGNOSIS — K5909 Other constipation: Secondary | ICD-10-CM

## 2019-04-20 DIAGNOSIS — R140 Abdominal distension (gaseous): Secondary | ICD-10-CM | POA: Diagnosis not present

## 2019-04-20 DIAGNOSIS — R109 Unspecified abdominal pain: Secondary | ICD-10-CM

## 2019-04-29 ENCOUNTER — Encounter: Payer: Self-pay | Admitting: Gastroenterology

## 2019-05-03 ENCOUNTER — Encounter: Payer: Self-pay | Admitting: Gastroenterology

## 2019-05-03 ENCOUNTER — Other Ambulatory Visit: Payer: Self-pay

## 2019-05-03 ENCOUNTER — Ambulatory Visit (AMBULATORY_SURGERY_CENTER): Payer: 59 | Admitting: Gastroenterology

## 2019-05-03 VITALS — BP 110/58 | HR 63 | Temp 96.7°F | Resp 20 | Ht 66.0 in | Wt 156.0 lb

## 2019-05-03 DIAGNOSIS — R1084 Generalized abdominal pain: Secondary | ICD-10-CM | POA: Diagnosis not present

## 2019-05-03 DIAGNOSIS — Z1211 Encounter for screening for malignant neoplasm of colon: Secondary | ICD-10-CM

## 2019-05-03 DIAGNOSIS — K2950 Unspecified chronic gastritis without bleeding: Secondary | ICD-10-CM | POA: Diagnosis not present

## 2019-05-03 DIAGNOSIS — R1310 Dysphagia, unspecified: Secondary | ICD-10-CM

## 2019-05-03 DIAGNOSIS — Z800 Family history of malignant neoplasm of digestive organs: Secondary | ICD-10-CM | POA: Diagnosis not present

## 2019-05-03 DIAGNOSIS — D122 Benign neoplasm of ascending colon: Secondary | ICD-10-CM | POA: Diagnosis not present

## 2019-05-03 DIAGNOSIS — G4733 Obstructive sleep apnea (adult) (pediatric): Secondary | ICD-10-CM | POA: Diagnosis not present

## 2019-05-03 DIAGNOSIS — R140 Abdominal distension (gaseous): Secondary | ICD-10-CM

## 2019-05-03 DIAGNOSIS — Z86010 Personal history of colonic polyps: Secondary | ICD-10-CM | POA: Diagnosis not present

## 2019-05-03 MED ORDER — SODIUM CHLORIDE 0.9 % IV SOLN: 500.0000 mL | Freq: Once | INTRAVENOUS | Status: DC

## 2019-05-03 NOTE — Op Note (Signed)
[facility] Patient Name: Jennifer Sharp Procedure Date: 05/03/2019 2:40 PM MRN: [id number] Endoscopist: Milus Banister , MD Age: 59 Referring MD:  Date of Birth: 03/19/1960 Gender: Female Account #: [id number] Procedure:                Upper GI endoscopy Indications:              Dyspepsia, Dysphagia Medicines:                Monitored Anesthesia Care Procedure:                Pre-Anesthesia Assessment:                           - Prior to the procedure, a History and Physical                            was performed, and patient medications and                            allergies were reviewed. The patient's tolerance of                            previous anesthesia was also reviewed. The risks                            and benefits of the procedure and the sedation                            options and risks were discussed with the patient.                            All questions were answered, and informed consent                            was obtained. Prior Anticoagulants: The patient has                            taken no previous anticoagulant or antiplatelet                            agents. ASA Grade Assessment: II - A patient with                            mild systemic disease. After reviewing the risks                            and benefits, the patient was deemed in                            satisfactory condition to undergo the procedure.                           After obtaining informed consent, the endoscope was  passed under direct vision. Throughout the                            procedure, the patient's blood pressure, pulse, and                            oxygen saturations were monitored continuously. The                            Endoscope was introduced through the mouth, and                            advanced to the second part of duodenum. The upper                            GI endoscopy was accomplished  without difficulty.                            The patient tolerated the procedure well. Scope In: Scope Out: Findings:                 Mild inflammation characterized by erythema and                            granularity was found in the gastric antrum.                            Biopsies were taken with a cold forceps for                            histology.                           The exam was otherwise without abnormality. Complications:            No immediate complications. Estimated blood loss:                            None. Estimated Blood Loss:     Estimated blood loss: none. Impression:               - Mild, non-specific gastritis. Biopsied to check                            for H. pylori.                           - The examination was otherwise normal. Recommendation:           - Patient has a contact number available for                            emergencies. The signs and symptoms of potential                            delayed complications were discussed with the  patient. Return to normal activities tomorrow.                            Written discharge instructions were provided to the                            patient.                           - Resume previous diet.                           - Continue present medications.                           - Await pathology results. Milus Banister, MD 05/03/2019 3:21:28 PM This report has been signed electronically.

## 2019-05-03 NOTE — Progress Notes (Signed)
Temp by LC, Vitals by DT   Pt's states no medical or surgical changes since previsit or office visit.

## 2019-05-03 NOTE — Patient Instructions (Signed)
Thank you for allowing [place] to care for you today!  Await pathology results, approximately 1-2 weeks.  Will make recommendations at that time   Resume previous diet and medications today.  Return to your normal activities tomorrow.    YOU HAD AN ENDOSCOPIC PROCEDURE TODAY AT [facility]:   Refer to the procedure report that was given to you for any specific questions about what was found during the examination.  If the procedure report does not answer your questions, please call your gastroenterologist to clarify.  If you requested that your care partner not be given the details of your procedure findings, then the procedure report has been included in a sealed envelope for you to review at your convenience later.  YOU SHOULD EXPECT: Some feelings of bloating in the abdomen. Passage of more gas than usual.  Walking can help get rid of the air that was put into your GI tract during the procedure and reduce the bloating. If you had a lower endoscopy (such as a colonoscopy or flexible sigmoidoscopy) you may notice spotting of blood in your stool or on the toilet paper. If you underwent a bowel prep for your procedure, you may not have a normal bowel movement for a few days.  Please Note:  You might notice some irritation and congestion in your nose or some drainage.  This is from the oxygen used during your procedure.  There is no need for concern and it should clear up in a day or so.  SYMPTOMS TO REPORT IMMEDIATELY:   Following lower endoscopy (colonoscopy or flexible sigmoidoscopy):  Excessive amounts of blood in the stool  Significant tenderness or worsening of abdominal pains  Swelling of the abdomen that is new, acute  Fever of 100F or higher   Following upper endoscopy (EGD)  Vomiting of blood or coffee ground material  New chest pain or pain under the shoulder blades  Painful or persistently difficult swallowing  New shortness of breath  Fever of 100F or  higher  Black, tarry-looking stools  For urgent or emergent issues, a gastroenterologist can be reached at any hour by calling [phone]. Do not use MyChart messaging for urgent concerns.    DIET:  We do recommend a small meal at first, but then you may proceed to your regular diet.  Drink plenty of fluids but you should avoid alcoholic beverages for 24 hours.  ACTIVITY:  You should plan to take it easy for the rest of today and you should NOT DRIVE or use heavy machinery until tomorrow (because of the sedation medicines used during the test).    FOLLOW UP: Our staff will call the number listed on your records 48-72 hours following your procedure to check on you and address any questions or concerns that you may have regarding the information given to you following your procedure. If we do not reach you, we will leave a message.  We will attempt to reach you two times.  During this call, we will ask if you have developed any symptoms of COVID 19. If you develop any symptoms (ie: fever, flu-like symptoms, shortness of breath, cough etc.) before then, please call [phone].  If you test positive for Covid 19 in the 2 weeks post procedure, please call and report this information to [place].    If any biopsies were taken you will be contacted by phone or by letter within the next 1-3 weeks.  Please call us at [phone] if you have  not heard about the biopsies in 3 weeks.    SIGNATURES/CONFIDENTIALITY: You and/or your care partner have signed paperwork which will be entered into your electronic medical record.  These signatures attest to the fact that that the information above on your After Visit Summary has been reviewed and is understood.  Full responsibility of the confidentiality of this discharge information lies with you and/or your care-partner.

## 2019-05-03 NOTE — Progress Notes (Signed)
Report to PACU, RN, vss, BBS= Clear.  

## 2019-05-03 NOTE — Op Note (Signed)
[facility] Patient Name: Jennifer Sharp Procedure Date: 05/03/2019 2:41 PM MRN: [id number] Endoscopist: Milus Banister , MD Age: 59 Referring MD:  Date of Birth: Mar 01, 1960 Gender: Female Account #: [id number] Procedure:                Colonoscopy Indications:              Screening patient at increased risk: Family history                            of colorectal cancer in multiple relatives                            including her father in his 36s Medicines:                Monitored Anesthesia Care Procedure:                Pre-Anesthesia Assessment:                           - Prior to the procedure, a History and Physical                            was performed, and patient medications and                            allergies were reviewed. The patient's tolerance of                            previous anesthesia was also reviewed. The risks                            and benefits of the procedure and the sedation                            options and risks were discussed with the patient.                            All questions were answered, and informed consent                            was obtained. Prior Anticoagulants: The patient has                            taken no previous anticoagulant or antiplatelet                            agents. ASA Grade Assessment: II - A patient with                            mild systemic disease. After reviewing the risks                            and benefits, the patient was deemed in  satisfactory condition to undergo the procedure.                           After obtaining informed consent, the colonoscope                            was passed under direct vision. Throughout the                            procedure, the patient's blood pressure, pulse, and                            oxygen saturations were monitored continuously. The                            Colonoscope was introduced  through the anus and                            advanced to the the cecum, identified by                            appendiceal orifice and ileocecal valve. The                            colonoscopy was performed without difficulty. The                            patient tolerated the procedure well. The quality                            of the bowel preparation was good. Scope In: 2:50:54 PM Scope Out: 3:10:31 PM Scope Withdrawal Time: 0 hours 11 minutes 14 seconds  Total Procedure Duration: 0 hours 19 minutes 37 seconds  Findings:                 Two sessile polyps were found in the ascending                            colon. The polyps were 6 to 8 mm in size. These                            polyps were removed with a cold snare. Resection                            and retrieval were complete.                           Internal hemorrhoids were found. The hemorrhoids                            were medium-sized.                           The exam was otherwise without abnormality on  direct and retroflexion views. Complications:            No immediate complications. Estimated blood loss:                            None. Estimated Blood Loss:     Estimated blood loss: none. Impression:               - Two 6 to 8 mm polyps in the ascending colon,                            removed with a cold snare. Resected and retrieved.                           - Internal hemorrhoids.                           - The examination was otherwise normal on direct                            and retroflexion views. Recommendation:           - Patient has a contact number available for                            emergencies. The signs and symptoms of potential                            delayed complications were discussed with the                            patient. Return to normal activities tomorrow.                            Written discharge instructions were provided  to the                            patient.                           - Resume previous diet.                           - Continue present medications.                           - Await pathology results. Milus Banister, MD 05/03/2019 3:19:49 PM This report has been signed electronically.

## 2019-05-03 NOTE — Progress Notes (Signed)
Called to room to assist during endoscopic procedure.  Patient ID and intended procedure confirmed with present staff. Received instructions for my participation in the procedure from the performing physician.  

## 2019-05-04 ENCOUNTER — Telehealth: Payer: Self-pay | Admitting: *Deleted

## 2019-05-04 NOTE — Telephone Encounter (Signed)
  Follow up Call-  Call back number 05/03/2019  Post procedure Call Back phone  # [phone]  Permission to leave phone message Yes  Some recent data might be hidden     Patient questions:  Do you have a fever, pain , or abdominal swelling? No. Pain Score  0 *  Have you tolerated food without any problems? Yes.    Have you been able to return to your normal activities? Yes.    Do you have any questions about your discharge instructions: Diet   No. Medications  No. Follow up visit  No.  Do you have questions or concerns about your Care? No.  Actions: * If pain score is 4 or above: 1. No action needed, pain <4.Have you developed a fever since your procedure? no  2.   Have you had an respiratory symptoms (SOB or cough) since your procedure? no  3.   Have you tested positive for COVID 19 since your procedure no  4.   Have you had any family members/close contacts diagnosed with the COVID 19 since your procedure?  no   If yes to any of these questions please route to Joylene John, RN and Alphonsa Gin, Therapist, sports.

## 2019-05-10 ENCOUNTER — Encounter: Payer: Self-pay | Admitting: Gastroenterology

## 2019-05-22 IMAGING — MR MR CERVICAL SPINE WO/W CM
5 of 8 series · 25 of 48 positions shown · IV contrast (multihance)
Comparison: None.

CLINICAL DATA: Right arm pain and numbness

EXAM:
MRI CERVICAL SPINE WITHOUT AND WITH CONTRAST
TECHNIQUE: Multiplanar and multiecho pulse sequences of the cervical spine, to
include the craniocervical junction and cervicothoracic junction,
were obtained without and with intravenous contrast.
CONTRAST:  15mL MULTIHANCE GADOBENATE DIMEGLUMINE 529 MG/ML IV SOLN

[Series 3: T1 · sagittal · 3.0mm · 0.41mm/px · 4 of 12 slices shown (1 of 2)]
[im 1/12]
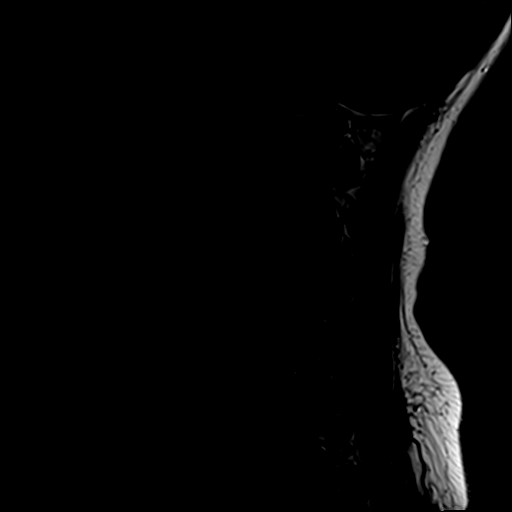
[im 4/12]
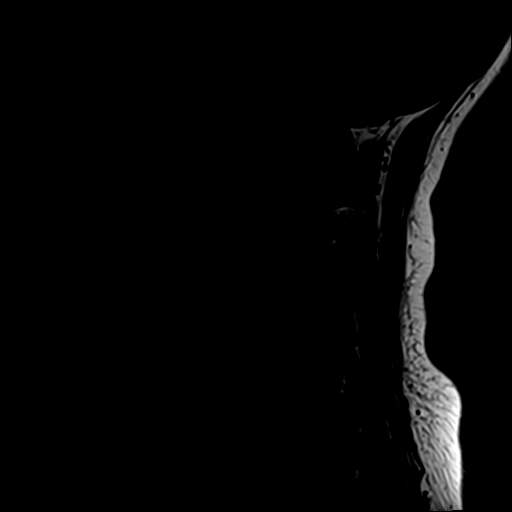
[im 8/12]
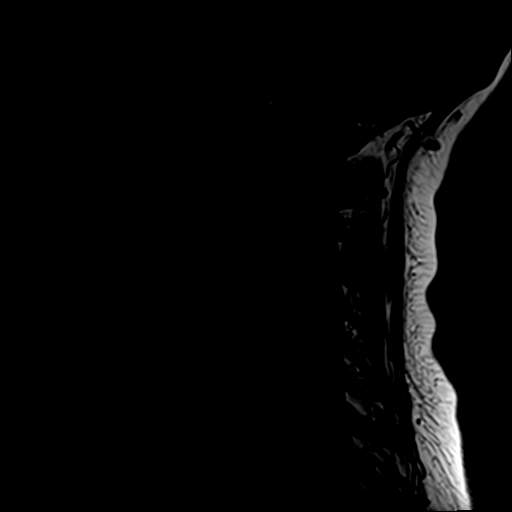
[im 12/12]
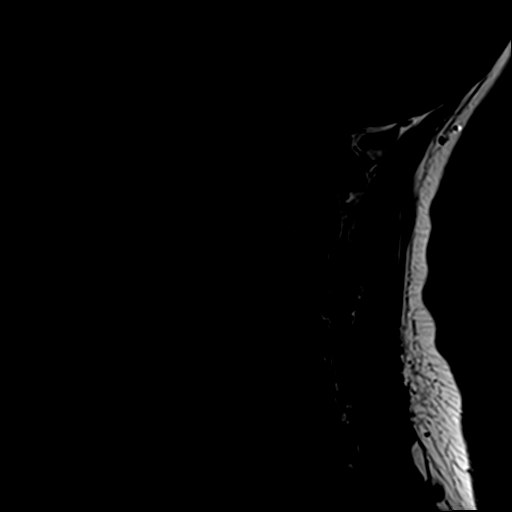

[Series 5: T2 · axial · 3.0mm · 0.70mm/px · z∈[-68,+23]mm · 8 of 25 slices shown (1 of 2)]
[im 1/25]
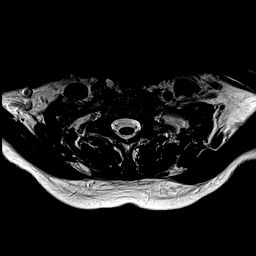
[im 4/25]
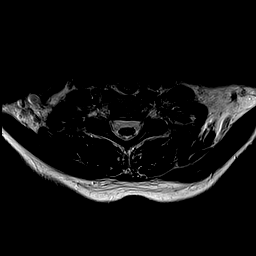
[im 7/25]
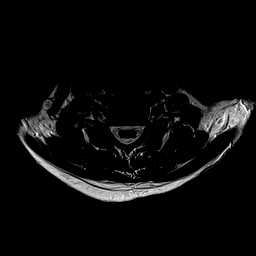
[im 11/25]
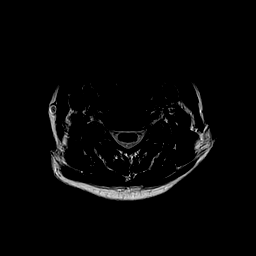
[im 14/25]
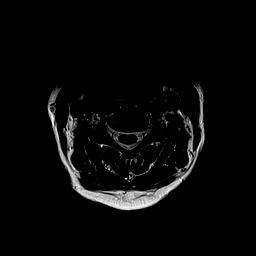
[im 18/25]
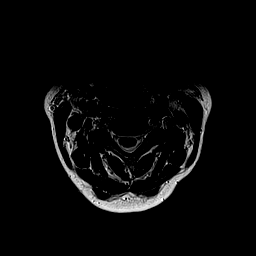
[im 21/25]
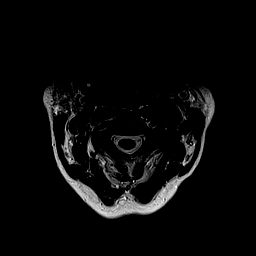
[im 25/25]
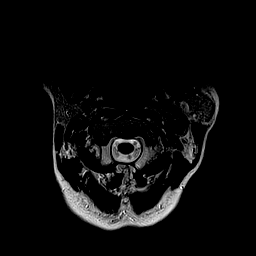

[Series 6: T1 · axial · 3.0mm · 0.35mm/px · z∈[-68,+23]mm · 8 of 25 slices shown (2 of 2)]
[im 1/25]
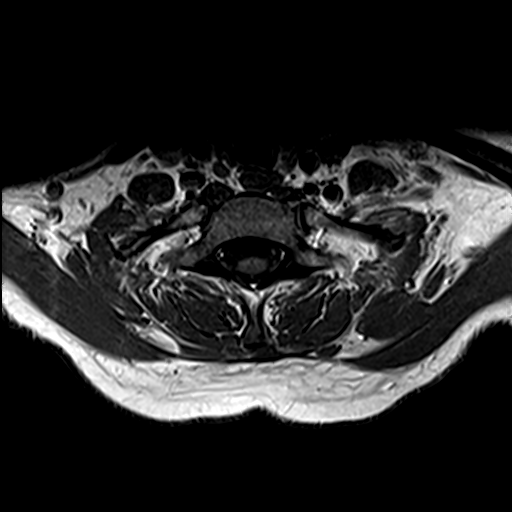
[im 4/25]
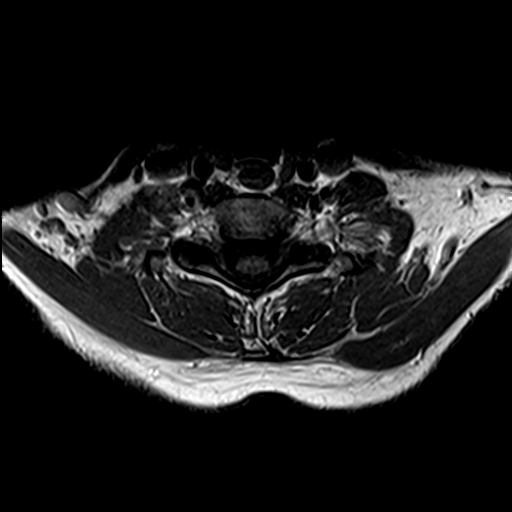
[im 7/25]
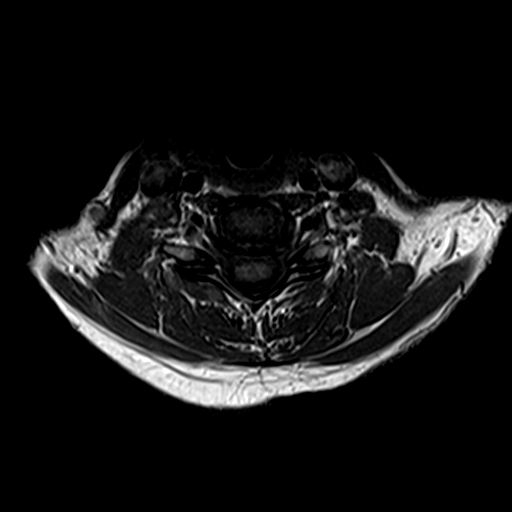
[im 11/25]
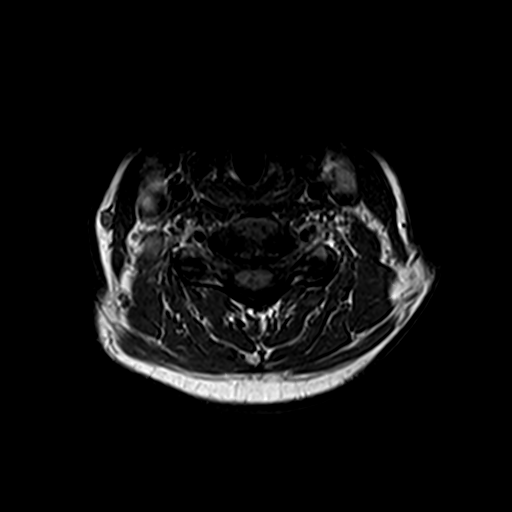
[im 14/25]
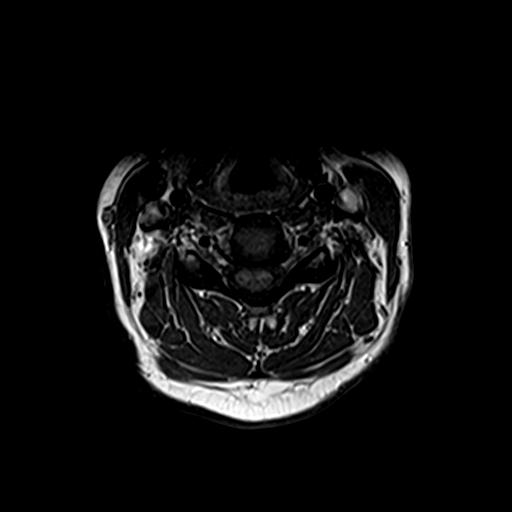
[im 18/25]
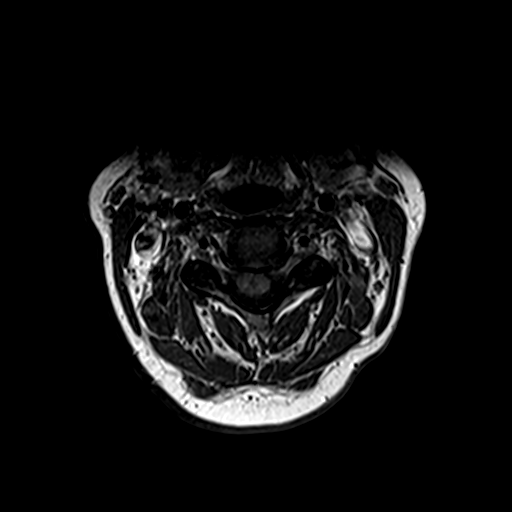
[im 21/25]
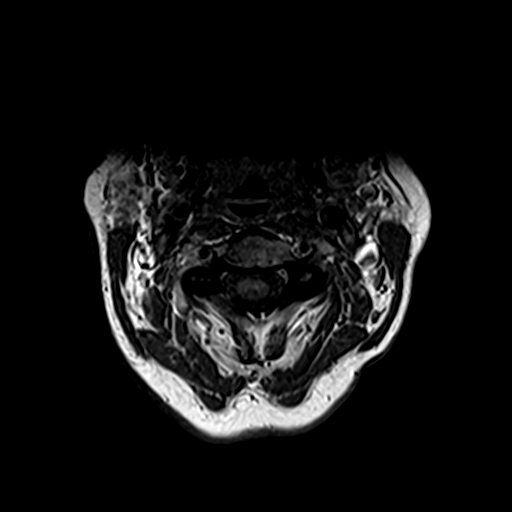
[im 25/25]
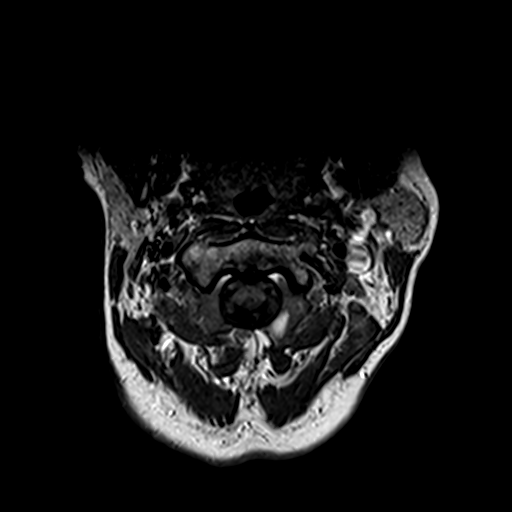

[Series 7: T2 · sagittal · 3.0mm · 0.41mm/px · 4 of 12 slices shown (2 of 2)]
[im 1/12]
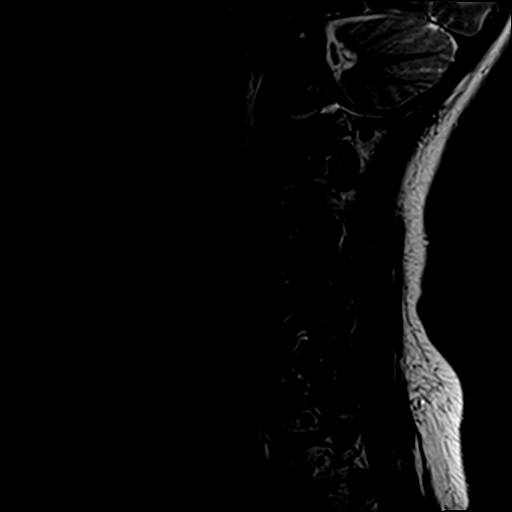
[im 4/12]
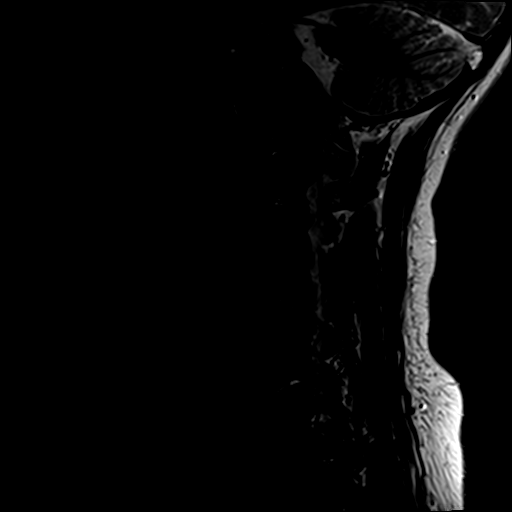
[im 8/12]
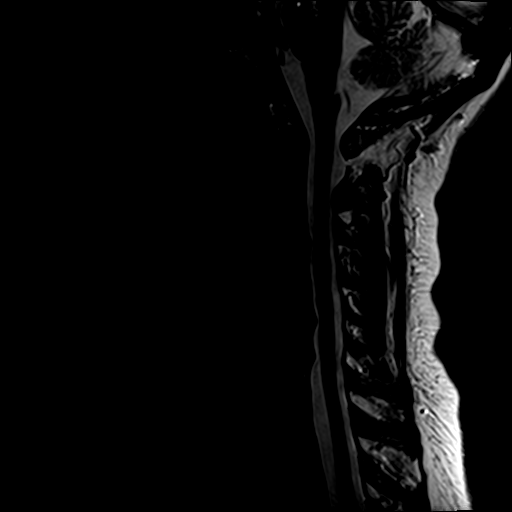
[im 12/12]
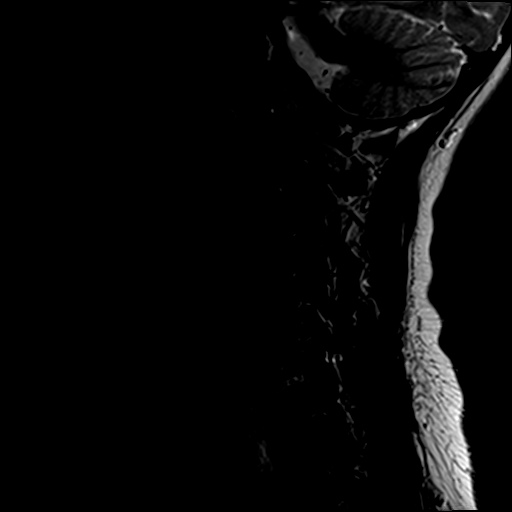

[Series 8: T1 fat-sat post-contrast · sagittal · 3.0mm · 0.82mm/px · 1 of 12 slices shown]
[im 1/12]
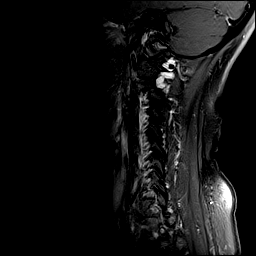

[25 of 48 positions shown; findings below may reference images not displayed]

FINDINGS: Alignment: Mild retrolisthesis C5-6 and C6-7. Straightening of the
cervical lordosis with slight kyphosis

Vertebrae: Normal bone marrow.  Negative for fracture or mass.

Cord: Normal signal and morphology

Posterior Fossa, vertebral arteries, paraspinal tissues: Lesion in
the upper pons measuring approximately 15 mm with heterogeneous
signal intensity. Focal areas of hypointensity and hyperintensity on
both T1 and T2. Findings suggestive of prior hemorrhage. Mild amount
of chronic hemorrhage left cerebellum. Findings suggestive of
vascular malformation in the pons.

Disc levels:

C2-3: Negative

C3-4: Mild disc degeneration and spurring without stenosis.

C4-5: Mild facet degeneration.  Negative for stenosis.

C5-6: Disc degeneration with diffuse mild uncinate spurring and mild
facet degeneration. Moderate foraminal stenosis bilaterally.

C6-7: Mild disc degeneration and spurring. Mild foraminal narrowing
bilaterally.

C7-T1: Negative
IMPRESSION: 15 mm lesion in the upper pons with evidence of prior hemorrhage.
Probable vascular malformation such as cavernoma or AVM. Smaller
area of chronic blood products in the left cerebellum. MRI brain
without and with contrast and MRA brain recommended for further
evaluation.

Cervical spondylosis as above. Moderate foraminal stenosis
bilaterally C5-6 and mild foraminal stenosis bilaterally at C6-7.

These results will be called to the ordering clinician or
representative by the Radiologist Assistant, and communication
documented in the PACS or zVision Dashboard.

## 2019-06-23 DIAGNOSIS — H5203 Hypermetropia, bilateral: Secondary | ICD-10-CM | POA: Diagnosis not present

## 2019-07-08 ENCOUNTER — Telehealth: Payer: 59 | Admitting: Emergency Medicine

## 2019-07-08 DIAGNOSIS — J329 Chronic sinusitis, unspecified: Secondary | ICD-10-CM

## 2019-07-08 MED ORDER — IPRATROPIUM BROMIDE 0.03 % NA SOLN: 2.0000 | Freq: Two times a day (BID) | NASAL | 0 refills | Status: DC

## 2019-07-08 MED ORDER — SALINE SPRAY 0.65 % NA SOLN
1.0000 | NASAL | 0 refills | Status: DC | PRN
Start: 2019-07-08 — End: 2021-12-07

## 2019-07-08 MED ORDER — AMOXICILLIN-POT CLAVULANATE 875-125 MG PO TABS: 1.0000 | ORAL_TABLET | Freq: Two times a day (BID) | ORAL | 0 refills | Status: DC

## 2019-07-08 MED FILL — AMOX-CLAV 875-125 MG TABLET: 875-125 | 7 days supply | Qty: 14 | Fill #0

## 2019-07-08 MED FILL — IPRATROPIUM 0.03% SPRAY: 0.03 | 43 days supply | Qty: 30 | Fill #0

## 2019-07-08 NOTE — Progress Notes (Signed)
We are sorry that you are not feeling well.  Here is how we plan to help!  Based on what you have shared with me it looks like you have sinusitis.  Sinusitis is inflammation and infection in the sinus cavities of the head.  Based on your presentation I believe you most likely have Acute Bacterial Sinusitis.  This is an infection caused by bacteria and is treated with antibiotics. I have prescribed Augmentin 875mg /125mg  one tablet twice daily with food, for 7 days. You may use an oral decongestant such as Mucinex D or if you have glaucoma or high blood pressure use plain Mucinex. Saline nasal spray help and can safely be used as often as needed for congestion.  If you develop worsening sinus pain, fever or notice severe headache and vision changes, or if symptoms are not better after completion of antibiotic, please schedule an appointment with a health care provider.    I've also sent 2 nose sprays to the pharmacy.  I know they aren't pleasant, but they should help with the congestion and allow you to sleep better.  Sinus infections are not as easily transmitted as other respiratory infection, however we still recommend that you avoid close contact with loved ones, especially the very young and elderly.  Remember to wash your hands thoroughly throughout the day as this is the number one way to prevent the spread of infection!  Home Care:  Only take medications as instructed by your medical team.  Complete the entire course of an antibiotic.  Do not take these medications with alcohol.  A steam or ultrasonic humidifier can help congestion.  You can place a towel over your head and breathe in the steam from hot water coming from a faucet.  Avoid close contacts especially the very young and the elderly.  Cover your mouth when you cough or sneeze.  Always remember to wash your hands.  Get Help Right Away If:  You develop worsening fever or sinus pain.  You develop a severe head ache or visual  changes.  Your symptoms persist after you have completed your treatment plan.  Make sure you  Understand these instructions.  Will watch your condition.  Will get help right away if you are not doing well or get worse.  Your e-visit answers were reviewed by a board certified advanced clinical practitioner to complete your personal care plan.  Depending on the condition, your plan could have included both over the counter or prescription medications.  If there is a problem please reply  once you have received a response from your provider.  Your safety is important to [place].  If you have drug allergies check your prescription carefully.    You can use MyChart to ask questions about today's visit, request a non-urgent call back, or ask for a work or school excuse for 24 hours related to this e-Visit. If it has been greater than 24 hours you will need to follow up with your provider, or enter a new e-Visit to address those concerns.  You will get an e-mail in the next two days asking about your experience.  I hope that your e-visit has been valuable and will speed your recovery. Thank you for using e-visits.   Approximately 5 minutes was used in reviewing the patient's chart, questionnaire, prescribing medications, and documentation.

## 2019-09-23 DIAGNOSIS — G4733 Obstructive sleep apnea (adult) (pediatric): Secondary | ICD-10-CM | POA: Diagnosis not present

## 2019-12-06 ENCOUNTER — Other Ambulatory Visit (INDEPENDENT_AMBULATORY_CARE_PROVIDER_SITE_OTHER): Payer: 59

## 2019-12-06 ENCOUNTER — Other Ambulatory Visit: Payer: Self-pay

## 2019-12-06 ENCOUNTER — Ambulatory Visit (INDEPENDENT_AMBULATORY_CARE_PROVIDER_SITE_OTHER): Payer: 59 | Admitting: Family Medicine

## 2019-12-06 ENCOUNTER — Encounter: Payer: Self-pay | Admitting: Family Medicine

## 2019-12-06 VITALS — BP 106/72 | HR 71 | Temp 98.5°F | Ht 66.0 in | Wt 154.0 lb

## 2019-12-06 DIAGNOSIS — K635 Polyp of colon: Secondary | ICD-10-CM

## 2019-12-06 DIAGNOSIS — Z130 Encounter for screening for diseases of the blood and blood-forming organs and certain disorders involving the immune mechanism: Secondary | ICD-10-CM

## 2019-12-06 DIAGNOSIS — Z9189 Other specified personal risk factors, not elsewhere classified: Secondary | ICD-10-CM

## 2019-12-06 DIAGNOSIS — E7849 Other hyperlipidemia: Secondary | ICD-10-CM

## 2019-12-06 DIAGNOSIS — E538 Deficiency of other specified B group vitamins: Secondary | ICD-10-CM | POA: Diagnosis not present

## 2019-12-06 DIAGNOSIS — Z79899 Other long term (current) drug therapy: Secondary | ICD-10-CM | POA: Diagnosis not present

## 2019-12-06 DIAGNOSIS — Z0000 Encounter for general adult medical examination without abnormal findings: Secondary | ICD-10-CM | POA: Diagnosis not present

## 2019-12-06 DIAGNOSIS — Z131 Encounter for screening for diabetes mellitus: Secondary | ICD-10-CM

## 2019-12-06 DIAGNOSIS — R7303 Prediabetes: Secondary | ICD-10-CM | POA: Diagnosis not present

## 2019-12-06 LAB — COMPREHENSIVE METABOLIC PANEL
ALT: 22 U/L (ref 0–35)
AST: 19 U/L (ref 0–37)
Albumin: 4.5 g/dL (ref 3.5–5.2)
Alkaline Phosphatase: 60 U/L (ref 39–117)
BUN: 13 mg/dL (ref 6–23)
CO2: 29 mEq/L (ref 19–32)
Calcium: 9.3 mg/dL (ref 8.4–10.5)
Chloride: 105 mEq/L (ref 96–112)
Creatinine, Ser: 0.75 mg/dL (ref 0.40–1.20)
GFR: 87.56 mL/min (ref >60.00)
Glucose, Bld: 101 mg/dL — ABNORMAL HIGH (ref 70–99)
Potassium: 3.8 mEq/L (ref 3.5–5.1)
Sodium: 141 mEq/L (ref 135–145)
Total Bilirubin: 0.8 mg/dL (ref 0.2–1.2)
Total Protein: 6.9 g/dL (ref 6.0–8.3)

## 2019-12-06 LAB — LIPID PANEL
Cholesterol: 150 mg/dL (ref 0–200)
HDL: 62.1 mg/dL (ref >39.00)
LDL Cholesterol: 76 mg/dL (ref 0–99)
NonHDL: 88.24
Total CHOL/HDL Ratio: 2
Triglycerides: 60 mg/dL (ref 0.0–149.0)
VLDL: 12 mg/dL (ref 0.0–40.0)

## 2019-12-06 LAB — CBC WITH DIFFERENTIAL/PLATELET
Basophils Absolute: 0.1 10*3/uL (ref 0.0–0.1)
Basophils Relative: 0.8 % (ref 0.0–3.0)
Eosinophils Absolute: 0.1 10*3/uL (ref 0.0–0.7)
Eosinophils Relative: 1.7 % (ref 0.0–5.0)
HCT: 43 % (ref 36.0–46.0)
Hemoglobin: 14.5 g/dL (ref 12.0–15.0)
Lymphocytes Relative: 32.2 % (ref 12.0–46.0)
Lymphs Abs: 2.3 10*3/uL (ref 0.7–4.0)
MCHC: 33.7 g/dL (ref 30.0–36.0)
MCV: 93.5 fl (ref 78.0–100.0)
Monocytes Absolute: 0.6 10*3/uL (ref 0.1–1.0)
Monocytes Relative: 7.8 % (ref 3.0–12.0)
Neutro Abs: 4.1 10*3/uL (ref 1.4–7.7)
Neutrophils Relative %: 57.5 % (ref 43.0–77.0)
Platelets: 211 10*3/uL (ref 150.0–400.0)
RBC: 4.6 Mil/uL (ref 3.87–5.11)
RDW: 13.4 % (ref 11.5–15.5)
WBC: 7.1 10*3/uL (ref 4.0–10.5)

## 2019-12-06 LAB — VITAMIN B12: Vitamin B-12: 398 pg/mL (ref 211–911)

## 2019-12-06 LAB — HEMOGLOBIN A1C: Hgb A1c MFr Bld: 6.2 % (ref 4.6–6.5)

## 2019-12-06 LAB — TSH: TSH: 1.79 u[IU]/mL (ref 0.35–4.50)

## 2019-12-06 NOTE — Patient Instructions (Addendum)
Health Maintenance, Female Adopting a healthy lifestyle and getting preventive care are important in promoting health and wellness. Ask your health care provider about:  The right schedule for you to have regular tests and exams.  Things you can do on your own to prevent diseases and keep yourself healthy. What should I know about diet, weight, and exercise? Eat a healthy diet   Eat a diet that includes plenty of vegetables, fruits, low-fat dairy products, and lean protein.  Do not eat a lot of foods that are high in solid fats, added sugars, or sodium. Maintain a healthy weight Body mass index (BMI) is used to identify weight problems. It estimates body fat based on height and weight. Your health care provider can help determine your BMI and help you achieve or maintain a healthy weight. Get regular exercise Get regular exercise. This is one of the most important things you can do for your health. Most adults should:  Exercise for at least 150 minutes each week. The exercise should increase your heart rate and make you sweat (moderate-intensity exercise).  Do strengthening exercises at least twice a week. This is in addition to the moderate-intensity exercise.  Spend less time sitting. Even light physical activity can be beneficial. Watch cholesterol and blood lipids Have your blood tested for lipids and cholesterol at 58 years of age, then have this test every 5 years. Have your cholesterol levels checked more often if:  Your lipid or cholesterol levels are high.  You are older than 58 years of age.  You are at high risk for heart disease. What should I know about cancer screening? Depending on your health history and family history, you may need to have cancer screening at various ages. This may include screening for:  Breast cancer.  Cervical cancer.  Colorectal cancer.  Skin cancer.  Lung cancer. What should I know about heart disease, diabetes, and high blood  pressure? Blood pressure and heart disease  High blood pressure causes heart disease and increases the risk of stroke. This is more likely to develop in people who have high blood pressure readings, are of African descent, or are overweight.  Have your blood pressure checked: ? Every 3-5 years if you are 18-39 years of age. ? Every year if you are 40 years old or older. Diabetes Have regular diabetes screenings. This checks your fasting blood sugar level. Have the screening done:  Once every three years after age 40 if you are at a normal weight and have a low risk for diabetes.  More often and at a younger age if you are overweight or have a high risk for diabetes. What should I know about preventing infection? Hepatitis B If you have a higher risk for hepatitis B, you should be screened for this virus. Talk with your health care provider to find out if you are at risk for hepatitis B infection. Hepatitis C Testing is recommended for:  Everyone born from 1945 through 1965.  Anyone with known risk factors for hepatitis C. Sexually transmitted infections (STIs)  Get screened for STIs, including gonorrhea and chlamydia, if: ? You are sexually active and are younger than 58 years of age. ? You are older than 58 years of age and your health care provider tells you that you are at risk for this type of infection. ? Your sexual activity has changed since you were last screened, and you are at increased risk for chlamydia or gonorrhea. Ask your health care provider if   you are at risk.  Ask your health care provider about whether you are at high risk for HIV. Your health care provider may recommend a prescription medicine to help prevent HIV infection. If you choose to take medicine to prevent HIV, you should first get tested for HIV. You should then be tested every 3 months for as long as you are taking the medicine. Pregnancy  If you are about to stop having your period (premenopausal) and  you may become pregnant, seek counseling before you get pregnant.  Take 400 to 800 micrograms (mcg) of folic acid every day if you become pregnant.  Ask for birth control (contraception) if you want to prevent pregnancy. Osteoporosis and menopause Osteoporosis is a disease in which the bones lose minerals and strength with aging. This can result in bone fractures. If you are 13 years old or older, or if you are at risk for osteoporosis and fractures, ask your health care provider if you should:  Be screened for bone loss.  Take a calcium or vitamin D supplement to lower your risk of fractures.  Be given hormone replacement therapy (HRT) to treat symptoms of menopause. Follow these instructions at home: Lifestyle  Do not use any products that contain nicotine or tobacco, such as cigarettes, e-cigarettes, and chewing tobacco. If you need help quitting, ask your health care provider.  Do not use street drugs.  Do not share needles.  Ask your health care provider for help if you need support or information about quitting drugs. Alcohol use  Do not drink alcohol if: ? Your health care provider tells you not to drink. ? You are pregnant, may be pregnant, or are planning to become pregnant.  If you drink alcohol: ? Limit how much you use to 0-1 drink a day. ? Limit intake if you are breastfeeding.  Be aware of how much alcohol is in your drink. In the [place] one drink equals one 12 oz bottle of beer (355 mL), one 5 oz glass of wine (148 mL), or one 1 oz glass of hard liquor (44 mL). General instructions  Schedule regular health, dental, and eye exams.  Stay current with your vaccines.  Tell your health care provider if: ? You often feel depressed. ? You have ever been abused or do not feel safe at home. Summary  Adopting a healthy lifestyle and getting preventive care are important in promoting health and wellness.  Follow your health care provider's instructions about healthy  diet, exercising, and getting tested or screened for diseases.  Follow your health care provider's instructions on monitoring your cholesterol and blood pressure. This information is not intended to replace advice given to you by your health care provider. Make sure you discuss any questions you have with your health care provider. Document Revised: 01/28/2018 Document Reviewed: 01/28/2018 Elsevier Patient Education  [place]  FODMAPs (fermentable oligosaccharides, disaccharides, monosaccharides, and polyols) are sugars that are hard for some people to digest. A low-FODMAP eating plan may help some people who have bowel (intestinal) diseases to manage their symptoms. This meal plan can be complicated to follow. Work with a diet and nutrition specialist (dietitian) to make a low-FODMAP eating plan that is right for you. A dietitian can make sure that you get enough nutrition from this diet. What are tips for following this plan? Reading food labels  Check labels for hidden FODMAPs such as: ? High-fructose syrup. ? Honey. ? Agave. ? Natural fruit flavors. ?  Onion or garlic powder.  Choose low-FODMAP foods that contain 3-4 grams of fiber per serving.  Check food labels for serving sizes. Eat only one serving at a time to make sure FODMAP levels stay low. Meal planning  Follow a low-FODMAP eating plan for up to 6 weeks, or as told by your health care provider or dietitian.  To follow the eating plan: 1. Eliminate high-FODMAP foods from your diet completely. 2. Gradually reintroduce high-FODMAP foods into your diet one at a time. Most people should wait a few days after introducing one high-FODMAP food before they introduce the next high-FODMAP food. Your dietitian can recommend how quickly you may reintroduce foods. 3. Keep a daily record of what you eat and drink, and make note of any symptoms that you have after eating. 4. Review your daily record  with a dietitian regularly. Your dietitian can help you identify which foods you can eat and which foods you should avoid. General tips  Drink enough fluid each day to keep your urine pale yellow.  Avoid processed foods. These often have added sugar and may be high in FODMAPs.  Avoid most dairy products, whole grains, and sweeteners.  Work with a dietitian to make sure you get enough fiber in your diet. Recommended foods Grains  Gluten-free grains, such as rice, oats, buckwheat, quinoa, corn, polenta, and millet. Gluten-free pasta, bread, or cereal. Rice noodles. Corn tortillas. Vegetables  Eggplant, zucchini, cucumber, peppers, green beans, Brussels sprouts, bean sprouts, lettuce, arugula, kale, Swiss chard, spinach, collard greens, bok choy, summer squash, potato, and tomato. Limited amounts of corn, carrot, and sweet potato. Green parts of scallions. Fruits  Bananas, oranges, lemons, limes, blueberries, raspberries, strawberries, grapes, cantaloupe, honeydew melon, kiwi, papaya, passion fruit, and pineapple. Limited amounts of dried cranberries, banana chips, and shredded coconut. Dairy  Lactose-free milk, yogurt, and kefir. Lactose-free cottage cheese and ice cream. Non-dairy milks, such as almond, coconut, hemp, and rice milk. Yogurts made of non-dairy milks. Limited amounts of goat cheese, brie, mozzarella, parmesan, swiss, and other hard cheeses. Meats and other protein foods  Unseasoned beef, pork, poultry, or fish. Eggs. Berniece Salines. Tofu (firm) and tempeh. Limited amounts of nuts and seeds, such as almonds, walnuts, [place] nuts, pecans, peanuts, pumpkin seeds, chia seeds, and sunflower seeds. Fats and oils  Butter-free spreads. Vegetable oils, such as olive, canola, and sunflower oil. Seasoning and other foods  Artificial sweeteners with names that do not end in "ol" such as aspartame, saccharine, and stevia. Maple syrup, white table sugar, raw sugar, brown sugar, and molasses.  Fresh basil, coriander, parsley, rosemary, and thyme. Beverages  Water and mineral water. Sugar-sweetened soft drinks. Small amounts of orange juice or cranberry juice. Black and green tea. Most dry wines. Coffee. This may not be a complete list of low-FODMAP foods. Talk with your dietitian for more information. Foods to avoid Grains  Wheat, including kamut, durum, and semolina. Barley and bulgur. Couscous. Wheat-based cereals. Wheat noodles, bread, crackers, and pastries. Vegetables  Chicory root, artichoke, asparagus, cabbage, snow peas, sugar snap peas, mushrooms, and cauliflower. Onions, garlic, leeks, and the white part of scallions. Fruits  Fresh, dried, and juiced forms of apple, pear, watermelon, peach, plum, cherries, apricots, blackberries, boysenberries, figs, nectarines, and mango. Avocado. Dairy  Milk, yogurt, ice cream, and soft cheese. Cream and sour cream. Milk-based sauces. Custard. Meats and other protein foods  Fried or fatty meat. Sausage. Cashews and pistachios. Soybeans, baked beans, black beans, chickpeas, kidney beans, fava beans, navy beans, lentils, and  split peas. Seasoning and other foods  Any sugar-free gum or candy. Foods that contain artificial sweeteners such as sorbitol, mannitol, isomalt, or xylitol. Foods that contain honey, high-fructose corn syrup, or agave. Bouillon, vegetable stock, beef stock, and chicken stock. Garlic and onion powder. Condiments made with onion, such as hummus, chutney, pickles, relish, salad dressing, and salsa. Tomato paste. Beverages  Chicory-based drinks. Coffee substitutes. Chamomile tea. Fennel tea. Sweet or fortified wines such as port or sherry. Diet soft drinks made with isomalt, mannitol, maltitol, sorbitol, or xylitol. Apple, pear, and mango juice. Juices with high-fructose corn syrup. This may not be a complete list of high-FODMAP foods. Talk with your dietitian to discuss what dietary choices are best for you.   Summary  A low-FODMAP eating plan is a short-term diet that eliminates FODMAPs from your diet to help ease symptoms of certain bowel diseases.  The eating plan usually lasts up to 6 weeks. After that, high-FODMAP foods are restarted gradually, one at a time, so you can find out which may be causing symptoms.  A low-FODMAP eating plan can be complicated. It is best to work with a dietitian who has experience with this type of plan. This information is not intended to replace advice given to you by your health care provider. Make sure you discuss any questions you have with your health care provider. Document Revised: 01/17/2017 Document Reviewed: 10/01/2016 Elsevier Patient Education  [place].

## 2019-12-06 NOTE — Progress Notes (Signed)
Patient ID: [name], female  DOB: [birth year], 59 y.o.   MRN: [id number] Patient Care Team    Relationship Specialty Notifications Start End  Ma Hillock, DO PCP - General Family Medicine  09/13/15   Earnstine Regal, PA-C Physician Assistant Obstetrics and Gynecology  04/04/15    Comment: [facility]  Haygood, Seymour Bars, MD (Inactive) Consulting Physician Obstetrics and Gynecology  04/13/15   Milus Banister, MD Attending Physician Gastroenterology  09/13/15   [place], Heather, [place]  Optometry  09/13/15   Waynetta Sandy, MD Consulting Physician Vascular Surgery  05/12/17   Mardelle Matte, MD Referring Physician Dermatology  12/01/18     Chief Complaint  Patient presents with  . Annual Exam    pt is fasting    Subjective:  LISSA ROWLES is a 59 y.o.  Female  present for CPE . All past medical history, surgical history, allergies, family history, immunizations, medications and social history were updated in the electronic medical record today. All recent labs, ED visits and hospitalizations within the last year were reviewed.  Health maintenance: updated  12/13/19  Completed 2021, by Dr. Ardis Hughs, resutls normal. follow up 5 yr (Fhx). Personal history of polyps at 23 yr.  Mammogram: completed: 11/2018, birads 1.At gyn office, reported normal. Cervical cancer screening: last pap: 11/2017, results: normal, completed by: Dr. Leo Grosser (GYN). Uterine polyp removed2017. Immunizations: tdap7/2017 utd, Influenza UTD receives through work.(encouraged yearly).  Shingrix completed 2020.  COVID-19 series completed-with booster Infectious disease screening:hiv and hep c completed DEXA: 06/13/2016--> normal, Fhx osteoporosis, estrogen deficient. Long term use of nexium. Vit D normal.rpt 2023. Assistive device: none Oxygen ZTI:WPYK Patient has a Dental home. Hospitalizations/ED visits: reviewed   Depression screen [facility] 2/9 12/06/2019 12/01/2018  08/13/2017 05/31/2016 09/13/2015  Decreased Interest 0 0 0 0 0  Down, Depressed, Hopeless 0 1 0 0 0  PHQ - 2 Score 0 1 0 0 0   No flowsheet data found.   Immunization History  Administered Date(s) Administered  . Influenza Split 10/28/2016  . Influenza Whole 11/19/2010  . Influenza,inj,quad, With Preservative 11/19/2014  . Influenza-Unspecified 11/19/2014, 11/08/2016, 11/05/2017  . PFIZER SARS-COV-2 Vaccination 02/08/2019, 02/26/2019, 11/19/2019  . Tdap 09/13/2015  . Zoster Recombinat (Shingrix) 12/16/2017, 04/20/2018    Past Medical History:  Diagnosis Date  . Ankle swelling    occasional   . Cavernoma 05/2017  . Colon polyps 07/25/2010  . Complication of anesthesia    urinary retention  . DUB (dysfunctional uterine bleeding) 2016/2017   GYN did endometrial bx (BENIGN PATH) + pap 04/11/15; u/s to follow  . Edema   . Endometrial polyp   . GERD (gastroesophageal reflux disease) 07/25/2010   with schatzki's ring on endo 07/2010  . History of recurrent UTIs 07/25/2010  . Melanoma ([place]) 2006   on right arm  . Miscarriage   . OSA on CPAP 10/02/2011  . Premenstrual migraine    headaches since bleeding started  . Schatzki's ring   . Seasonal allergic rhinitis   . SI (sacroiliac) joint inflammation ([place])   . Tachycardia    H/O transient  tach. as a chlid   . Umbilical hernia 10/27/8336  . Unspecified constipation 07/19/2012   Allergies  Allergen Reactions  . Anticoagulant Compound   . Asa [Aspirin]   . Nsaids    Past Surgical History:  Procedure Laterality Date  . COLONOSCOPY  02/2010   5 yr recall (FH colon cancer)  . DILATATION & CURRETTAGE/HYSTEROSCOPY  WITH RESECTOCOPE N/A 04/21/2015   Path: benign. Procedure: [place];  Surgeon: Eldred Manges, MD;  Location: [facility];  Service: Gynecology;  Laterality: N/A;  . DILATION AND CURETTAGE OF UTERUS  [phone]  . DILATION AND EVACUATION N/A 04/21/2015   Done for DUB/Menorrhagia.   Procedure: DILATATION AND EVACUATION;  Surgeon: Eldred Manges, MD;  Location: [facility];  Service: Gynecology;  Laterality: N/A;  . EXPLORATORY LAPAROTOMY    . HERNIA REPAIR     left inguinal  . MELANOMA EXCISION  2006   R arm  . oral surgeries  70's and 80's  . TONSILLECTOMY     childhood  . VENTRAL HERNIA REPAIR N/A 10/29/2012   Procedure: LAPAROSCOPIC VENTRAL HERNIA WITH MESH;  Surgeon: Shann Medal, MD;  Location: [facility];  Service: General;  Laterality: N/A;   Family History  Problem Relation Age of Onset  . Hyperlipidemia Mother   . Hypertension Mother   . Stroke Mother   . Cancer Father 68       ish, colon- remission/ skin cancer  . Colon cancer Father   . Other Brother        stent placed//AVM  . Hypertension Brother   . Hyperlipidemia Brother   . Heart disease Brother        2 cardiac stents  . Other Daughter        heart ablation/ heart arrythmia  . ADD / ADHD Daughter        ADD  . Heart disease Daughter 40       tachyarrythmia, scheduled for ablation   . ADD / ADHD Son        ADD  . Cancer Paternal Uncle        colon  . Diabetes Maternal Grandmother   . Stroke Maternal Grandmother   . Hypertension Maternal Grandmother   . Alzheimer's disease Maternal Grandmother   . Diabetes Maternal Grandfather   . Other Maternal Grandfather        cardiac disease  . Cancer Paternal Grandmother        colon  . Colon cancer Paternal Grandmother   . Allergy (severe) Brother        smoker  . Other Brother        respiratory problems  . Colon cancer Maternal Uncle   . Esophageal cancer Neg Hx   . Rectal cancer Neg Hx   . Stomach cancer Neg Hx    Social History   Social History Narrative   Married. Right handed.   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    Wears seatbelt. Smoke detector in the home.    Caffeine use: 1 cup coffee per day.     Allergies as of 12/06/2019      Reactions   Anticoagulant Compound    Asa [aspirin]    Nsaids       Medication List        Accurate as of December 06, 2019 11:59 PM. If you have any questions, ask your nurse or doctor.        STOP taking these medications   amoxicillin-clavulanate 875-125 MG tablet Commonly known as: AUGMENTIN Stopped by: Howard Pouch, DO   ipratropium 0.03 % nasal spray Commonly known as: ATROVENT Stopped by: Howard Pouch, DO     TAKE these medications   acetaminophen 500 MG tablet Commonly known as: TYLENOL Take 500 mg by mouth every 6 (six) hours as needed.   famotidine 20 MG tablet  Commonly known as: PEPCID Take 20 mg by mouth daily.   sodium chloride 0.65 % Soln nasal spray Commonly known as: OCEAN Place 1 spray into both nostrils as needed for congestion.   vitamin B-12 1000 MCG tablet Commonly known as: CYANOCOBALAMIN Take 1,000 mcg by mouth daily.       All past medical history, surgical history, allergies, family history, immunizations andmedications were updated in the EMR today and reviewed under the history and medication portions of their EMR.     Recent Results (from the past 2160 hour(s))  B12     Status: None   Collection Time: 12/06/19  9:44 AM  Result Value Ref Range   Vitamin B-12 398 211 - 911 pg/mL  CBC with Differential/Platelet     Status: None   Collection Time: 12/06/19  9:44 AM  Result Value Ref Range   WBC 7.1 4.0 - 10.5 K/uL   RBC 4.60 3.87 - 5.11 Mil/uL   Hemoglobin 14.5 12.0 - 15.0 g/dL   HCT 43.0 36 - 46 %   MCV 93.5 78.0 - 100.0 fl   MCHC 33.7 30.0 - 36.0 g/dL   RDW 13.4 11.5 - 15.5 %   Platelets 211.0 150 - 400 K/uL   Neutrophils Relative % 57.5 43 - 77 %   Lymphocytes Relative 32.2 12 - 46 %   Monocytes Relative 7.8 3 - 12 %   Eosinophils Relative 1.7 0 - 5 %   Basophils Relative 0.8 0 - 3 %   Neutro Abs 4.1 1.4 - 7.7 K/uL   Lymphs Abs 2.3 0.7 - 4.0 K/uL   Monocytes Absolute 0.6 0.1 - 1.0 K/uL   Eosinophils Absolute 0.1 0.0 - 0.7 K/uL   Basophils Absolute 0.1 0.0 - 0.1 K/uL  Comprehensive metabolic panel     Status: Abnormal    Collection Time: 12/06/19  9:44 AM  Result Value Ref Range   Sodium 141 135 - 145 mEq/L   Potassium 3.8 3.5 - 5.1 mEq/L   Chloride 105 96 - 112 mEq/L   CO2 29 19 - 32 mEq/L   Glucose, Bld 101 (H) 70 - 99 mg/dL   BUN 13 6 - 23 mg/dL   Creatinine, Ser 0.75 0.40 - 1.20 mg/dL   Total Bilirubin 0.8 0.2 - 1.2 mg/dL   Alkaline Phosphatase 60 39 - 117 U/L   AST 19 0 - 37 U/L   ALT 22 0 - 35 U/L   Total Protein 6.9 6.0 - 8.3 g/dL   Albumin 4.5 3.5 - 5.2 g/dL   GFR 87.56 >60.00 mL/min   Calcium 9.3 8.4 - 10.5 mg/dL  Hemoglobin A1c     Status: None   Collection Time: 12/06/19  9:44 AM  Result Value Ref Range   Hgb A1c MFr Bld 6.2 4.6 - 6.5 %    Comment: Glycemic Control Guidelines for People with Diabetes:Non Diabetic:  <6%Goal of Therapy: <7%Additional Action Suggested:  >8%   Lipid panel     Status: None   Collection Time: 12/06/19  9:44 AM  Result Value Ref Range   Cholesterol 150 0 - 200 mg/dL    Comment: ATP III Classification       Desirable:  < 200 mg/dL               Borderline High:  200 - 239 mg/dL          High:  > = 240 mg/dL   Triglycerides 60.0 0 - 149 mg/dL  Comment: Normal:  <150 mg/dLBorderline High:  150 - 199 mg/dL   HDL 62.10 >39.00 mg/dL   VLDL 12.0 0.0 - 40.0 mg/dL   LDL Cholesterol 76 0 - 99 mg/dL   Total CHOL/HDL Ratio 2     Comment:                Men          Women1/2 Average Risk     3.4          3.3Average Risk          5.0          4.42X Average Risk          9.6          7.13X Average Risk          15.0          11.0                       NonHDL 88.24     Comment: NOTE:  Non-HDL goal should be 30 mg/dL higher than patient's LDL goal (i.e. LDL goal of < 70 mg/dL, would have non-HDL goal of < 100 mg/dL)  TSH     Status: None   Collection Time: 12/06/19  9:44 AM  Result Value Ref Range   TSH 1.79 0.35 - 4.50 uIU/mL     ROS: 14 pt review of systems performed and negative (unless mentioned in an HPI)  Objective: BP 106/72   Pulse 71   Temp 98.5 F  (36.9 C) (Oral)   Ht 5\' 6"  (1.676 m)   Wt 154 lb (69.9 kg)   SpO2 99%   BMI 24.86 kg/m  Gen: Afebrile. No acute distress.  Nontoxic, pleasant, female HENT: AT. [place]. Bilateral TM visualized and normal in appearance. MMM. Bilateral nares without erythema or swelling. Throat without erythema or exudates.  No cough.  No hoarseness. Eyes:Pupils Equal Round Reactive to light, Extraocular movements intact,  Conjunctiva without redness, discharge or icterus. Neck/lymp/endocrine: Supple, no lymphadenopathy, no thyromegaly CV: RRR no murmur, no edema, +2/4 P posterior tibialis pulses Chest: CTAB, no wheeze or crackles Abd: Soft. NTND. BS present.  No masses palpated.  Skin: No rashes, purpura or petechiae.  Neuro/MSK:  Normal gait. PERLA. EOMi. Alert. Oriented x3  Psych: Normal affect, dress and demeanor. Normal speech. Normal thought content and judgment.  No exam data present  Assessment/plan: KIRI HINDERLITER is a 59 y.o. female present for CPE  hyperlipidemia Diet and exercise.  - Comprehensive metabolic panel; Future - Lipid panel; Future - TSH; Future Malignant melanoma, unspecified site [facility]) Follows with [facility] derm.  OSA on CPAP Follows w/ pulm At high risk for osteoporosis/Estrogen deficiency DEXA normal 2018. Rpt 5 yrs. > completes exam ncounter for long-term (current) use of medications - Comprehensive metabolic panel; Future B12 deficiency - B12; Future Screening for deficiency anemia - CBC with Differential/Platelet; Future Diabetes mellitus screening - Hemoglobin A1c; Future  Encounter for preventative adult health care examination Patient was encouraged to exercise greater than 150 minutes a week. Patient was encouraged to choose a diet filled with fresh fruits and vegetables, and lean meats. AVS provided to patient today for education/recommendation on gender specific health and safety maintenance. Colonoscopy due 2026   Mammogram: completed: 11/2018, birads 1.At gyn  office, reported normal. Cervical cancer screening: Up-to-date-completes a gynecological office Immunizations: Up-to-date Infectious disease screening:Up-to-date DEXA:.rpt 2023.   Return in about 1 year (around  12/05/2020) for CPE (30 min).  No orders of the defined types were placed in this encounter.  Orders Placed This Encounter  Procedures  . CBC with Differential/Platelet  . Comprehensive metabolic panel  . Hemoglobin A1c  . Lipid panel  . TSH  . B12    Electronically signed by: Howard Pouch, DO [place]

## 2019-12-13 ENCOUNTER — Encounter: Payer: Self-pay | Admitting: Family Medicine

## 2019-12-22 DIAGNOSIS — G4733 Obstructive sleep apnea (adult) (pediatric): Secondary | ICD-10-CM | POA: Diagnosis not present

## 2019-12-24 DIAGNOSIS — L814 Other melanin hyperpigmentation: Secondary | ICD-10-CM | POA: Diagnosis not present

## 2019-12-24 DIAGNOSIS — B079 Viral wart, unspecified: Secondary | ICD-10-CM | POA: Diagnosis not present

## 2019-12-24 DIAGNOSIS — L821 Other seborrheic keratosis: Secondary | ICD-10-CM | POA: Diagnosis not present

## 2019-12-24 DIAGNOSIS — D485 Neoplasm of uncertain behavior of skin: Secondary | ICD-10-CM | POA: Diagnosis not present

## 2019-12-24 DIAGNOSIS — D2260 Melanocytic nevi of unspecified upper limb, including shoulder: Secondary | ICD-10-CM | POA: Diagnosis not present

## 2020-01-21 DIAGNOSIS — C44311 Basal cell carcinoma of skin of nose: Secondary | ICD-10-CM | POA: Diagnosis not present

## 2020-01-21 DIAGNOSIS — Z85828 Personal history of other malignant neoplasm of skin: Secondary | ICD-10-CM | POA: Insufficient documentation

## 2020-02-09 DIAGNOSIS — N952 Postmenopausal atrophic vaginitis: Secondary | ICD-10-CM | POA: Diagnosis not present

## 2020-02-09 DIAGNOSIS — C44311 Basal cell carcinoma of skin of nose: Secondary | ICD-10-CM

## 2020-02-09 DIAGNOSIS — N898 Other specified noninflammatory disorders of vagina: Secondary | ICD-10-CM

## 2020-02-09 DIAGNOSIS — Z1231 Encounter for screening mammogram for malignant neoplasm of breast: Secondary | ICD-10-CM | POA: Diagnosis not present

## 2020-02-09 DIAGNOSIS — Z01411 Encounter for gynecological examination (general) (routine) with abnormal findings: Secondary | ICD-10-CM | POA: Diagnosis not present

## 2020-02-09 DIAGNOSIS — Z6823 Body mass index (BMI) 23.0-23.9, adult: Secondary | ICD-10-CM | POA: Diagnosis not present

## 2020-02-09 HISTORY — DX: Basal cell carcinoma of skin of nose: C44.311

## 2020-02-09 HISTORY — DX: Other specified noninflammatory disorders of vagina: N89.8

## 2020-02-10 ENCOUNTER — Other Ambulatory Visit: Payer: Self-pay

## 2020-02-15 DIAGNOSIS — C44311 Basal cell carcinoma of skin of nose: Secondary | ICD-10-CM | POA: Diagnosis not present

## 2020-05-01 DIAGNOSIS — Z20822 Contact with and (suspected) exposure to covid-19: Secondary | ICD-10-CM | POA: Diagnosis not present

## 2020-05-03 DIAGNOSIS — G4733 Obstructive sleep apnea (adult) (pediatric): Secondary | ICD-10-CM | POA: Diagnosis not present

## 2020-05-31 ENCOUNTER — Other Ambulatory Visit (HOSPITAL_COMMUNITY): Payer: Self-pay

## 2020-05-31 MED ORDER — CARESTART COVID-19 HOME TEST VI KIT
PACK | 0 refills | Status: DC
Start: 2020-05-31 — End: 2020-12-08
  Filled 2020-05-31: qty 2, 2d supply, fill #0

## 2020-06-01 ENCOUNTER — Other Ambulatory Visit (HOSPITAL_COMMUNITY): Payer: Self-pay

## 2020-06-15 ENCOUNTER — Other Ambulatory Visit (HOSPITAL_COMMUNITY): Payer: Self-pay

## 2020-06-23 DIAGNOSIS — L570 Actinic keratosis: Secondary | ICD-10-CM | POA: Diagnosis not present

## 2020-06-23 DIAGNOSIS — D1801 Hemangioma of skin and subcutaneous tissue: Secondary | ICD-10-CM | POA: Diagnosis not present

## 2020-06-23 DIAGNOSIS — L821 Other seborrheic keratosis: Secondary | ICD-10-CM | POA: Diagnosis not present

## 2020-06-23 DIAGNOSIS — B079 Viral wart, unspecified: Secondary | ICD-10-CM | POA: Diagnosis not present

## 2020-06-29 ENCOUNTER — Other Ambulatory Visit: Payer: Self-pay

## 2020-06-29 ENCOUNTER — Ambulatory Visit: Payer: 59 | Attending: Internal Medicine

## 2020-06-29 ENCOUNTER — Other Ambulatory Visit (HOSPITAL_BASED_OUTPATIENT_CLINIC_OR_DEPARTMENT_OTHER): Payer: Self-pay

## 2020-06-29 DIAGNOSIS — Z23 Encounter for immunization: Secondary | ICD-10-CM

## 2020-06-29 MED ORDER — PFIZER-BIONT COVID-19 VAC-TRIS 30 MCG/0.3ML IM SUSP
INTRAMUSCULAR | 0 refills | Status: DC
Start: 2020-06-29 — End: 2020-12-08
  Filled 2020-06-29: qty 0.3, 1d supply, fill #0

## 2020-06-29 NOTE — Progress Notes (Signed)
   Covid-19 Vaccination Clinic  Name:  Jennifer Sharp    MRN: [id number] DOB: 26-Oct-1960  06/29/2020  [name] was observed post Covid-19 immunization for 15 minutes without incident. She was provided with Vaccine Information Sheet and instruction to access the V-Safe system.   [name] was instructed to call 911 with any severe reactions post vaccine: Marland Kitchen Difficulty breathing  . Swelling of face and throat  . A fast heartbeat  . A bad rash all over body  . Dizziness and weakness   Immunizations Administered    Name Date Dose VIS Date Route   PFIZER Comrnaty(Gray TOP) Covid-19 Vaccine 06/29/2020  1:00 PM 0.3 mL 01/27/2020 Intramuscular   Manufacturer: [place]   Lot: [id number]   NDC: [phone]

## 2020-08-01 DIAGNOSIS — H5203 Hypermetropia, bilateral: Secondary | ICD-10-CM | POA: Diagnosis not present

## 2020-08-14 DIAGNOSIS — G4733 Obstructive sleep apnea (adult) (pediatric): Secondary | ICD-10-CM | POA: Diagnosis not present

## 2020-09-18 ENCOUNTER — Other Ambulatory Visit (HOSPITAL_COMMUNITY): Payer: Self-pay

## 2020-09-18 MED ORDER — CARESTART COVID-19 HOME TEST VI KIT
PACK | 0 refills | Status: DC
Start: 2020-09-18 — End: 2021-12-07
  Filled 2020-09-18: qty 4, 4d supply, fill #0

## 2020-09-26 DIAGNOSIS — C44311 Basal cell carcinoma of skin of nose: Secondary | ICD-10-CM | POA: Diagnosis not present

## 2020-09-28 DIAGNOSIS — M25511 Pain in right shoulder: Secondary | ICD-10-CM | POA: Diagnosis not present

## 2020-09-28 DIAGNOSIS — M5032 Other cervical disc degeneration, mid-cervical region, unspecified level: Secondary | ICD-10-CM | POA: Diagnosis not present

## 2020-09-29 ENCOUNTER — Other Ambulatory Visit (HOSPITAL_COMMUNITY): Payer: Self-pay | Admitting: Orthopedic Surgery

## 2020-09-29 ENCOUNTER — Other Ambulatory Visit: Payer: Self-pay | Admitting: Orthopedic Surgery

## 2020-09-29 DIAGNOSIS — M25511 Pain in right shoulder: Secondary | ICD-10-CM

## 2020-09-29 DIAGNOSIS — M542 Cervicalgia: Secondary | ICD-10-CM

## 2020-10-02 ENCOUNTER — Ambulatory Visit (HOSPITAL_COMMUNITY)
Admission: RE | Admit: 2020-10-02 | Discharge: 2020-10-02 | Disposition: A | Discharge status: Home / Self Care | Payer: 59 | Source: Ambulatory Visit | Attending: Orthopedic Surgery | Admitting: Orthopedic Surgery

## 2020-10-02 ENCOUNTER — Other Ambulatory Visit: Payer: Self-pay

## 2020-10-02 DIAGNOSIS — M542 Cervicalgia: Secondary | ICD-10-CM | POA: Insufficient documentation

## 2020-10-02 DIAGNOSIS — M47812 Spondylosis without myelopathy or radiculopathy, cervical region: Secondary | ICD-10-CM | POA: Diagnosis not present

## 2020-10-02 DIAGNOSIS — R200 Anesthesia of skin: Secondary | ICD-10-CM | POA: Diagnosis not present

## 2020-10-02 DIAGNOSIS — M50223 Other cervical disc displacement at C6-C7 level: Secondary | ICD-10-CM | POA: Diagnosis not present

## 2020-10-02 DIAGNOSIS — M19011 Primary osteoarthritis, right shoulder: Secondary | ICD-10-CM | POA: Diagnosis not present

## 2020-10-02 DIAGNOSIS — M25511 Pain in right shoulder: Secondary | ICD-10-CM | POA: Insufficient documentation

## 2020-10-02 DIAGNOSIS — M5021 Other cervical disc displacement,  high cervical region: Secondary | ICD-10-CM | POA: Diagnosis not present

## 2020-10-02 DIAGNOSIS — M50222 Other cervical disc displacement at C5-C6 level: Secondary | ICD-10-CM | POA: Diagnosis not present

## 2020-10-02 DIAGNOSIS — R202 Paresthesia of skin: Secondary | ICD-10-CM | POA: Diagnosis not present

## 2020-10-12 DIAGNOSIS — M542 Cervicalgia: Secondary | ICD-10-CM | POA: Diagnosis not present

## 2020-10-12 DIAGNOSIS — M25511 Pain in right shoulder: Secondary | ICD-10-CM | POA: Diagnosis not present

## 2020-10-12 DIAGNOSIS — M5032 Other cervical disc degeneration, mid-cervical region, unspecified level: Secondary | ICD-10-CM | POA: Diagnosis not present

## 2020-10-19 DIAGNOSIS — M5010 Cervical disc disorder with radiculopathy, unspecified cervical region: Secondary | ICD-10-CM | POA: Diagnosis not present

## 2020-10-20 ENCOUNTER — Encounter (HOSPITAL_BASED_OUTPATIENT_CLINIC_OR_DEPARTMENT_OTHER): Payer: Self-pay | Admitting: Physical Therapy

## 2020-10-20 ENCOUNTER — Other Ambulatory Visit: Payer: Self-pay

## 2020-10-20 ENCOUNTER — Ambulatory Visit (HOSPITAL_BASED_OUTPATIENT_CLINIC_OR_DEPARTMENT_OTHER): Payer: 59 | Attending: Orthopedic Surgery | Admitting: Physical Therapy

## 2020-10-20 DIAGNOSIS — M6281 Muscle weakness (generalized): Secondary | ICD-10-CM | POA: Insufficient documentation

## 2020-10-20 DIAGNOSIS — M542 Cervicalgia: Secondary | ICD-10-CM | POA: Diagnosis not present

## 2020-10-20 NOTE — Patient Instructions (Signed)
Access Code: [email or web address] URL: [email or web address] Date: 10/20/2020 Prepared by: Daleen Bo  Exercises Seated Cervical Retraction - 3 x daily - 7 x weekly - 1 sets - 10 reps - 3 hold Shoulder External Rotation and Scapular Retraction with Resistance - 2 x daily - 7 x weekly - 2 sets - 10 reps Gentle Levator Scapulae Stretch - 2 x daily - 7 x weekly - 1 sets - 3 reps - 30 hold

## 2020-10-20 NOTE — Therapy (Signed)
[facility] [address] [place], [place], [address] Phone: [phone]   Fax:  [phone]  Physical Therapy Evaluation  Patient Details  Name: Jennifer Sharp MRN: [id number] Date of Birth: [birth year] Referring Provider (PT): Netta Cedars, MD   Encounter Date: 10/20/2020   PT End of Session - 10/20/20 1034     Visit Number 1    Number of Visits 13    Date for PT Re-Evaluation 01/18/21    Authorization Type [employer]    PT Start Time [phone]    PT Stop Time 0935    PT Time Calculation (min) 50 min    Activity Tolerance Patient tolerated treatment well;Patient limited by pain    Behavior During Therapy [facility] for tasks assessed/performed             Past Medical History:  Diagnosis Date   Ankle swelling    occasional    Cavernoma 05/2017   Colon polyps [id number]   Complication of anesthesia    urinary retention   DUB (dysfunctional uterine bleeding) 2016/2017   GYN did endometrial bx (BENIGN PATH) + pap 04/11/15; u/s to follow   Edema    Endometrial polyp    GERD (gastroesophageal reflux disease) 07/25/2010   with schatzki's ring on endo 07/2010   History of recurrent UTIs 07/25/2010   Melanoma ([place]) 2006   on right arm   Miscarriage    OSA on CPAP 10/02/2011   Premenstrual migraine    headaches since bleeding started   Schatzki's ring    Seasonal allergic rhinitis    SI (sacroiliac) joint inflammation (HCC)    Tachycardia    H/O transient  tach. as a chlid    Umbilical hernia [id number]   Unspecified constipation 07/19/2012    Past Surgical History:  Procedure Laterality Date   COLONOSCOPY  02/2010   5 yr recall (FH colon cancer)   DILATATION & CURRETTAGE/HYSTEROSCOPY WITH RESECTOCOPE N/A 04/21/2015   Path: benign. Procedure: [place];  Surgeon: Eldred Manges, MD;  Location: [facility];  Service: Gynecology;  Laterality: N/A;   DILATION AND CURETTAGE OF UTERUS   [id number]   DILATION AND EVACUATION N/A 04/21/2015   Done for DUB/Menorrhagia.  Procedure: DILATATION AND EVACUATION;  Surgeon: Eldred Manges, MD;  Location: [facility];  Service: Gynecology;  Laterality: N/A;   EXPLORATORY LAPAROTOMY     HERNIA REPAIR     left inguinal   MELANOMA EXCISION  2006   R arm   oral surgeries  70's and 80's   TONSILLECTOMY     childhood   VENTRAL HERNIA REPAIR N/A 10/29/2012   Procedure: LAPAROSCOPIC VENTRAL HERNIA WITH MESH;  Surgeon: Shann Medal, MD;  Location: [facility];  Service: General;  Laterality: N/A;    There were no vitals filed for this visit.    Subjective Assessment - 10/20/20 0855     Subjective Pt states the neck pain started back up April or May of this year. She does not know what flared her up. She states it could have potentially been from cleaning more in order to sell her house. She states she has had this issue for 4-5 years now with the most recent exacerbation being the worst. She states she had more shoulder pain and radiating pain down the lateral aspect of hte arm. She has NT in the 3rd-5th digits and thumb.  She "absolutely wants to avoid surgery." She states she has a shoulder abnormality  that causes her R shoulder to come forward. She states the clavicles does not attach. She feels it has gotten worse as she has aged. Agg: computer work with arms out front, overuse; Eases: heat, massaging, not sleeping on that shoulder. She is a side sleeper. Current 3/10, Worst 9/10, Best 1/10. Pt states she feels sometimes her hands feel weaker and not able to hold on. She states she had NCV and it was not carpal tunnel. She states the R arm will sometimes feel heavy and the hand will be more pale. Pt states she has occasional headaches. She states one time while turning her head to check traffic and she felt a shooting pain down the R arm with a wave of nausea. It has happened 3-4 within the last 3-4 months. Pt denies cancer flags.    Limitations  Reading;Sitting;House hold activities;Writing    Diagnostic tests C5-6: Mild broad-based disc bulge. Bilateral uncovertebral  degenerative changes. Severe bilateral foraminal stenosis. No  central canal stenosis.     C6-7: Mild broad-based disc bulge. Bilateral uncovertebral  degenerative changes. Moderate bilateral foraminal stenosis. No  central canal stenosis.    Patient Stated Goals Biggest goal is to prevent surgery    Currently in Pain? Yes    Pain Score 3     Pain Location Arm    Pain Orientation Right    Pain Descriptors / Indicators Tightness;Numbness;Shooting    Pain Type Chronic pain    Pain Onset More than a month ago    Pain Frequency Constant                [facility] PT Assessment - 10/20/20 0001       Assessment   Medical Diagnosis M54.2 (ICD-10-CM) - Cervicalgia    Referring Provider (PT) Netta Cedars, MD    Hand Dominance Right    Prior Therapy Neck and R shoulder      Precautions   Precautions None      Restrictions   Weight Bearing Restrictions No      Balance Screen   Has the patient fallen in the past 6 months No      [place] residence      Prior Function   Level of Independence Independent      Cognition   Overall Cognitive Status Within Functional Limits for tasks assessed      Observation/Other Assessments   Other Surveys  Quick Dash    Quick DASH  34.1 / 100 = 34.1 %      Sensation   Light Touch Impaired by gross assessment      Coordination   Gross Motor Movements are Fluid and Coordinated Yes      Posture/Postural Control   Posture/Postural Control Postural limitations    Postural Limitations Rounded Shoulders      ROM / Strength   AROM / PROM / Strength AROM;PROM;Strength      AROM   Overall AROM Comments C/[facility]; pain with R rotation, R SB,  and ext; Shoulder ROM WNL but painful with BHB reaching      Strength   Overall Strength Comments R shoulder 4/5 throughout; fatiguing weakness in  C6 and C8; Grip dyno: L 45lbs, 38, 35; R 50lb, 45, 30lb, 38      Palpation   Palpation comment signficant TTP in R UT, levator, and cervical paraspinals, mild cervical R UPA restiction on R      Special Tests    Special Tests  Cervical    Cervical Tests Spurling's;Dictraction      Spurling's   Findings Positive      Distraction Test   Findngs Positive                        Objective measurements completed on examination: See above findings.       [facility] Adult PT Treatment/Exercise - 10/20/20 0001       Exercises   Exercises Neck      Neck Exercises: Standing   Neck Retraction Limitations supine and standing 3s 10x    Other Standing Exercises bilat shoulder ER RTB 2x10      Neck Exercises: Stretches   Levator Stretch Limitations 30s 3x                    PT Education - 10/20/20 1034     Education Details MOI, diagnosis, prognosis, anatomy, exercise progression, DOMS expectations, muscle firing, HEP, POC    Person(s) Educated Patient    Methods Explanation;Demonstration;Tactile cues;Verbal cues;Handout    Comprehension Verbalized understanding;Returned demonstration              PT Short Term Goals - 10/20/20 1041       PT SHORT TERM GOAL #1   Title Pt will become independent with HEP in order to demonstrate synthesis of PT education.    Time 2    Period Weeks    Status New      PT SHORT TERM GOAL #2   Title Pt will be able to demonstrate full BHB reaching without pain in order to demonstrate functional improvement in UE function for self-care and house hold duties.    Time 3    Period Weeks    Status New      PT SHORT TERM GOAL #3   Title Pt will be able to demonstrate/report centralization of symptoms away from the digits in order to demonstrate functional improvement in UE sensation and function.    Time 3    Period Weeks    Status New               PT Long Term Goals - 10/20/20 1043       PT LONG TERM GOAL #1    Title Pt  will become independent with final HEP in order to demonstrate synthesis of PT education.    Time 6    Period Weeks    Status New      PT LONG TERM GOAL #2   Title Pt will be able to reach [facility] and grab/reach/hold >5 lbs in order to demonstrate functional improvement in R UE function.    Time 6    Period Weeks    Status New      PT LONG TERM GOAL #3   Title Pt will have an at least 16 pt improvement in Quick DASH measure in order to demonstrate MCID improvement in daily function.    Time 6    Period Weeks    Status New      PT LONG TERM GOAL #4   Title Pt will be able to reach [facility] and grab/reach/hold >5 lbs without pain  in order to demonstrate functional improvement in R UE function for ADL simulated activity.    Time 6    Period Weeks    Status New                    Plan -  10/20/20 1035     Clinical Impression Statement Pt is a 60 y.o. female presenting to PT eval today for CC of neck pain and R arm pain. Pt presents with signficant muscle spasm of R UT and posterior cuff, muscle weakness, and paresthesia. Pt's s/s appear consistent with cervical radiculopathy and are consistent with MR imaging showing foraminal stenosis at lower C/S. Pt's pain is mildely irritable with movement but neck and shoulder pain is highly sensitive to touch. Pt is highly motivated and willing to participate with therapy in order to avoid surgical intervention. Pt's impairment limits their participation with ADL, exercise, and daily mobility. Pt would benefit from continued skilled therapy in order to reach goals and maximize functional R UE strength and function for full return to PLOF. Pt to be out of town for the next 2 weeks on vacation and will resume POC when she returns.    Personal Factors and Comorbidities Comorbidity 1;Time since onset of injury/illness/exacerbation;Age;Other    Examination-Activity Limitations Reach Overhead;Carry;Lift;Dressing;Other    Examination-Participation  Restrictions Occupation;Cleaning;Community Activity;Driving;Laundry;Yard Work;Shop;Other    Stability/Clinical Decision Making Evolving/Moderate complexity    Clinical Decision Making Moderate    Rehab Potential Good    PT Frequency 2x / week    PT Duration 6 weeks    PT Treatment/Interventions ADLs/Self Care Home Management;Aquatic Therapy;Electrical Stimulation;Iontophoresis '4mg'$ /ml Dexamethasone;Moist Heat;Traction;Ultrasound;Gait training;Stair training;Functional mobility training;Therapeutic activities;Therapeutic exercise;Balance training;Neuromuscular re-education;Patient/family education;Manual techniques;Dry needling;Passive range of motion;Taping;Joint Manipulations;Spinal Manipulations    PT Next Visit Plan Review HEP, manual traction, joint mobs,  subocc and UT STM, posterior cuff STM, rowing    PT Home Exercise Plan Access Code: [email or web address]    Consulted and Agree with Plan of Care Patient             Patient will benefit from skilled therapeutic intervention in order to improve the following deficits and impairments:  Decreased range of motion, Impaired UE functional use, Increased muscle spasms, Pain, Impaired sensation, Decreased strength, Decreased mobility, Hypomobility, Impaired flexibility, Improper body mechanics, Postural dysfunction, Decreased activity tolerance, Decreased endurance (decreased due to pain)  Visit Diagnosis: Cervicalgia  Muscle weakness (generalized)     Problem List Patient Active Problem List   Diagnosis Date Noted   Cyst of vagina 02/09/2020   Basal cell carcinoma of nose 02/09/2020   Personal history of other malignant neoplasm of skin 01/21/2020   Brain vascular malformation 05/12/2017   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   Prediabetes 06/03/2016   At high risk for osteoporosis 05/31/2016   Estrogen deficiency 05/31/2016   Encounter for long-term (current) use of medications 09/13/2015   B12 deficiency 09/13/2015    Osteoarthritis of sternoclavicular joint 05/17/2013   Slow transit constipation 07/19/2012   OSA on CPAP 10/02/2011   Encounter for general adult medical examination with abnormal findings 06/20/2011   GERD (gastroesophageal reflux disease) 07/25/2010   Colon polyps 123XX123   Umbilical hernia [id number]   Melanoma ([place])    Hyperlipidemia    Premenstrual migraine     Daleen Bo PT, DPT 10/20/20 10:50 AM   [facility] [address] [place], [place], [address] Phone: [phone]   Fax:  [phone]  Name: Jennifer Sharp MRN: [id number] Date of Birth: November 05, 1960

## 2020-11-01 ENCOUNTER — Encounter (HOSPITAL_BASED_OUTPATIENT_CLINIC_OR_DEPARTMENT_OTHER): Payer: Self-pay | Admitting: Physical Therapy

## 2020-11-09 ENCOUNTER — Encounter (HOSPITAL_BASED_OUTPATIENT_CLINIC_OR_DEPARTMENT_OTHER): Payer: Self-pay | Admitting: Physical Therapy

## 2020-11-09 ENCOUNTER — Ambulatory Visit (HOSPITAL_BASED_OUTPATIENT_CLINIC_OR_DEPARTMENT_OTHER): Payer: 59 | Admitting: Physical Therapy

## 2020-11-09 ENCOUNTER — Other Ambulatory Visit: Payer: Self-pay

## 2020-11-09 DIAGNOSIS — M542 Cervicalgia: Secondary | ICD-10-CM

## 2020-11-09 DIAGNOSIS — M6281 Muscle weakness (generalized): Secondary | ICD-10-CM

## 2020-11-09 NOTE — Patient Instructions (Signed)
Access Code: [email or web address] URL: [email or web address] Date: 11/09/2020 Prepared by: Daleen Bo  Exercises Seated Cervical Retraction - 3 x daily - 7 x weekly - 1 sets - 10 reps - 3 hold Shoulder External Rotation and Scapular Retraction with Resistance - 2 x daily - 7 x weekly - 2 sets - 10 reps Gentle Levator Scapulae Stretch - 2 x daily - 7 x weekly - 1 sets - 3 reps - 30 hold Standing Shoulder Row with Anchored Resistance - 2 x daily - 7 x weekly - 2 sets - 10 reps Median Nerve Flossing - Tray - 1 x daily - 7 x weekly - 2 sets - 10 reps

## 2020-11-09 NOTE — Therapy (Signed)
[facility] [address] [place], [place], [address] Phone: [phone]   Fax:  [phone]  Physical Therapy Treatment  Patient Details  Name: Jennifer Sharp MRN: [id number] Date of Birth: 1961-01-05 Referring Provider (PT): Netta Cedars, MD   Encounter Date: 11/09/2020   PT End of Session - 11/09/20 1654     Visit Number 2    Number of Visits 13    Date for PT Re-Evaluation 01/18/21    Authorization Type [employer]    PT Start Time 9937    PT Stop Time 1735    PT Time Calculation (min) 45 min    Activity Tolerance Patient tolerated treatment well;Patient limited by pain    Behavior During Therapy [facility] for tasks assessed/performed             Past Medical History:  Diagnosis Date   Ankle swelling    occasional    Cavernoma 05/2017   Colon polyps 02/24/9676   Complication of anesthesia    urinary retention   DUB (dysfunctional uterine bleeding) 2016/2017   GYN did endometrial bx (BENIGN PATH) + pap 04/11/15; u/s to follow   Edema    Endometrial polyp    GERD (gastroesophageal reflux disease) 07/25/2010   with schatzki's ring on endo 07/2010   History of recurrent UTIs 07/25/2010   Melanoma ([place]) 2006   on right arm   Miscarriage    OSA on CPAP 10/02/2011   Premenstrual migraine    headaches since bleeding started   Schatzki's ring    Seasonal allergic rhinitis    SI (sacroiliac) joint inflammation (HCC)    Tachycardia    H/O transient  tach. as a chlid    Umbilical hernia 10/21/8099   Unspecified constipation 07/19/2012    Past Surgical History:  Procedure Laterality Date   COLONOSCOPY  02/2010   5 yr recall (FH colon cancer)   DILATATION & CURRETTAGE/HYSTEROSCOPY WITH RESECTOCOPE N/A 04/21/2015   Path: benign. Procedure: [place];  Surgeon: Eldred Manges, MD;  Location: [facility];  Service: Gynecology;  Laterality: N/A;   DILATION AND CURETTAGE OF UTERUS   [phone]   DILATION AND EVACUATION N/A 04/21/2015   Done for DUB/Menorrhagia.  Procedure: DILATATION AND EVACUATION;  Surgeon: Eldred Manges, MD;  Location: [facility];  Service: Gynecology;  Laterality: N/A;   EXPLORATORY LAPAROTOMY     HERNIA REPAIR     left inguinal   MELANOMA EXCISION  2006   R arm   oral surgeries  70's and 80's   TONSILLECTOMY     childhood   VENTRAL HERNIA REPAIR N/A 10/29/2012   Procedure: LAPAROSCOPIC VENTRAL HERNIA WITH MESH;  Surgeon: Shann Medal, MD;  Location: [facility];  Service: General;  Laterality: N/A;    There were no vitals filed for this visit.   Subjective Assessment - 11/09/20 1651     Subjective Pt states the neck feels better. She states the neck pains intensity is down. Pt reports say NT into hand as previously noted.    Limitations Reading;Sitting;House hold activities;Writing    Diagnostic tests C5-6: Mild broad-based disc bulge. Bilateral uncovertebral  degenerative changes. Severe bilateral foraminal stenosis. No  central canal stenosis.     C6-7: Mild broad-based disc bulge. Bilateral uncovertebral  degenerative changes. Moderate bilateral foraminal stenosis. No  central canal stenosis.    Patient Stated Goals Biggest goal is to prevent surgery    Currently in Pain? Yes  Pain Score 2     Pain Location Arm    Pain Orientation Right    Pain Descriptors / Indicators Tightness;Numbness;Shooting    Pain Onset More than a month ago                               [facility] Adult PT Treatment/Exercise - 11/09/20 0001       Posture/Postural Control   Posture/Postural Control Postural limitations    Postural Limitations Rounded Shoulders      Exercises   Exercises Neck      Neck Exercises: Standing   Neck Retraction Limitations Median n. Glide 15x 2s hold   Other Standing Exercises bilat shoulder ER RTB 2x10   with scap retraction   Other Standing Exercises blue TB rowing 3x10 with half chin tuck      Neck  Exercises: Seated   Neck Retraction Limitations 15x 2s      Neck Exercises: Stretches   Levator Stretch Limitations 30s 3x      Manual Therapy   Manual Therapy Joint mobilization;Soft tissue mobilization    Joint Mobilization Cervical manual traction to tolerance 42min    Soft tissue mobilization R UT, levator, infra                     PT Education - 11/09/20 1804     Education Details anatomy, exercise progression, DOMS expectations, muscle firing, HEP, POC    Person(s) Educated Patient    Methods Explanation;Demonstration;Verbal cues;Tactile cues;Handout    Comprehension Verbalized understanding;Returned demonstration              PT Short Term Goals - 10/20/20 1041       PT SHORT TERM GOAL #1   Title Pt will become independent with HEP in order to demonstrate synthesis of PT education.    Time 2    Period Weeks    Status New      PT SHORT TERM GOAL #2   Title Pt will be able to demonstrate full BHB reaching without pain in order to demonstrate functional improvement in UE function for self-care and house hold duties.    Time 3    Period Weeks    Status New      PT SHORT TERM GOAL #3   Title Pt will be able to demonstrate/report centralization of symptoms away from the digits in order to demonstrate functional improvement in UE sensation and function.    Time 3    Period Weeks    Status New               PT Long Term Goals - 10/20/20 1043       PT LONG TERM GOAL #1   Title Pt  will become independent with final HEP in order to demonstrate synthesis of PT education.    Time 6    Period Weeks    Status New      PT LONG TERM GOAL #2   Title Pt will be able to reach [facility] and grab/reach/hold >5 lbs in order to demonstrate functional improvement in R UE function.    Time 6    Period Weeks    Status New      PT LONG TERM GOAL #3   Title Pt will have an at least 16 pt improvement in Quick DASH measure in order to demonstrate MCID improvement in  daily function.    Time  6    Period Weeks    Status New      PT LONG TERM GOAL #4   Title Pt will be able to reach [facility] and grab/reach/hold >5 lbs without pain  in order to demonstrate functional improvement in R UE function for ADL simulated activity.    Time 6    Period Weeks    Status New                   Plan - 11/09/20 1705     Clinical Impression Statement Pt with minimal response to manual therapy during today's session. May not need to pursue manual therapy intervention if no noted changes at next session. Pt required VC and TC at today's session for decrease intensity and ROM with stretching and exercise in order to reduce exacerbation of pain and soreness following exercise. Pt demonstrated ability to perform progressed scapular and postural strengthening exercise without increased pain or difficulty. Plan to assess for response to median n. glide at next session. Irritation of only 1/10 increase while performed in clinic. Pt would benefit from continued skilled therapy in order to reach goals and maximize functional R UE strength and function for full return to PLOF.    Personal Factors and Comorbidities Comorbidity 1;Time since onset of injury/illness/exacerbation;Age;Other    Examination-Activity Limitations Reach Overhead;Carry;Lift;Dressing;Other    Examination-Participation Restrictions Occupation;Cleaning;Community Activity;Driving;Laundry;Yard Work;Shop;Other    Stability/Clinical Decision Making Evolving/Moderate complexity    Rehab Potential Good    PT Frequency 2x / week    PT Duration 6 weeks    PT Treatment/Interventions ADLs/Self Care Home Management;Aquatic Therapy;Electrical Stimulation;Iontophoresis 4mg /ml Dexamethasone;Moist Heat;Traction;Ultrasound;Gait training;Stair training;Functional mobility training;Therapeutic activities;Therapeutic exercise;Balance training;Neuromuscular re-education;Patient/family education;Manual techniques;Dry needling;Passive  range of motion;Taping;Joint Manipulations;Spinal Manipulations    PT Next Visit Plan Review HEP, manual traction, joint mobs,  subocc and UT STM, posterior cuff STM, rowing    PT Home Exercise Plan Access Code: [email or web address]    Consulted and Agree with Plan of Care Patient             Patient will benefit from skilled therapeutic intervention in order to improve the following deficits and impairments:  Decreased range of motion, Impaired UE functional use, Increased muscle spasms, Pain, Impaired sensation, Decreased strength, Decreased mobility, Hypomobility, Impaired flexibility, Improper body mechanics, Postural dysfunction, Decreased activity tolerance, Decreased endurance (decreased due to pain)  Visit Diagnosis: Cervicalgia  Muscle weakness (generalized)     Problem List Patient Active Problem List   Diagnosis Date Noted   Cyst of vagina 02/09/2020   Basal cell carcinoma of nose 02/09/2020   Personal history of other malignant neoplasm of skin 01/21/2020   Brain vascular malformation 05/12/2017   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   Prediabetes 06/03/2016   At high risk for osteoporosis 05/31/2016   Estrogen deficiency 05/31/2016   Encounter for long-term (current) use of medications 09/13/2015   B12 deficiency 09/13/2015   Osteoarthritis of sternoclavicular joint 05/17/2013   Slow transit constipation 07/19/2012   OSA on CPAP 10/02/2011   Encounter for general adult medical examination with abnormal findings 06/20/2011   GERD (gastroesophageal reflux disease) 07/25/2010   Colon polyps 70/35/0093   Umbilical hernia 81/82/9937   Melanoma ([place])    Hyperlipidemia    Premenstrual migraine     Daleen Bo, PT 11/09/2020, 6:08 PM  [facility] [address] [place], [place], [address] Phone: [phone]   Fax:  [phone]  Name: Jennifer Sharp MRN:  [id number] Date of Birth: Feb 27, 1960

## 2020-11-14 ENCOUNTER — Encounter (HOSPITAL_BASED_OUTPATIENT_CLINIC_OR_DEPARTMENT_OTHER): Payer: 59 | Admitting: Physical Therapy

## 2020-11-15 ENCOUNTER — Other Ambulatory Visit (HOSPITAL_BASED_OUTPATIENT_CLINIC_OR_DEPARTMENT_OTHER): Payer: Self-pay

## 2020-11-15 ENCOUNTER — Ambulatory Visit: Payer: 59 | Attending: Internal Medicine

## 2020-11-15 ENCOUNTER — Encounter (HOSPITAL_BASED_OUTPATIENT_CLINIC_OR_DEPARTMENT_OTHER): Payer: Self-pay | Admitting: Physical Therapy

## 2020-11-15 ENCOUNTER — Other Ambulatory Visit: Payer: Self-pay

## 2020-11-15 ENCOUNTER — Ambulatory Visit (HOSPITAL_BASED_OUTPATIENT_CLINIC_OR_DEPARTMENT_OTHER): Payer: 59 | Admitting: Physical Therapy

## 2020-11-15 DIAGNOSIS — Z23 Encounter for immunization: Secondary | ICD-10-CM

## 2020-11-15 DIAGNOSIS — M6281 Muscle weakness (generalized): Secondary | ICD-10-CM | POA: Diagnosis not present

## 2020-11-15 DIAGNOSIS — M542 Cervicalgia: Secondary | ICD-10-CM

## 2020-11-15 MED ORDER — PFIZER COVID-19 VAC BIVALENT 30 MCG/0.3ML IM SUSP
INTRAMUSCULAR | 0 refills | Status: DC
  Filled 2020-11-15: qty 0.3, 1d supply, fill #0

## 2020-11-15 NOTE — Patient Instructions (Signed)
Access Code: [email or web address] URL: [email or web address] Date: 11/15/2020 Prepared by: Daleen Bo  Exercises Gentle Levator Scapulae Stretch - 2 x daily - 7 x weekly - 1 sets - 3 reps - 30 hold Median Nerve Flossing - Tray - 1 x daily - 7 x weekly - 2 sets - 10 reps Shoulder External Rotation and Scapular Retraction with Resistance - 1 x daily - 3-4 x weekly - 3 sets - 10 reps Standing Shoulder Row with Anchored Resistance - 1 x daily - 3-4 x weekly - 3 sets - 10 reps Quadruped Cervical Retraction - 1 x daily - 3-4 x weekly - 3 sets - 10 reps Wall Push Up with Plus - 1 x daily - 3-4 x weekly - 2 sets - 10 reps

## 2020-11-15 NOTE — Therapy (Signed)
[facility] [address] [place], [place], [address] Phone: [phone]   Fax:  [phone]  Physical Therapy Treatment  Patient Details  Name: Jennifer Sharp MRN: [id number] Date of Birth: 1960-03-01 Referring Provider (PT): Netta Cedars, MD   Encounter Date: 11/15/2020   PT End of Session - 11/15/20 0903     Visit Number 3    Number of Visits 13    Date for PT Re-Evaluation 01/18/21    Authorization Type [employer]    PT Start Time 0845    PT Stop Time 0925    PT Time Calculation (min) 40 min    Activity Tolerance Patient tolerated treatment well;Patient limited by pain    Behavior During Therapy [facility] for tasks assessed/performed             Past Medical History:  Diagnosis Date   Ankle swelling    occasional    Cavernoma 05/2017   Colon polyps 07/27/4852   Complication of anesthesia    urinary retention   DUB (dysfunctional uterine bleeding) 2016/2017   GYN did endometrial bx (BENIGN PATH) + pap 04/11/15; u/s to follow   Edema    Endometrial polyp    GERD (gastroesophageal reflux disease) 07/25/2010   with schatzki's ring on endo 07/2010   History of recurrent UTIs 07/25/2010   Melanoma ([place]) 2006   on right arm   Miscarriage    OSA on CPAP 10/02/2011   Premenstrual migraine    headaches since bleeding started   Schatzki's ring    Seasonal allergic rhinitis    SI (sacroiliac) joint inflammation (HCC)    Tachycardia    H/O transient  tach. as a chlid    Umbilical hernia 07/20/7033   Unspecified constipation 07/19/2012    Past Surgical History:  Procedure Laterality Date   COLONOSCOPY  02/2010   5 yr recall (FH colon cancer)   DILATATION & CURRETTAGE/HYSTEROSCOPY WITH RESECTOCOPE N/A 04/21/2015   Path: benign. Procedure: [place];  Surgeon: Eldred Manges, MD;  Location: [facility];  Service: Gynecology;  Laterality: N/A;   DILATION AND CURETTAGE OF UTERUS   [phone]   DILATION AND EVACUATION N/A 04/21/2015   Done for DUB/Menorrhagia.  Procedure: DILATATION AND EVACUATION;  Surgeon: Eldred Manges, MD;  Location: [facility];  Service: Gynecology;  Laterality: N/A;   EXPLORATORY LAPAROTOMY     HERNIA REPAIR     left inguinal   MELANOMA EXCISION  2006   R arm   oral surgeries  70's and 80's   TONSILLECTOMY     childhood   VENTRAL HERNIA REPAIR N/A 10/29/2012   Procedure: LAPAROSCOPIC VENTRAL HERNIA WITH MESH;  Surgeon: Shann Medal, MD;  Location: [facility];  Service: General;  Laterality: N/A;    There were no vitals filed for this visit.   Subjective Assessment - 11/15/20 0850     Subjective Pt states she was sore after the session and was sore for about a day or two. She states it feels like DOMS.    Limitations Reading;Sitting;House hold activities;Writing    Diagnostic tests C5-6: Mild broad-based disc bulge. Bilateral uncovertebral  degenerative changes. Severe bilateral foraminal stenosis. No  central canal stenosis.     C6-7: Mild broad-based disc bulge. Bilateral uncovertebral  degenerative changes. Moderate bilateral foraminal stenosis. No  central canal stenosis.    Patient Stated Goals Biggest goal is to prevent surgery    Currently in Pain? No/denies  Pain Score 0-No pain    Pain Onset More than a month ago                               [facility] Adult PT Treatment/Exercise - 11/15/20 0001       Posture/Postural Control   Posture/Postural Control Postural limitations    Postural Limitations Rounded Shoulders      Exercises   Exercises Neck      Neck Exercises: Machines for Strengthening   UBE (Upper Arm Bike) L1 3 min      Neck Exercises: Standing   Other Standing Exercises bilat shoulder ER RTB 2x10   with scap retraction   Other Standing Exercises blue TB rowing 2x10 with half chin tuck; horizontal RTB ABD 2x10; scap push up at wall 15x     Neck Exercises: Seated   Neck Retraction Limitations  10x 2s      Neck Exercises: Stabilization   Stabilization quadruped chin tucks 2s 10x      Neck Exercises: Stretches   Levator Stretch Limitations 30s 3x                                      PT Education - 11/15/20 0857     Education Details exercise progression, DOMS expectations, muscle firing, HEP    Person(s) Educated Patient    Methods Explanation;Demonstration;Tactile cues;Verbal cues    Comprehension Verbalized understanding;Returned demonstration              PT Short Term Goals - 11/15/20 1119       PT SHORT TERM GOAL #1   Title Pt will become independent with HEP in order to demonstrate synthesis of PT education.    Time 2    Period Weeks    Status Achieved      PT SHORT TERM GOAL #2   Title Pt will be able to demonstrate full BHB reaching without pain in order to demonstrate functional improvement in UE function for self-care and house hold duties.    Time 3    Period Weeks    Status On-going      PT SHORT TERM GOAL #3   Title Pt will be able to demonstrate/report centralization of symptoms away from the digits in order to demonstrate functional improvement in UE sensation and function.    Time 3    Period Weeks    Status On-going               PT Long Term Goals - 10/20/20 1043       PT LONG TERM GOAL #1   Title Pt  will become independent with final HEP in order to demonstrate synthesis of PT education.    Time 6    Period Weeks    Status New      PT LONG TERM GOAL #2   Title Pt will be able to reach [facility] and grab/reach/hold >5 lbs in order to demonstrate functional improvement in R UE function.    Time 6    Period Weeks    Status New      PT LONG TERM GOAL #3   Title Pt will have an at least 16 pt improvement in Quick DASH measure in order to demonstrate MCID improvement in daily function.    Time 6    Period Weeks  Status New      PT LONG TERM GOAL #4   Title Pt will be able to reach [facility] and grab/reach/hold >5 lbs  without pain  in order to demonstrate functional improvement in R UE function for ADL simulated activity.    Time 6    Period Weeks    Status New                   Plan - 11/15/20 5456     Clinical Impression Statement Pt able to perform exercise during session without the use of manual after report of no change after last session. Pt able to continue with scapular strength and median n. glide from last session without increased irritation. No increase of NT or pain more than 2/10 during review and progression of postural/scapular stability. Pt found increased R periscapular fatigue with resisted exercise and required VC for neck and scapular position. Pt progressing well with strengthening. Plan to add in tri-planar movement at next session. Pt to be out of town for vacation with gap in [place] due to travel. Pt would benefit from continued skilled therapy in order to reach goals and maximize functional R UE strength and function for full return to PLOF.    Personal Factors and Comorbidities Comorbidity 1;Time since onset of injury/illness/exacerbation;Age;Other    Examination-Activity Limitations Reach Overhead;Carry;Lift;Dressing;Other    Examination-Participation Restrictions Occupation;Cleaning;Community Activity;Driving;Laundry;Yard Work;Shop;Other    Stability/Clinical Decision Making Evolving/Moderate complexity    Rehab Potential Good    PT Frequency 2x / week    PT Duration 6 weeks    PT Treatment/Interventions ADLs/Self Care Home Management;Aquatic Therapy;Electrical Stimulation;Iontophoresis 4mg /ml Dexamethasone;Moist Heat;Traction;Ultrasound;Gait training;Stair training;Functional mobility training;Therapeutic activities;Therapeutic exercise;Balance training;Neuromuscular re-education;Patient/family education;Manual techniques;Dry needling;Passive range of motion;Taping;Joint Manipulations;Spinal Manipulations    PT Next Visit Plan Review HEP, manual traction, joint mobs,  subocc  and UT STM, posterior cuff STM, rowing    PT Home Exercise Plan Access Code: [email or web address]    Consulted and Agree with Plan of Care Patient             Patient will benefit from skilled therapeutic intervention in order to improve the following deficits and impairments:  Decreased range of motion, Impaired UE functional use, Increased muscle spasms, Pain, Impaired sensation, Decreased strength, Decreased mobility, Hypomobility, Impaired flexibility, Improper body mechanics, Postural dysfunction, Decreased activity tolerance, Decreased endurance (decreased due to pain)  Visit Diagnosis: Cervicalgia  Muscle weakness (generalized)     Problem List Patient Active Problem List   Diagnosis Date Noted   Cyst of vagina 02/09/2020   Basal cell carcinoma of nose 02/09/2020   Personal history of other malignant neoplasm of skin 01/21/2020   Brain vascular malformation 05/12/2017   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   Prediabetes 06/03/2016   At high risk for osteoporosis 05/31/2016   Estrogen deficiency 05/31/2016   Encounter for long-term (current) use of medications 09/13/2015   B12 deficiency 09/13/2015   Osteoarthritis of sternoclavicular joint 05/17/2013   Slow transit constipation 07/19/2012   OSA on CPAP 10/02/2011   Encounter for general adult medical examination with abnormal findings 06/20/2011   GERD (gastroesophageal reflux disease) 07/25/2010   Colon polyps 34/28/7681   Umbilical hernia 15/72/6203   Melanoma ([place])    Hyperlipidemia    Premenstrual migraine     Daleen Bo, PT 11/15/2020, 11:20 AM  [facility] [address] [place], [place], [address] Phone: [phone]   Fax:  [phone]  Name: Jennifer Sharp  MRN: [id number] Date of Birth: 07/31/1960

## 2020-11-15 NOTE — Progress Notes (Signed)
   Covid-19 Vaccination Clinic  Name:  BLAYRE PAPANIA    MRN: [id number] DOB: 1960-12-26  11/15/2020  Ms. Sargent was observed post Covid-19 immunization for 15 minutes without incident. She was provided with Vaccine Information Sheet and instruction to access the V-Safe system.   Ms. Graddy was instructed to call 911 with any severe reactions post vaccine: Difficulty breathing  Swelling of face and throat  A fast heartbeat  A bad rash all over body  Dizziness and weakness

## 2020-11-17 ENCOUNTER — Encounter (HOSPITAL_BASED_OUTPATIENT_CLINIC_OR_DEPARTMENT_OTHER): Payer: 59 | Admitting: Physical Therapy

## 2020-12-08 ENCOUNTER — Other Ambulatory Visit: Payer: Self-pay

## 2020-12-11 ENCOUNTER — Encounter (HOSPITAL_BASED_OUTPATIENT_CLINIC_OR_DEPARTMENT_OTHER): Payer: 59 | Attending: Family Medicine | Admitting: Physical Therapy

## 2020-12-11 ENCOUNTER — Other Ambulatory Visit: Payer: Self-pay

## 2020-12-11 ENCOUNTER — Encounter (HOSPITAL_BASED_OUTPATIENT_CLINIC_OR_DEPARTMENT_OTHER): Payer: Self-pay | Admitting: Physical Therapy

## 2020-12-11 DIAGNOSIS — M542 Cervicalgia: Secondary | ICD-10-CM | POA: Insufficient documentation

## 2020-12-11 DIAGNOSIS — M6281 Muscle weakness (generalized): Secondary | ICD-10-CM | POA: Diagnosis not present

## 2020-12-11 NOTE — Therapy (Signed)
[facility] [address] [place], [place], [address] Phone: [phone]   Fax:  [phone]  Physical Therapy Progress Note  Patient Details  Name: Jennifer Sharp MRN: [id number] Date of Birth: Feb 17, 1961 Referring Provider (PT): Netta Cedars, MD   Encounter Date: 12/11/2020   PT End of Session - 12/11/20 1343     Visit Number 4    Number of Visits 13    Date for PT Re-Evaluation 01/18/21    Authorization Type [employer]    PT Start Time 8003    PT Stop Time 1425    PT Time Calculation (min) 42 min    Activity Tolerance Patient tolerated treatment well;Patient limited by pain    Behavior During Therapy [facility] for tasks assessed/performed             Past Medical History:  Diagnosis Date   Ankle swelling    occasional    Cavernoma 05/2017   Colon polyps 05/27/1789   Complication of anesthesia    urinary retention   DUB (dysfunctional uterine bleeding) 2016/2017   GYN did endometrial bx (BENIGN PATH) + pap 04/11/15; u/s to follow   Edema    Endometrial polyp    GERD (gastroesophageal reflux disease) 07/25/2010   with schatzki's ring on endo 07/2010   History of recurrent UTIs 07/25/2010   Melanoma ([place]) 2006   on right arm   Miscarriage    OSA on CPAP 10/02/2011   Premenstrual migraine    headaches since bleeding started   Schatzki's ring    Seasonal allergic rhinitis    SI (sacroiliac) joint inflammation (HCC)    Tachycardia    H/O transient  tach. as a chlid    Umbilical hernia 5/0/5697   Unspecified constipation 07/19/2012    Past Surgical History:  Procedure Laterality Date   COLONOSCOPY  02/2010   5 yr recall (FH colon cancer)   DILATATION & CURRETTAGE/HYSTEROSCOPY WITH RESECTOCOPE N/A 04/21/2015   Path: benign. Procedure: [place];  Surgeon: Eldred Manges, MD;  Location: [facility];  Service: Gynecology;  Laterality: N/A;   DILATION AND CURETTAGE OF  UTERUS  [phone]   DILATION AND EVACUATION N/A 04/21/2015   Done for DUB/Menorrhagia.  Procedure: DILATATION AND EVACUATION;  Surgeon: Eldred Manges, MD;  Location: [facility];  Service: Gynecology;  Laterality: N/A;   EXPLORATORY LAPAROTOMY     HERNIA REPAIR     left inguinal   MELANOMA EXCISION  2006   R arm   oral surgeries  70's and 80's   TONSILLECTOMY     childhood   VENTRAL HERNIA REPAIR N/A 10/29/2012   Procedure: LAPAROSCOPIC VENTRAL HERNIA WITH MESH;  Surgeon: Shann Medal, MD;  Location: [facility];  Service: General;  Laterality: N/A;    There were no vitals filed for this visit.   Subjective Assessment - 12/11/20 1344     Subjective Pt states things were going well but had some neck pain after carrying a backpack on a hike. Pt states the pain lingered for a few hours and she took Tylenol with it. She states that using trekking poles with sometimes causes pain. Pt notices less NT frequnecy.    Limitations Reading;Sitting;House hold activities;Writing    Diagnostic tests C5-6: Mild broad-based disc bulge. Bilateral uncovertebral  degenerative changes. Severe bilateral foraminal stenosis. No  central canal stenosis.     C6-7: Mild broad-based disc bulge. Bilateral uncovertebral  degenerative changes. Moderate bilateral foraminal stenosis.  No  central canal stenosis.    Patient Stated Goals Biggest goal is to prevent surgery    Currently in Pain? No/denies    Pain Score 0-No pain    Pain Onset More than a month ago                [facility] PT Assessment - 12/11/20 0001       Assessment   Medical Diagnosis M54.2 (ICD-10-CM) - Cervicalgia    Referring Provider (PT) Netta Cedars, MD    Hand Dominance Right    Prior Therapy Neck and R shoulder      Precautions   Precautions None      Restrictions   Weight Bearing Restrictions No      Balance Screen   Has the patient fallen in the past 6 months No      [place] residence       Prior Function   Level of Independence Independent      Cognition   Overall Cognitive Status Within Functional Limits for tasks assessed      Observation/Other Assessments   Other Surveys  Quick Dash    Quick DASH  11.4 / 100 = 11.4 %      Sensation   Light Touch Impaired by gross assessment      Coordination   Gross Motor Movements are Fluid and Coordinated Yes      Posture/Postural Control   Posture/Postural Control Postural limitations    Postural Limitations Rounded Shoulders      AROM   Overall AROM Comments C/S WFL; R shoulder WNL      Strength   Overall Strength Comments R shoulder 4/5 throughout; fatiguing weakness in C6 and C8; Grip dyno: L 50lbs, 45, 45; R 45lb, 55, 45      Palpation   Palpation comment signficant TTP in R UT, levator, and cervical paraspinals, R wrist flexors      Special Tests    Special Tests --    Cervical Tests --      Spurling's   Findings --      Distraction Test   Findngs Positive                           [facility] Adult PT Treatment/Exercise - 12/11/20 0001       Exercises   Exercises Neck      Neck Exercises: Machines for Strengthening         Neck Exercises: Standing   Other Standing Exercises bilat shoulder ER RTB 2x10   with scap retraction   Other Standing Exercises blue TB rowing 2x10 with half chin tuck; horizontal RTB ABD 2x10; wall push up plus 15x; diagonals RTB 6x      Neck Exercises: Seated   Neck Retraction Limitations 10x 2s quadruped    Other Seated Exercise Self massage R wrist flexors ischemic pressure      Neck Exercises: Stabilization   Stabilization quadruped chin tucks 2s 10x      Neck Exercises: Stretches   Levator Stretch Limitations 30s 3x                     PT Education - 12/11/20 1347     Education Details exercise progression, DOMS expectations, muscle firing, HEP    Person(s) Educated Patient    Methods Explanation;Demonstration;Tactile cues;Verbal cues     Comprehension Verbalized understanding;Returned demonstration  PT Short Term Goals - 12/11/20 1802       PT SHORT TERM GOAL #1   Title Pt will become independent with HEP in order to demonstrate synthesis of PT education.    Time 2    Period Weeks    Status Achieved      PT SHORT TERM GOAL #2   Title Pt will be able to demonstrate full BHB reaching without pain in order to demonstrate functional improvement in UE function for self-care and house hold duties.    Time 3    Period Weeks    Status On-going      PT SHORT TERM GOAL #3   Title Pt will be able to demonstrate/report centralization of symptoms away from the digits in order to demonstrate functional improvement in UE sensation and function.    Time 3    Period Weeks    Status Partially Met               PT Long Term Goals - 12/11/20 1802       PT LONG TERM GOAL #1   Title Pt  will become independent with final HEP in order to demonstrate synthesis of PT education.    Time 6    Period Weeks    Status On-going      PT LONG TERM GOAL #2   Title Pt will be able to reach [facility] and grab/reach/hold >5 lbs in order to demonstrate functional improvement in R UE function.    Time 6    Period Weeks    Status On-going      PT LONG TERM GOAL #3   Title Pt will have an at least 16 pt improvement in Quick DASH measure in order to demonstrate MCID improvement in daily function.    Time 6    Period Weeks    Status Achieved      PT LONG TERM GOAL #4   Title Pt will be able to reach [facility] and grab/reach/hold >5 lbs without pain  in order to demonstrate functional improvement in R UE function for ADL simulated activity.    Time 6    Period Weeks    Status On-going                   Plan - 12/11/20 1757     Clinical Impression Statement Pt demonstrates signficant improvement in performance based outcomes and self reported measures. Pt demonstrates continued improvement with R UE strength and pain  free C/S ROM as compared to last session. Pt able to continue with progressing of R UE exercise and cervical stability exercise but requires increased VC and TC for created stable scapular and cervical position. Pt independent with HEP at this time and able to perform exercise while traveling. Plan to re-check again after pt returns. Pt would benefit from continued skilled therapy in order to reach goals and maximize functional R UE strength and function for full return to PLOF.    Personal Factors and Comorbidities Comorbidity 1;Time since onset of injury/illness/exacerbation;Age;Other    Examination-Activity Limitations Reach Overhead;Carry;Lift;Dressing;Other    Examination-Participation Restrictions Occupation;Cleaning;Community Activity;Driving;Laundry;Yard Work;Shop;Other    Stability/Clinical Decision Making Evolving/Moderate complexity    Rehab Potential Good    PT Frequency 2x / week    PT Duration 6 weeks    PT Treatment/Interventions ADLs/Self Care Home Management;Aquatic Therapy;Electrical Stimulation;Iontophoresis 7m/ml Dexamethasone;Moist Heat;Traction;Ultrasound;Gait training;Stair training;Functional mobility training;Therapeutic activities;Therapeutic exercise;Balance training;Neuromuscular re-education;Patient/family education;Manual techniques;Dry needling;Passive range of motion;Taping;Joint Manipulations;Spinal Manipulations  PT Next Visit Plan Review HEP, manual traction, joint mobs,  subocc and UT STM, posterior cuff STM, rowing    PT Home Exercise Plan Access Code: [email or web address]    Consulted and Agree with Plan of Care Patient             Patient will benefit from skilled therapeutic intervention in order to improve the following deficits and impairments:  Decreased range of motion, Impaired UE functional use, Increased muscle spasms, Pain, Impaired sensation, Decreased strength, Decreased mobility, Hypomobility, Impaired flexibility, Improper body mechanics, Postural  dysfunction, Decreased activity tolerance, Decreased endurance (decreased due to pain)  Visit Diagnosis: Cervicalgia  Muscle weakness (generalized)     Problem List Patient Active Problem List   Diagnosis Date Noted   Cyst of vagina 02/09/2020   Basal cell carcinoma of nose 02/09/2020   Personal history of other malignant neoplasm of skin 01/21/2020   Brain vascular malformation 05/12/2017   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   Prediabetes 06/03/2016   At high risk for osteoporosis 05/31/2016   Estrogen deficiency 05/31/2016   Encounter for long-term (current) use of medications 09/13/2015   B12 deficiency 09/13/2015   Osteoarthritis of sternoclavicular joint 05/17/2013   Slow transit constipation 07/19/2012   OSA on CPAP 10/02/2011   Encounter for general adult medical examination with abnormal findings 06/20/2011   GERD (gastroesophageal reflux disease) 07/25/2010   Colon polyps 72/25/7505   Umbilical hernia 18/33/5825   Melanoma ([place])    Hyperlipidemia    Premenstrual migraine     Daleen Bo, PT 12/11/2020, 6:02 PM  [facility] [address] [place], [place], [address] Phone: [phone]   Fax:  [phone]  Name: Jennifer Sharp MRN: [id number] Date of Birth: 07-23-1960

## 2020-12-11 NOTE — Patient Instructions (Signed)
Access Code: [email or web address] URL: [email or web address] Date: 12/11/2020 Prepared by: Daleen Bo  Exercises Gentle Levator Scapulae Stretch - 2 x daily - 7 x weekly - 1 sets - 3 reps - 30 hold Median Nerve Flossing - Tray - 1 x daily - 7 x weekly - 2 sets - 10 reps Shoulder External Rotation and Scapular Retraction with Resistance - 1 x daily - 3-4 x weekly - 3 sets - 10 reps Standing Shoulder Row with Anchored Resistance - 1 x daily - 3-4 x weekly - 3 sets - 10 reps Quadruped Cervical Retraction - 1 x daily - 3-4 x weekly - 3 sets - 10 reps Wall Push Up with Plus - 1 x daily - 3-4 x weekly - 2 sets - 10 reps Standing Shoulder Diagonal Horizontal Abduction 60/120 Degrees with Resistance - 1 x daily - 3-4 x weekly - 1 sets - 6 reps

## 2020-12-13 ENCOUNTER — Ambulatory Visit: Payer: 59 | Admitting: Nurse Practitioner

## 2020-12-14 DIAGNOSIS — G4733 Obstructive sleep apnea (adult) (pediatric): Secondary | ICD-10-CM | POA: Diagnosis not present

## 2020-12-29 DIAGNOSIS — D489 Neoplasm of uncertain behavior, unspecified: Secondary | ICD-10-CM | POA: Diagnosis not present

## 2020-12-29 DIAGNOSIS — L821 Other seborrheic keratosis: Secondary | ICD-10-CM | POA: Diagnosis not present

## 2020-12-29 DIAGNOSIS — D1801 Hemangioma of skin and subcutaneous tissue: Secondary | ICD-10-CM | POA: Diagnosis not present

## 2020-12-29 DIAGNOSIS — Z85820 Personal history of malignant melanoma of skin: Secondary | ICD-10-CM | POA: Diagnosis not present

## 2021-01-02 ENCOUNTER — Encounter (HOSPITAL_BASED_OUTPATIENT_CLINIC_OR_DEPARTMENT_OTHER): Payer: Self-pay | Admitting: Physical Therapy

## 2021-01-02 ENCOUNTER — Other Ambulatory Visit: Payer: Self-pay

## 2021-01-02 ENCOUNTER — Ambulatory Visit (HOSPITAL_BASED_OUTPATIENT_CLINIC_OR_DEPARTMENT_OTHER): Payer: 59 | Attending: Orthopedic Surgery | Admitting: Physical Therapy

## 2021-01-02 DIAGNOSIS — Z124 Encounter for screening for malignant neoplasm of cervix: Secondary | ICD-10-CM | POA: Diagnosis not present

## 2021-01-02 DIAGNOSIS — M6281 Muscle weakness (generalized): Secondary | ICD-10-CM | POA: Diagnosis not present

## 2021-01-02 DIAGNOSIS — L723 Sebaceous cyst: Secondary | ICD-10-CM | POA: Diagnosis not present

## 2021-01-02 DIAGNOSIS — Z1231 Encounter for screening mammogram for malignant neoplasm of breast: Secondary | ICD-10-CM | POA: Diagnosis not present

## 2021-01-02 DIAGNOSIS — Z01411 Encounter for gynecological examination (general) (routine) with abnormal findings: Secondary | ICD-10-CM | POA: Diagnosis not present

## 2021-01-02 DIAGNOSIS — M542 Cervicalgia: Secondary | ICD-10-CM | POA: Diagnosis not present

## 2021-01-02 DIAGNOSIS — C44311 Basal cell carcinoma of skin of nose: Secondary | ICD-10-CM | POA: Diagnosis not present

## 2021-01-02 DIAGNOSIS — Z6823 Body mass index (BMI) 23.0-23.9, adult: Secondary | ICD-10-CM | POA: Diagnosis not present

## 2021-01-02 DIAGNOSIS — Z1239 Encounter for other screening for malignant neoplasm of breast: Secondary | ICD-10-CM | POA: Diagnosis not present

## 2021-01-02 NOTE — Therapy (Signed)
[facility] [address] [place], [place], [address] Phone: [phone]   Fax:  [phone]  Physical Therapy Treatment  Patient Details  Name: Jennifer Sharp MRN: [id number] Date of Birth: 1960-11-21 Referring Provider (PT): Netta Cedars, MD   Encounter Date: 01/02/2021   PT End of Session - 01/02/21 1556     Visit Number 5    Number of Visits 13    Date for PT Re-Evaluation 01/18/21    Authorization Type [employer]    PT Start Time 6433    PT Stop Time 2951    PT Time Calculation (min) 40 min    Activity Tolerance Patient tolerated treatment well;Patient limited by pain    Behavior During Therapy [facility] for tasks assessed/performed             Past Medical History:  Diagnosis Date   Ankle swelling    occasional    Cavernoma 05/2017   Colon polyps 09/25/4164   Complication of anesthesia    urinary retention   DUB (dysfunctional uterine bleeding) 2016/2017   GYN did endometrial bx (BENIGN PATH) + pap 04/11/15; u/s to follow   Edema    Endometrial polyp    GERD (gastroesophageal reflux disease) 07/25/2010   with schatzki's ring on endo 07/2010   History of recurrent UTIs 07/25/2010   Melanoma ([place]) 2006   on right arm   Miscarriage    OSA on CPAP 10/02/2011   Premenstrual migraine    headaches since bleeding started   Schatzki's ring    Seasonal allergic rhinitis    SI (sacroiliac) joint inflammation (HCC)    Tachycardia    H/O transient  tach. as a chlid    Umbilical hernia 0/07/3014   Unspecified constipation 07/19/2012    Past Surgical History:  Procedure Laterality Date   COLONOSCOPY  02/2010   5 yr recall (FH colon cancer)   DILATATION & CURRETTAGE/HYSTEROSCOPY WITH RESECTOCOPE N/A 04/21/2015   Path: benign. Procedure: [place];  Surgeon: Eldred Manges, MD;  Location: [facility];  Service: Gynecology;  Laterality: N/A;   DILATION AND CURETTAGE OF UTERUS   [phone]   DILATION AND EVACUATION N/A 04/21/2015   Done for DUB/Menorrhagia.  Procedure: DILATATION AND EVACUATION;  Surgeon: Eldred Manges, MD;  Location: [facility];  Service: Gynecology;  Laterality: N/A;   EXPLORATORY LAPAROTOMY     HERNIA REPAIR     left inguinal   MELANOMA EXCISION  2006   R arm   oral surgeries  70's and 80's   TONSILLECTOMY     childhood   VENTRAL HERNIA REPAIR N/A 10/29/2012   Procedure: LAPAROSCOPIC VENTRAL HERNIA WITH MESH;  Surgeon: Shann Medal, MD;  Location: [facility];  Service: General;  Laterality: N/A;    There were no vitals filed for this visit.   Subjective Assessment - 01/02/21 1520     Subjective Pt states things were going well until she has to work yesterday and carrying her backpack, lunch, laptop, and computer work. She had more R sided neck pain. It has been the wrost since she has come to PT. She states that the NT continues to get better. Less frequent and less intensity of NT. The episodes of pain have lessened compared to before.    Limitations Reading;Sitting;House hold activities;Writing    Diagnostic tests C5-6: Mild broad-based disc bulge. Bilateral uncovertebral  degenerative changes. Severe bilateral foraminal stenosis. No  central canal stenosis.  C6-7: Mild broad-based disc bulge. Bilateral uncovertebral  degenerative changes. Moderate bilateral foraminal stenosis. No  central canal stenosis.    Patient Stated Goals Biggest goal is to prevent surgery    Currently in Pain? Yes    Pain Score 2     Pain Location Neck    Pain Orientation Right    Pain Descriptors / Indicators Dull    Pain Onset More than a month ago                               [facility] Adult PT Treatment/Exercise - 01/02/21 0001       Posture/Postural Control   Posture/Postural Control Postural limitations    Postural Limitations Rounded Shoulders      Exercises   Exercises Neck        Neck Exercises: Standing   Other Standing  Exercises GTB shrug 3x10 (stading on band)   with scap retraction   Other Standing Exercises horizontal RTB ABD 2x10; diagonals RTB 6x ; counter push ups 2x10     Neck Exercises: Seated   Neck Retraction Limitations 10x 2s quadruped    Other Seated Exercise Self massage R wrist flexors ischemic pressure      Neck Exercises: Stabilization   Stabilization quadruped chin tucks 2s 10x  (reviewed in HEP)                      PT Education - 01/02/21 1611     Education Details exercise progression, muscle firing, HEP, strength deficits, envelope of function, TPDN edu    Person(s) Educated Patient    Methods Tactile cues;Verbal cues;Handout;Explanation;Demonstration    Comprehension Verbalized understanding;Returned demonstration              PT Short Term Goals - 12/11/20 1802       PT SHORT TERM GOAL #1   Title Pt will become independent with HEP in order to demonstrate synthesis of PT education.    Time 2    Period Weeks    Status Achieved      PT SHORT TERM GOAL #2   Title Pt will be able to demonstrate full BHB reaching without pain in order to demonstrate functional improvement in UE function for self-care and house hold duties.    Time 3    Period Weeks    Status On-going      PT SHORT TERM GOAL #3   Title Pt will be able to demonstrate/report centralization of symptoms away from the digits in order to demonstrate functional improvement in UE sensation and function.    Time 3    Period Weeks    Status Partially Met               PT Long Term Goals - 12/11/20 1802       PT LONG TERM GOAL #1   Title Pt  will become independent with final HEP in order to demonstrate synthesis of PT education.    Time 6    Period Weeks    Status On-going      PT LONG TERM GOAL #2   Title Pt will be able to reach [facility] and grab/reach/hold >5 lbs in order to demonstrate functional improvement in R UE function.    Time 6    Period Weeks    Status On-going      PT  LONG TERM GOAL #3   Title Pt will  have an at least 16 pt improvement in Quick DASH measure in order to demonstrate MCID improvement in daily function.    Time 6    Period Weeks    Status Achieved      PT LONG TERM GOAL #4   Title Pt will be able to reach [facility] and grab/reach/hold >5 lbs without pain  in order to demonstrate functional improvement in R UE function for ADL simulated activity.    Time 6    Period Weeks    Status On-going                   Plan - 01/02/21 1612     Clinical Impression Statement Pt continues to report improvement with pain as well as reduce frequency of NT into R UE. Pt's recent report of pain increase with carrying and dest work appear related to UT and shoulder capacity/strength deficits. Pt able to introduce direct UT strengthening and progression of HEP without exacerbation of pain. Pt to be out of town again for the holidays. Plan to continue with POC and re-asses upon return. Pt would benefit from continued skilled therapy in order to reach goals and maximize functional R UE strength and function for full return to PLOF.    Personal Factors and Comorbidities Comorbidity 1;Time since onset of injury/illness/exacerbation;Age;Other    Examination-Activity Limitations Reach Overhead;Carry;Lift;Dressing;Other    Examination-Participation Restrictions Occupation;Cleaning;Community Activity;Driving;Laundry;Yard Work;Shop;Other    Stability/Clinical Decision Making Evolving/Moderate complexity    Rehab Potential Good    PT Frequency 2x / week    PT Duration 6 weeks    PT Treatment/Interventions ADLs/Self Care Home Management;Aquatic Therapy;Electrical Stimulation;Iontophoresis 63m/ml Dexamethasone;Moist Heat;Traction;Ultrasound;Gait training;Stair training;Functional mobility training;Therapeutic activities;Therapeutic exercise;Balance training;Neuromuscular re-education;Patient/family education;Manual techniques;Dry needling;Passive range of  motion;Taping;Joint Manipulations;Spinal Manipulations    PT Home Exercise Plan Access Code: [email or web address]    Consulted and Agree with Plan of Care Patient             Patient will benefit from skilled therapeutic intervention in order to improve the following deficits and impairments:  Decreased range of motion, Impaired UE functional use, Increased muscle spasms, Pain, Impaired sensation, Decreased strength, Decreased mobility, Hypomobility, Impaired flexibility, Improper body mechanics, Postural dysfunction, Decreased activity tolerance, Decreased endurance (decreased due to pain)  Visit Diagnosis: Cervicalgia  Muscle weakness (generalized)     Problem List Patient Active Problem List   Diagnosis Date Noted   Cyst of vagina 02/09/2020   Basal cell carcinoma of nose 02/09/2020   Personal history of other malignant neoplasm of skin 01/21/2020   Brain vascular malformation 05/12/2017   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   Prediabetes 06/03/2016   At high risk for osteoporosis 05/31/2016   Estrogen deficiency 05/31/2016   Encounter for long-term (current) use of medications 09/13/2015   B12 deficiency 09/13/2015   Osteoarthritis of sternoclavicular joint 05/17/2013   Slow transit constipation 07/19/2012   OSA on CPAP 10/02/2011   Encounter for general adult medical examination with abnormal findings 06/20/2011   GERD (gastroesophageal reflux disease) 07/25/2010   Colon polyps 053/64/6803  Umbilical hernia 021/22/4825  Melanoma ([place]    Hyperlipidemia    Premenstrual migraine     ADaleen Bo PT 01/02/2021, 4:17 PM  [facility] [address] [address]Phone: [phone]  Fax:  [phone] Name: Jennifer BARBERMRN: [id number] of Birth: 4Apr 01, 1962

## 2021-01-09 ENCOUNTER — Encounter: Payer: Self-pay | Admitting: Family Medicine

## 2021-01-09 DIAGNOSIS — C44219 Basal cell carcinoma of skin of left ear and external auricular canal: Secondary | ICD-10-CM | POA: Diagnosis not present

## 2021-01-30 ENCOUNTER — Other Ambulatory Visit: Payer: Self-pay

## 2021-01-30 ENCOUNTER — Ambulatory Visit (HOSPITAL_BASED_OUTPATIENT_CLINIC_OR_DEPARTMENT_OTHER): Payer: 59 | Attending: Orthopedic Surgery | Admitting: Physical Therapy

## 2021-01-30 ENCOUNTER — Encounter (HOSPITAL_BASED_OUTPATIENT_CLINIC_OR_DEPARTMENT_OTHER): Payer: Self-pay | Admitting: Physical Therapy

## 2021-01-30 DIAGNOSIS — M542 Cervicalgia: Secondary | ICD-10-CM | POA: Diagnosis not present

## 2021-01-30 DIAGNOSIS — M6281 Muscle weakness (generalized): Secondary | ICD-10-CM | POA: Diagnosis not present

## 2021-01-30 NOTE — Therapy (Signed)
[facility] [address] [place], [place], [address] Phone: [phone]   Fax:  [phone]  Physical Therapy Discharge  Patient Details  Name: Jennifer Sharp MRN: [id number] Date of Birth: Jul 16, 1960 Referring Provider (PT): Netta Cedars, MD   Encounter Date: 01/30/2021   PT End of Session - 01/30/21 1600     Visit Number 6    Number of Visits 13    Date for PT Re-Evaluation 01/18/21    Authorization Type [employer]    PT Start Time 1600    PT Stop Time 1630    PT Time Calculation (min) 30 min    Activity Tolerance Patient tolerated treatment well;Patient limited by pain    Behavior During Therapy [facility] for tasks assessed/performed             Past Medical History:  Diagnosis Date   Ankle swelling    occasional    Cavernoma 05/2017   Colon polyps 06/26/1636   Complication of anesthesia    urinary retention   DUB (dysfunctional uterine bleeding) 2016/2017   GYN did endometrial bx (BENIGN PATH) + pap 04/11/15; u/s to follow   Edema    Endometrial polyp    GERD (gastroesophageal reflux disease) 07/25/2010   with schatzki's ring on endo 07/2010   History of recurrent UTIs 07/25/2010   Melanoma ([place]) 2006   on right arm   Miscarriage    OSA on CPAP 10/02/2011   Premenstrual migraine    headaches since bleeding started   Schatzki's ring    Seasonal allergic rhinitis    SI (sacroiliac) joint inflammation (HCC)    Tachycardia    H/O transient  tach. as a chlid    Umbilical hernia 05/24/6597   Unspecified constipation 07/19/2012    Past Surgical History:  Procedure Laterality Date   COLONOSCOPY  02/2010   5 yr recall (FH colon cancer)   DILATATION & CURRETTAGE/HYSTEROSCOPY WITH RESECTOCOPE N/A 04/21/2015   Path: benign. Procedure: [place];  Surgeon: Eldred Manges, MD;  Location: [facility];  Service: Gynecology;  Laterality: N/A;   DILATION AND CURETTAGE OF UTERUS   [phone]   DILATION AND EVACUATION N/A 04/21/2015   Done for DUB/Menorrhagia.  Procedure: DILATATION AND EVACUATION;  Surgeon: Eldred Manges, MD;  Location: [facility];  Service: Gynecology;  Laterality: N/A;   EXPLORATORY LAPAROTOMY     HERNIA REPAIR     left inguinal   MELANOMA EXCISION  2006   R arm   oral surgeries  70's and 80's   TONSILLECTOMY     childhood   VENTRAL HERNIA REPAIR N/A 10/29/2012   Procedure: LAPAROSCOPIC VENTRAL HERNIA WITH MESH;  Surgeon: Shann Medal, MD;  Location: [facility];  Service: General;  Laterality: N/A;    There were no vitals filed for this visit.   Subjective Assessment - 01/30/21 1601     Subjective Pt states NT is better. She will only have NT 2x a week. She is 95% better. She states she has been better with carrying bags.    Limitations Reading;Sitting;House hold activities;Writing    Diagnostic tests C5-6: Mild broad-based disc bulge. Bilateral uncovertebral  degenerative changes. Severe bilateral foraminal stenosis. No  central canal stenosis.     C6-7: Mild broad-based disc bulge. Bilateral uncovertebral  degenerative changes. Moderate bilateral foraminal stenosis. No  central canal stenosis.    Patient Stated Goals Biggest goal is to prevent surgery    Currently in Pain?  No/denies    Pain Score 0-No pain    Pain Onset More than a month ago                [facility] PT Assessment - 01/30/21 0001       Assessment   Medical Diagnosis M54.2 (ICD-10-CM) - Cervicalgia    Referring Provider (PT) Netta Cedars, MD    Hand Dominance Right    Prior Therapy Neck and R shoulder      Precautions   Precautions None      Restrictions   Weight Bearing Restrictions No      Balance Screen   Has the patient fallen in the past 6 months No    Has the patient had a decrease in activity level because of a fear of falling?  No    Is the patient reluctant to leave their home because of a fear of falling?  No      Home Manufacturing systems engineer residence      Prior Function   Level of Independence Independent      Cognition   Overall Cognitive Status Within Functional Limits for tasks assessed      Observation/Other Assessments   Other Surveys  Quick Dash    Quick DASH  6.8 / 100 = 6.8 %      Sensation   Light Touch Impaired by gross assessment      Coordination   Gross Motor Movements are Fluid and Coordinated Yes      Posture/Postural Control   Posture/Postural Control Postural limitations    Postural Limitations Rounded Shoulders      AROM   Overall AROM Comments C/S WFL; R shoulder WNL      Strength   Overall Strength Comments R shoulder 4+/5 throughout; no longer fatiguing weakness in C6 and C8;      Distraction Test   Findngs Positive                                      PT Short Term Goals - 01/30/21 1638       PT SHORT TERM GOAL #1   Title Pt will become independent with HEP in order to demonstrate synthesis of PT education.    Time 2    Period Weeks    Status Achieved      PT SHORT TERM GOAL #2   Title Pt will be able to demonstrate full BHB reaching without pain in order to demonstrate functional improvement in UE function for self-care and house hold duties.    Time 3    Period Weeks    Status Achieved      PT SHORT TERM GOAL #3   Title Pt will be able to demonstrate/report centralization of symptoms away from the digits in order to demonstrate functional improvement in UE sensation and function.    Time 3    Period Weeks    Status Achieved               PT Long Term Goals - 01/30/21 1638       PT LONG TERM GOAL #1   Title Pt  will become independent with final HEP in order to demonstrate synthesis of PT education.    Time 6    Period Weeks    Status Achieved      PT LONG TERM GOAL #2   Title  Pt will be able to reach [facility] and grab/reach/hold >5 lbs in order to demonstrate functional improvement in R UE function.    Time 6     Period Weeks    Status Achieved      PT LONG TERM GOAL #3   Title Pt will have an at least 16 pt improvement in Quick DASH measure in order to demonstrate MCID improvement in daily function.    Time 6    Period Weeks    Status Achieved      PT LONG TERM GOAL #4   Title Pt will be able to reach [facility] and grab/reach/hold >5 lbs without pain  in order to demonstrate functional improvement in R UE function for ADL simulated activity.    Time 6    Period Weeks    Status Achieved                   Plan - 01/30/21 1638     Clinical Impression Statement Pt has progressed very well with rehab and reports signficant improvement in function as compared to starting therapy. Pt has met all goals at this time and demonstrates good understanding of self progression of exercise. Pt HEP updated at  this time. No further needs for skilled therapy for this episode. D/C at this time.    Personal Factors and Comorbidities Comorbidity 1;Time since onset of injury/illness/exacerbation;Age;Other    Examination-Activity Limitations Reach Overhead;Carry;Lift;Dressing;Other    Examination-Participation Restrictions Occupation;Cleaning;Community Activity;Driving;Laundry;Yard Work;Shop;Other    Stability/Clinical Decision Making Evolving/Moderate complexity    Rehab Potential Good    PT Frequency 2x / week    PT Duration 6 weeks    PT Treatment/Interventions ADLs/Self Care Home Management;Aquatic Therapy;Electrical Stimulation;Iontophoresis 49m/ml Dexamethasone;Moist Heat;Traction;Ultrasound;Gait training;Stair training;Functional mobility training;Therapeutic activities;Therapeutic exercise;Balance training;Neuromuscular re-education;Patient/family education;Manual techniques;Dry needling;Passive range of motion;Taping;Joint Manipulations;Spinal Manipulations    PT Home Exercise Plan Access Code: [email or web address]    Consulted and Agree with Plan of Care Patient             Visit  Diagnosis: Cervicalgia  Muscle weakness (generalized)     Problem List Patient Active Problem List   Diagnosis Date Noted   Cyst of vagina 02/09/2020   Basal cell carcinoma of nose 02/09/2020   Personal history of other malignant neoplasm of skin 01/21/2020   Brain vascular malformation 05/12/2017   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   Prediabetes 06/03/2016   At high risk for osteoporosis 05/31/2016   Estrogen deficiency 05/31/2016   Encounter for long-term (current) use of medications 09/13/2015   B12 deficiency 09/13/2015   Osteoarthritis of sternoclavicular joint 05/17/2013   Slow transit constipation 07/19/2012   OSA on CPAP 10/02/2011   Encounter for general adult medical examination with abnormal findings 06/20/2011   GERD (gastroesophageal reflux disease) 07/25/2010   Colon polyps 001/77/9390  Umbilical hernia 030/10/2328  [place]    Hyperlipidemia    Premenstrual migraine     ADaleen Bo PT 01/30/2021, 4:41 PM  [facility] [address] [address]Phone: [phone]  Fax:  [phone] Name: GARNETHA SILVERTHORNEMRN: [id number] of Birth: 431-Jan-1962   PHYSICAL THERAPY DISCHARGE SUMMARY  Visits from Start of Care: 6   Plan: Patient agrees to discharge.  Patient goals met. Patient is being discharged due to meeting the stated rehab goals.

## 2021-02-06 ENCOUNTER — Other Ambulatory Visit (HOSPITAL_COMMUNITY): Payer: Self-pay

## 2021-02-06 MED ORDER — CARESTART COVID-19 HOME TEST VI KIT
PACK | 0 refills | Status: DC
  Filled 2021-02-06: qty 4, 4d supply, fill #0

## 2021-04-12 ENCOUNTER — Telehealth: Payer: 59 | Admitting: Physician Assistant

## 2021-04-12 DIAGNOSIS — N390 Urinary tract infection, site not specified: Secondary | ICD-10-CM | POA: Diagnosis not present

## 2021-04-12 DIAGNOSIS — R3989 Other symptoms and signs involving the genitourinary system: Secondary | ICD-10-CM

## 2021-04-12 NOTE — Progress Notes (Signed)
Patient is out of town 

## 2021-07-11 DIAGNOSIS — D2270 Melanocytic nevi of unspecified lower limb, including hip: Secondary | ICD-10-CM | POA: Diagnosis not present

## 2021-07-11 DIAGNOSIS — L814 Other melanin hyperpigmentation: Secondary | ICD-10-CM | POA: Diagnosis not present

## 2021-07-11 DIAGNOSIS — D2260 Melanocytic nevi of unspecified upper limb, including shoulder: Secondary | ICD-10-CM | POA: Diagnosis not present

## 2021-07-11 DIAGNOSIS — L309 Dermatitis, unspecified: Secondary | ICD-10-CM | POA: Diagnosis not present

## 2021-07-11 DIAGNOSIS — L821 Other seborrheic keratosis: Secondary | ICD-10-CM | POA: Diagnosis not present

## 2021-07-11 DIAGNOSIS — D225 Melanocytic nevi of trunk: Secondary | ICD-10-CM | POA: Diagnosis not present

## 2021-07-11 DIAGNOSIS — Z85820 Personal history of malignant melanoma of skin: Secondary | ICD-10-CM | POA: Diagnosis not present

## 2021-07-11 DIAGNOSIS — Q828 Other specified congenital malformations of skin: Secondary | ICD-10-CM | POA: Diagnosis not present

## 2021-07-11 DIAGNOSIS — D1801 Hemangioma of skin and subcutaneous tissue: Secondary | ICD-10-CM | POA: Diagnosis not present

## 2021-10-26 DIAGNOSIS — D2260 Melanocytic nevi of unspecified upper limb, including shoulder: Secondary | ICD-10-CM | POA: Diagnosis not present

## 2021-10-26 DIAGNOSIS — L821 Other seborrheic keratosis: Secondary | ICD-10-CM | POA: Diagnosis not present

## 2021-10-26 DIAGNOSIS — D225 Melanocytic nevi of trunk: Secondary | ICD-10-CM | POA: Diagnosis not present

## 2021-10-26 DIAGNOSIS — L814 Other melanin hyperpigmentation: Secondary | ICD-10-CM | POA: Diagnosis not present

## 2021-10-26 DIAGNOSIS — Q828 Other specified congenital malformations of skin: Secondary | ICD-10-CM | POA: Diagnosis not present

## 2021-10-26 DIAGNOSIS — D2270 Melanocytic nevi of unspecified lower limb, including hip: Secondary | ICD-10-CM | POA: Diagnosis not present

## 2021-10-26 DIAGNOSIS — D1801 Hemangioma of skin and subcutaneous tissue: Secondary | ICD-10-CM | POA: Diagnosis not present

## 2021-10-26 DIAGNOSIS — H01119 Allergic dermatitis of unspecified eye, unspecified eyelid: Secondary | ICD-10-CM | POA: Diagnosis not present

## 2021-10-26 DIAGNOSIS — Z85820 Personal history of malignant melanoma of skin: Secondary | ICD-10-CM | POA: Diagnosis not present

## 2021-10-26 DIAGNOSIS — Z85828 Personal history of other malignant neoplasm of skin: Secondary | ICD-10-CM | POA: Diagnosis not present

## 2021-11-22 DIAGNOSIS — Z6825 Body mass index (BMI) 25.0-25.9, adult: Secondary | ICD-10-CM | POA: Diagnosis not present

## 2021-11-22 DIAGNOSIS — M25552 Pain in left hip: Secondary | ICD-10-CM | POA: Diagnosis not present

## 2021-11-22 DIAGNOSIS — C44311 Basal cell carcinoma of skin of nose: Secondary | ICD-10-CM | POA: Diagnosis not present

## 2021-11-22 DIAGNOSIS — Z01411 Encounter for gynecological examination (general) (routine) with abnormal findings: Secondary | ICD-10-CM | POA: Diagnosis not present

## 2021-11-22 DIAGNOSIS — L720 Epidermal cyst: Secondary | ICD-10-CM | POA: Diagnosis not present

## 2021-11-22 DIAGNOSIS — M25551 Pain in right hip: Secondary | ICD-10-CM | POA: Diagnosis not present

## 2021-11-22 DIAGNOSIS — Z1231 Encounter for screening mammogram for malignant neoplasm of breast: Secondary | ICD-10-CM | POA: Diagnosis not present

## 2021-12-03 ENCOUNTER — Other Ambulatory Visit (HOSPITAL_COMMUNITY): Payer: Self-pay

## 2021-12-07 ENCOUNTER — Encounter: Payer: Self-pay | Admitting: Family Medicine

## 2021-12-07 ENCOUNTER — Ambulatory Visit (INDEPENDENT_AMBULATORY_CARE_PROVIDER_SITE_OTHER): Payer: 59 | Admitting: Family Medicine

## 2021-12-07 ENCOUNTER — Other Ambulatory Visit (HOSPITAL_BASED_OUTPATIENT_CLINIC_OR_DEPARTMENT_OTHER): Payer: Self-pay

## 2021-12-07 VITALS — BP 105/70 | HR 75 | Temp 97.6°F | Ht 66.54 in | Wt 162.0 lb

## 2021-12-07 DIAGNOSIS — R7303 Prediabetes: Secondary | ICD-10-CM

## 2021-12-07 DIAGNOSIS — Z23 Encounter for immunization: Secondary | ICD-10-CM | POA: Diagnosis not present

## 2021-12-07 DIAGNOSIS — M259 Joint disorder, unspecified: Secondary | ICD-10-CM | POA: Diagnosis not present

## 2021-12-07 DIAGNOSIS — E538 Deficiency of other specified B group vitamins: Secondary | ICD-10-CM | POA: Diagnosis not present

## 2021-12-07 DIAGNOSIS — C439 Malignant melanoma of skin, unspecified: Secondary | ICD-10-CM | POA: Diagnosis not present

## 2021-12-07 DIAGNOSIS — E2839 Other primary ovarian failure: Secondary | ICD-10-CM

## 2021-12-07 DIAGNOSIS — D1800 Hemangioma unspecified site: Secondary | ICD-10-CM

## 2021-12-07 DIAGNOSIS — K635 Polyp of colon: Secondary | ICD-10-CM | POA: Diagnosis not present

## 2021-12-07 DIAGNOSIS — E7849 Other hyperlipidemia: Secondary | ICD-10-CM

## 2021-12-07 DIAGNOSIS — Z79899 Other long term (current) drug therapy: Secondary | ICD-10-CM | POA: Diagnosis not present

## 2021-12-07 DIAGNOSIS — Z0000 Encounter for general adult medical examination without abnormal findings: Secondary | ICD-10-CM

## 2021-12-07 DIAGNOSIS — G4733 Obstructive sleep apnea (adult) (pediatric): Secondary | ICD-10-CM

## 2021-12-07 LAB — CBC WITH DIFFERENTIAL/PLATELET
Basophils Absolute: 0.1 10*3/uL (ref 0.0–0.1)
Basophils Relative: 0.7 % (ref 0.0–3.0)
Eosinophils Absolute: 0.1 10*3/uL (ref 0.0–0.7)
Eosinophils Relative: 2.2 % (ref 0.0–5.0)
HCT: 43.5 % (ref 36.0–46.0)
Hemoglobin: 14.3 g/dL (ref 12.0–15.0)
Lymphocytes Relative: 39 % (ref 12.0–46.0)
Lymphs Abs: 2.6 10*3/uL (ref 0.7–4.0)
MCHC: 32.9 g/dL (ref 30.0–36.0)
MCV: 94.4 fl (ref 78.0–100.0)
Monocytes Absolute: 0.5 10*3/uL (ref 0.1–1.0)
Monocytes Relative: 7.2 % (ref 3.0–12.0)
Neutro Abs: 3.4 10*3/uL (ref 1.4–7.7)
Neutrophils Relative %: 50.9 % (ref 43.0–77.0)
Platelets: 235 10*3/uL (ref 150.0–400.0)
RBC: 4.61 Mil/uL (ref 3.87–5.11)
RDW: 12.9 % (ref 11.5–15.5)
WBC: 6.7 10*3/uL (ref 4.0–10.5)

## 2021-12-07 LAB — COMPREHENSIVE METABOLIC PANEL
ALT: 11 U/L (ref 0–35)
AST: 13 U/L (ref 0–37)
Albumin: 4.5 g/dL (ref 3.5–5.2)
Alkaline Phosphatase: 51 U/L (ref 39–117)
BUN: 14 mg/dL (ref 6–23)
CO2: 29 mEq/L (ref 19–32)
Calcium: 9.4 mg/dL (ref 8.4–10.5)
Chloride: 103 mEq/L (ref 96–112)
Creatinine, Ser: 0.83 mg/dL (ref 0.40–1.20)
GFR: 76.59 mL/min (ref >60.00)
Glucose, Bld: 109 mg/dL — ABNORMAL HIGH (ref 70–99)
Potassium: 4 mEq/L (ref 3.5–5.1)
Sodium: 140 mEq/L (ref 135–145)
Total Bilirubin: 0.6 mg/dL (ref 0.2–1.2)
Total Protein: 6.5 g/dL (ref 6.0–8.3)

## 2021-12-07 LAB — VITAMIN B12: Vitamin B-12: 189 pg/mL — ABNORMAL LOW (ref 211–911)

## 2021-12-07 LAB — LIPID PANEL
Cholesterol: 176 mg/dL (ref 0–200)
HDL: 57.4 mg/dL (ref >39.00)
LDL Cholesterol: 98 mg/dL (ref 0–99)
NonHDL: 118.29
Total CHOL/HDL Ratio: 3
Triglycerides: 101 mg/dL (ref 0.0–149.0)
VLDL: 20.2 mg/dL (ref 0.0–40.0)

## 2021-12-07 LAB — HEMOGLOBIN A1C: Hgb A1c MFr Bld: 6.1 % (ref 4.6–6.5)

## 2021-12-07 LAB — TSH: TSH: 1.43 u[IU]/mL (ref 0.35–5.50)

## 2021-12-07 MED ORDER — COMIRNATY 30 MCG/0.3ML IM SUSY
PREFILLED_SYRINGE | INTRAMUSCULAR | 0 refills | Status: DC
  Filled 2021-12-07: qty 0.3, 1d supply, fill #0

## 2021-12-07 NOTE — Progress Notes (Signed)
Patient ID: Jennifer Sharp, female  DOB: January 23, 1961, 61 y.o.   MRN: [id number] Patient Care Team    Relationship Specialty Notifications Start End  Ma Hillock, DO PCP - General Family Medicine  09/13/15   Milus Banister, MD Attending Physician Gastroenterology  09/13/15   [place], Heather, [place]  Optometry  09/13/15   Waynetta Sandy, MD Consulting Physician Vascular Surgery  05/12/17   Mardelle Matte, MD Referring Physician Dermatology  12/01/18   Daleen Bo, PT Physical Therapist Physical Therapy  10/31/20   Waymon Amato, MD Consulting Physician Obstetrics and Gynecology  12/07/21     Chief Complaint  Patient presents with   Annual Exam    Pt is not fasting    Subjective:  Jennifer Sharp is a 61 y.o.  Female  present for CPE  All past medical history, surgical history, allergies, family history, immunizations, medications and social history were updated in the electronic medical record today. All recent labs, ED visits and hospitalizations within the last year were reviewed.  Health maintenance:  Colonoscopy:completed 2021, by Dr. Ardis Hughs, resutls normal. follow up 5 yr (Fhx). Personal history of polyps  Mammogram: completed: records requested At gyn office, reported normal. Cervical cancer screening: last pap: 11/2017, results: normal, completed by: Dr. Leo Grosser (GYN). Uterine polyp removed 2017. Immunizations: tdap 08/2015 utd, Influenza UTD receives through work. (encouraged yearly).  Shingrix completed 2020.   Infectious disease screening: hiv and hep c completed DEXA: 06/13/2016--> normal, Fhx osteoporosis, estrogen deficient. Long term use of nexium. Vit D normal.> order  Replaced Patient has a Dental home. Hospitalizations/ED visits: reviewed      12/07/2021    1:29 PM 12/06/2019    8:32 AM 12/01/2018    9:05 AM 08/13/2017    8:58 AM 05/31/2016    8:12 AM  Depression screen PHQ 2/9  Decreased Interest 0 0 0 0 0  Down, Depressed, Hopeless 0 0 1 0 0   PHQ - 2 Score 0 0 1 0 0  Altered sleeping 2      Tired, decreased energy 1      Change in appetite 1      Feeling bad or failure about yourself  0      Trouble concentrating 0      Moving slowly or fidgety/restless 0      Suicidal thoughts 0      PHQ-9 Score 4          12/07/2021    1:30 PM  GAD 7 : Generalized Anxiety Score  Nervous, Anxious, on Edge 1  Control/stop worrying 1  Worry too much - different things 1  Trouble relaxing 0  Restless 0  Easily annoyed or irritable 0  Afraid - awful might happen 0  Total GAD 7 Score 3    Immunization History  Administered Date(s) Administered   Influenza Split 10/28/2016   Influenza Whole 11/19/2010   Influenza,inj,Quad PF,6+ Mos 10/24/2021, 12/07/2021   Influenza,inj,quad, With Preservative 11/19/2014   Influenza-Unspecified 11/19/2014, 11/08/2016, 11/05/2017, 12/02/2020   PFIZER Comirnaty(Gray Top)Covid-19 Tri-Sucrose Vaccine 06/29/2020   PFIZER(Purple Top)SARS-COV-2 Vaccination 02/08/2019, 02/26/2019, 11/19/2019   Pfizer Covid-19 Vaccine Bivalent Booster 63yr & up 11/15/2020   Tdap 09/13/2015   Zoster Recombinat (Shingrix) 12/16/2017, 04/20/2018     Past Medical History:  Diagnosis Date   Ankle swelling    occasional    Basal cell carcinoma of nose 02/09/2020   Cavernoma 05/2017   Colon polyps 67/02/6965  Complication of anesthesia  urinary retention   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   Cyst of vagina 02/09/2020   DUB (dysfunctional uterine bleeding) 2016/2017   GYN did endometrial bx (BENIGN PATH) + pap 04/11/15; u/s to follow   Edema    Endometrial polyp    GERD (gastroesophageal reflux disease) 07/25/2010   with schatzki's ring on endo 07/2010   History of recurrent UTIs 07/25/2010   Melanoma ([place]) 2006   on right arm   Miscarriage    OSA on CPAP 10/02/2011   Premenstrual migraine    headaches since bleeding started   Schatzki's ring    Seasonal  allergic rhinitis    SI (sacroiliac) joint inflammation (HCC)    Slow transit constipation 07/19/2012   Tachycardia    H/O transient  tach. as a chlid    Umbilical hernia 08/23/1948   Unspecified constipation 07/19/2012   Allergies  Allergen Reactions   Anticoagulant Compound    Asa [Aspirin]    Nsaids    Other Other (See Comments)    Anticoagulants - Risk of bleeding due to multiple cavernoma syndrome   Past Surgical History:  Procedure Laterality Date   COLONOSCOPY  02/2010   5 yr recall (FH colon cancer)   DILATATION & CURRETTAGE/HYSTEROSCOPY WITH RESECTOCOPE N/A 04/21/2015   Path: benign. Procedure: [place];  Surgeon: Eldred Manges, MD;  Location: [facility];  Service: Gynecology;  Laterality: N/A;   DILATION AND CURETTAGE OF UTERUS  [phone]   DILATION AND EVACUATION N/A 04/21/2015   Done for DUB/Menorrhagia.  Procedure: DILATATION AND EVACUATION;  Surgeon: Eldred Manges, MD;  Location: [facility];  Service: Gynecology;  Laterality: N/A;   EXPLORATORY LAPAROTOMY     HERNIA REPAIR     left inguinal   MELANOMA EXCISION  2006   R arm   oral surgeries  70's and 80's   TONSILLECTOMY     childhood   VENTRAL HERNIA REPAIR N/A 10/29/2012   Procedure: LAPAROSCOPIC VENTRAL HERNIA WITH MESH;  Surgeon: Shann Medal, MD;  Location: [facility];  Service: General;  Laterality: N/A;   Family History  Problem Relation Age of Onset   Hyperlipidemia Mother    Hypertension Mother    Stroke Mother    Cancer Father 14       ish, colon- remission/ skin cancer   Colon cancer Father    Other Brother        stent placed//AVM   Hypertension Brother    Hyperlipidemia Brother    Heart disease Brother        2 cardiac stents   Other Daughter        heart ablation/ heart arrythmia   ADD / ADHD Daughter        ADD   Heart disease Daughter 13       tachyarrythmia, scheduled for ablation    ADD / ADHD Son        ADD   Cancer Paternal Uncle        colon    Diabetes Maternal Grandmother    Stroke Maternal Grandmother    Hypertension Maternal Grandmother    Alzheimer's disease Maternal Grandmother    Diabetes Maternal Grandfather    Other Maternal Grandfather        cardiac disease   Cancer Paternal Grandmother        colon   Colon cancer Paternal Grandmother    Allergy (severe) Brother  smoker   Other Brother        respiratory problems   Colon cancer Maternal Uncle    Esophageal cancer Neg Hx    Rectal cancer Neg Hx    Stomach cancer Neg Hx    Social History   Social History Narrative   Married. Right handed.   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    Wears seatbelt. Smoke detector in the home.    Caffeine use: 1 cup coffee per day.     Allergies as of 12/07/2021       Reactions   Anticoagulant Compound    Asa [aspirin]    Nsaids    Other Other (See Comments)   Anticoagulants - Risk of bleeding due to multiple cavernoma syndrome        Medication List        Accurate as of December 07, 2021  2:39 PM. If you have any questions, ask your nurse or doctor.          STOP taking these medications    acetaminophen 500 MG tablet Commonly known as: TYLENOL Stopped by: Howard Pouch, DO   acetaminophen-codeine 300-30 MG tablet Commonly known as: TYLENOL #3 Stopped by: Howard Pouch, DO   Carestart COVID-19 Home Test Kit Generic drug: COVID-19 At Home Antigen Test Stopped by: Howard Pouch, DO   desogestrel-ethinyl estradiol 0.15-0.02/0.01 MG (21/5) tablet Commonly known as: MIRCETTE Stopped by: Howard Pouch, DO   famotidine 20 MG tablet Commonly known as: PEPCID Stopped by: Howard Pouch, DO   sodium chloride 0.65 % Soln nasal spray Commonly known as: OCEAN Stopped by: Howard Pouch, DO       TAKE these medications    Comirnaty syringe Generic drug: COVID-19 mRNA vaccine 2023-2024 Inject into the muscle.   cyanocobalamin 1000 MCG tablet Commonly known as: VITAMIN B12 Take 1,000 mcg by mouth  daily.        All past medical history, surgical history, allergies, family history, immunizations andmedications were updated in the EMR today and reviewed under the history and medication portions of their EMR.     No results found for this or any previous visit (from the past 2160 hour(s)).   ROS 14 pt review of systems performed and negative (unless mentioned in an HPI)  Objective: BP 105/70   Pulse 75   Temp 97.6 F (36.4 C)   Ht 5' 6.54" (1.69 m)   Wt 162 lb (73.5 kg)   SpO2 97%   BMI 25.73 kg/m  Physical Exam Vitals and nursing note reviewed.  Constitutional:      General: She is not in acute distress.    Appearance: Normal appearance. She is not ill-appearing or toxic-appearing.  HENT:     Head: Normocephalic and atraumatic.     Right Ear: Tympanic membrane, ear canal and external ear normal. There is no impacted cerumen.     Left Ear: Tympanic membrane, ear canal and external ear normal. There is no impacted cerumen.     Nose: No congestion or rhinorrhea.     Mouth/Throat:     Mouth: Mucous membranes are moist.     Pharynx: Oropharynx is clear. No oropharyngeal exudate or posterior oropharyngeal erythema.  Eyes:     General: No scleral icterus.       Right eye: No discharge.        Left eye: No discharge.     Extraocular Movements: Extraocular movements intact.     Conjunctiva/sclera: Conjunctivae normal.     Pupils:  Pupils are equal, round, and reactive to light.  Cardiovascular:     Rate and Rhythm: Normal rate and regular rhythm.     Pulses: Normal pulses.     Heart sounds: Normal heart sounds. No murmur heard.    No friction rub. No gallop.  Pulmonary:     Effort: Pulmonary effort is normal. No respiratory distress.     Breath sounds: Normal breath sounds. No stridor. No wheezing, rhonchi or rales.  Chest:     Chest wall: No tenderness.  Abdominal:     General: Abdomen is flat. Bowel sounds are normal. There is no distension.     Palpations:  Abdomen is soft. There is no mass.     Tenderness: There is no abdominal tenderness. There is no right CVA tenderness, left CVA tenderness, guarding or rebound.     Hernia: No hernia is present.  Musculoskeletal:        General: No swelling, tenderness or deformity. Normal range of motion.     Cervical back: Normal range of motion and neck supple. No rigidity or tenderness.     Right lower leg: No edema.     Left lower leg: No edema.  Lymphadenopathy:     Cervical: No cervical adenopathy.  Skin:    General: Skin is warm and dry.     Coloration: Skin is not jaundiced or pale.     Findings: No bruising, erythema, lesion or rash.  Neurological:     General: No focal deficit present.     Mental Status: She is alert and oriented to person, place, and time. Mental status is at baseline.     Cranial Nerves: No cranial nerve deficit.     Sensory: No sensory deficit.     Motor: No weakness.     Coordination: Coordination normal.     Gait: Gait normal.     Deep Tendon Reflexes: Reflexes normal.  Psychiatric:        Mood and Affect: Mood normal.        Behavior: Behavior normal.        Thought Content: Thought content normal.        Judgment: Judgment normal.        No results found.  Assessment/plan: ILIANI VEJAR is a 61 y.o. female present for CPE  hyperlipidemia - CBC with Differential/Platelet - Lipid panel - TSH - Comprehensive metabolic panel OSA on CPAP Compliant  Encounter for long-term (current) use of medications - Hemoglobin A1c B12 deficiency - Vitamin B12 6. Prediabetes A1c collected today Estrogen deficiency - DG Bone Density; Future Cavernoma- INCREASED BLEEDING RISK Avoid NSAIDS and blood thinners.  Polyp of colon, unspecified part of colon, unspecified type UTD Malignant melanoma, unspecified site [facility]) Routine follow-ups with derm Left sacroiliac dysfunction: Intermittent flares of pain in left sacroiliac area. NSAIDs contraindicated Discussed  sports med referral for OMT and she is agreeable to this today. Routine general medical examination at a health care facility Colonoscopy:completed 2021, by Dr. Ardis Hughs, resutls normal. follow up 5 yr (Fhx). Personal history of polyps  Mammogram: completed: records requested At gyn office, reported normal. Cervical cancer screening: last pap: 11/2017, results: normal, completed by: Dr. Leo Grosser (GYN). Uterine polyp removed 2017. Immunizations: tdap 08/2015 utd, Influenza UTD receives through work. (encouraged yearly).  Shingrix completed 2020.   Infectious disease screening: hiv and hep c completed DEXA: 06/13/2016--> normal, Fhx osteoporosis, estrogen deficient. Long term use of nexium. Vit D normal.> order  Replaced Patient was encouraged to exercise  greater than 150 minutes a week. Patient was encouraged to choose a diet filled with fresh fruits and vegetables, and lean meats. AVS provided to patient today for education/recommendation on gender specific health and safety maintenance. Return in about 1 year (around 12/09/2022) for cpe (20 min).  Orders Placed This Encounter  Procedures   DG Bone Density   Flu Vaccine QUAD 73moIM (Fluarix, Fluzone & Alfiuria Quad PF)   CBC with Differential/Platelet   Hemoglobin A1c   Lipid panel   TSH   Comprehensive metabolic panel   Vitamin BT34  Ambulatory referral to Sports Medicine    Orders Placed This Encounter  Procedures   DG Bone Density   Flu Vaccine QUAD 65moM (Fluarix, Fluzone & Alfiuria Quad PF)   CBC with Differential/Platelet   Hemoglobin A1c   Lipid panel   TSH   Comprehensive metabolic panel   Vitamin [id number] Ambulatory referral to Sports Medicine   No orders of the defined types were placed in this encounter.  Referral Orders         Ambulatory referral to Sports Medicine       Electronically signed by: ReHoward PouchDO [place]

## 2021-12-07 NOTE — Patient Instructions (Addendum)

## 2021-12-10 ENCOUNTER — Telehealth: Payer: Self-pay | Admitting: Family Medicine

## 2021-12-10 NOTE — Telephone Encounter (Signed)
Spoke with patient regarding results/recommendations.  

## 2021-12-10 NOTE — Telephone Encounter (Signed)
Spoke with patient regarding results/recommendations. Pt states she has not been doing her b12 po and will start back. She does not want to take injections and wants to also know if there is a high dose b12 that she may be able to take.

## 2021-12-10 NOTE — Telephone Encounter (Signed)
Please call patient Liver, kidney and thyroid function are normal Blood cell counts and electrolytes are normal Diabetes screening/A1c is the prediabetic range with a glucose of 109 and a A1c of 6.1.  Level has been consistent over the last 3-4 years. Cholesterol panel looks good and is at goal for her. B12 levels are extremely low again at 189.  She likely would respond better to B12 injections every 3-4 weeks.   If she would like to have these completed here please set up for nurse visit for B12 injections every 3-4 weeks indefinitely.   If she would like to self administer, we can call these in for her at home (she has a medical background).

## 2021-12-10 NOTE — Telephone Encounter (Signed)
There are many doses of B12 available on the market.  They are all over-the-counter. High dose B12 is considered between 2500-5000 mcg daily

## 2021-12-28 ENCOUNTER — Ambulatory Visit: Payer: 59 | Admitting: Sports Medicine

## 2022-04-02 ENCOUNTER — Other Ambulatory Visit: Payer: Self-pay | Admitting: Family Medicine

## 2022-04-02 DIAGNOSIS — Z1231 Encounter for screening mammogram for malignant neoplasm of breast: Secondary | ICD-10-CM

## 2022-04-24 ENCOUNTER — Ambulatory Visit (INDEPENDENT_AMBULATORY_CARE_PROVIDER_SITE_OTHER)
Admission: RE | Admit: 2022-04-24 | Discharge: 2022-04-24 | Disposition: A | Discharge status: Home / Self Care | Payer: 59 | Source: Ambulatory Visit | Attending: Family Medicine | Admitting: Family Medicine

## 2022-04-24 DIAGNOSIS — E2839 Other primary ovarian failure: Secondary | ICD-10-CM | POA: Diagnosis not present

## 2022-04-25 ENCOUNTER — Telehealth: Payer: Self-pay | Admitting: Family Medicine

## 2022-04-25 DIAGNOSIS — M85859 Other specified disorders of bone density and structure, unspecified thigh: Secondary | ICD-10-CM

## 2022-04-25 NOTE — Telephone Encounter (Signed)
Spoke with patient regarding results/recommendations.  

## 2022-04-25 NOTE — Telephone Encounter (Signed)
Please inform patient Labs returned with lower than normal bone density in the osteopenia range Score of -1.7. This is not osteoporosis or brittle bones, but Jennifer Sharp does have decreased density.   Osteopenia: 1.) Drink alcohol in moderation only 2.) Decrease caffeine consumption to no  More than 2.5 cups of coffee a day  3.) Exercise: weight bearing (walking counts), strength and balance training. 4.) No smoking.  5.) Sunlight/Ultraviolet light exposure 30 minutes a day/5 days a week. 6.) Vitamin D supplement 800 iu a day (at least, more if told otherwise) and calcium 1000-1200 mg a day (depending on diet ).   We will monitor bone density every 2-3 years now that Jennifer Sharp is entered into the osteopenia range.

## 2022-05-27 ENCOUNTER — Encounter: Payer: Self-pay | Admitting: Family Medicine

## 2022-05-27 ENCOUNTER — Telehealth (INDEPENDENT_AMBULATORY_CARE_PROVIDER_SITE_OTHER): Payer: 59 | Admitting: Family Medicine

## 2022-05-27 DIAGNOSIS — N390 Urinary tract infection, site not specified: Secondary | ICD-10-CM | POA: Diagnosis not present

## 2022-05-27 DIAGNOSIS — E538 Deficiency of other specified B group vitamins: Secondary | ICD-10-CM | POA: Diagnosis not present

## 2022-05-27 MED ORDER — CIPROFLOXACIN HCL 500 MG PO TABS: ORAL_TABLET | ORAL | 0 refills | Status: DC

## 2022-05-27 NOTE — Progress Notes (Signed)
VIRTUAL VISIT VIA VIDEO  I connected with Jennifer Sharp on 05/27/22 at  9:00 AM EDT by a video enabled telemedicine application and verified that I am speaking with the correct person using two identifiers. Location patient: Home Location provider: [facility] Persons participating in the virtual visit: Patient, Dr. Claiborne BillingsKuneff and Ivonne AndrewV. Smith, CMA  I discussed the limitations of evaluation and management by telemedicine and the availability of in person appointments. The patient expressed understanding and agreed to proceed.     Jennifer Sharp , 01/18/1961, 62 y.o., female MRN: [id number] Patient Care Team    Relationship Specialty Notifications Start End  Natalia LeatherwoodKuneff, Jameka Ivie A, DO PCP - General Family Medicine  09/13/15   Rachael FeeJacobs, Daniel P, MD Attending Physician Gastroenterology  09/13/15   Burundiman, Heather, OD  Optometry  09/13/15   Maeola Harmanain, Brandon Christopher, MD Consulting Physician Vascular Surgery  05/12/17   Pavlis, Darcel SmallingMichelle Bernadette, MD Referring Physician Dermatology  12/01/18   Zebedee IbaZhou, Alan, PT Physical Therapist Physical Therapy  10/31/20   Hoover BrownsKulwa, Ema, MD Consulting Physician Obstetrics and Gynecology  12/07/21     Chief Complaint  Patient presents with   preventative medication    Wants to discuss preventative medication for UTI will be going to [place] for a month and has frequent UTIs and b12     Subjective: Jennifer Sharp is a 62 y.o. Pt presents for an OV discuss her recurrent UTI history.  She is leaving the country to go to [place] soon.  Her stay will be 4 weeks and she is concerned she will have a recurrent UTI while she is out of the country.  Her urine cultures have been pansensitive E. coli in the past.  She has tolerated Cipro for treatment.  B12 deficiency: Patient was diagnosed with B12 deficiency and B12 levels were 189 in October.  She has been supplementing with B12 2500 mcg sublingual daily.  She is wondering if she is going to need the injections, and  if she is could she give this to herself.     05/27/2022    8:59 AM 12/07/2021    1:29 PM 12/06/2019    8:32 AM 12/01/2018    9:05 AM 08/13/2017    8:58 AM  Depression screen PHQ 2/9  Decreased Interest 0 0 0 0 0  Down, Depressed, Hopeless 0 0 0 1 0  PHQ - 2 Score 0 0 0 1 0  Altered sleeping  2     Tired, decreased energy  1     Change in appetite  1     Feeling bad or failure about yourself   0     Trouble concentrating  0     Moving slowly or fidgety/restless  0     Suicidal thoughts  0     PHQ-9 Score  4       Allergies  Allergen Reactions   Anticoagulant Compound    Asa [Aspirin]    Nsaids    Other Other (See Comments)    Anticoagulants - Risk of bleeding due to multiple cavernoma syndrome   Social History   Social History Narrative   Married. Right handed.   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    Wears seatbelt. Smoke detector in the home.    Caffeine use: 1 cup coffee per day.    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   Cyst of vagina 02/09/2020   DUB (dysfunctional uterine bleeding) 2016/2017   GYN did endometrial bx (BENIGN PATH) + pap 04/11/15; u/s to follow   Edema    Endometrial polyp    GERD (gastroesophageal reflux disease) 07/25/2010   with schatzki's ring on endo 07/2010   History of recurrent UTIs 07/25/2010   Melanoma 2006   on right arm   Miscarriage    OSA on CPAP 10/02/2011   Premenstrual migraine    headaches since bleeding started   Schatzki's ring    Seasonal allergic rhinitis    SI (sacroiliac) joint inflammation    Slow transit constipation 07/19/2012   Tachycardia    H/O transient  tach. as a chlid    Umbilical hernia 07/25/2010   Unspecified constipation 07/19/2012   Past Surgical  History:  Procedure Laterality Date   COLONOSCOPY  02/2010   5 yr recall (FH colon cancer)   DILATATION & CURRETTAGE/HYSTEROSCOPY WITH RESECTOCOPE N/A 04/21/2015   Path: benign. Procedure: DILATATION & CURETTAGE/HYSTEROSCOPY WITH RESECTOCOPE;  Surgeon: Hal Morales, MD;  Location: [facility];  Service: Gynecology;  Laterality: N/A;   DILATION AND CURETTAGE OF UTERUS  [phone]   DILATION AND EVACUATION N/A 04/21/2015   Done for DUB/Menorrhagia.  Procedure: DILATATION AND EVACUATION;  Surgeon: Hal Morales, MD;  Location: [facility];  Service: Gynecology;  Laterality: N/A;   EXPLORATORY LAPAROTOMY     HERNIA REPAIR     left inguinal   MELANOMA EXCISION  2006   R arm   oral surgeries  70's and 80's   TONSILLECTOMY     childhood   VENTRAL HERNIA REPAIR N/A 10/29/2012   Procedure: LAPAROSCOPIC VENTRAL HERNIA WITH MESH;  Surgeon: Kandis Cocking, MD;  Location: [facility];  Service: General;  Laterality: N/A;   Family History  Problem Relation Age of Onset   Hyperlipidemia Mother    Hypertension Mother    Stroke Mother    Cancer Father 71       ish, colon- remission/ skin cancer   Colon cancer Father    Other Brother        stent placed//AVM   Hypertension Brother    Hyperlipidemia Brother    Heart disease Brother        2 cardiac stents   Other Daughter        heart ablation/ heart arrythmia   ADD / ADHD Daughter        ADD   Heart disease Daughter 74       tachyarrythmia, scheduled for ablation    ADD / ADHD Son        ADD   Cancer Paternal Uncle        colon   Diabetes Maternal Grandmother    Stroke Maternal Grandmother    Hypertension Maternal Grandmother    Alzheimer's disease Maternal Grandmother    Diabetes Maternal Grandfather    Other Maternal Grandfather        cardiac disease   Cancer Paternal Grandmother        colon   Colon cancer Paternal Grandmother    Allergy (severe) Brother        smoker   Other Brother        respiratory problems   Colon  cancer  Maternal Uncle    Esophageal cancer Neg Hx    Rectal cancer Neg Hx    Stomach cancer Neg Hx    Allergies as of 05/27/2022       Reactions   Anticoagulant Compound    Asa [aspirin]    Nsaids    Other Other (See Comments)   Anticoagulants - Risk of bleeding due to multiple cavernoma syndrome        Medication List        Accurate as of May 27, 2022  9:15 AM. If you have any questions, ask your nurse or doctor.          STOP taking these medications    Comirnaty syringe Generic drug: COVID-19 mRNA vaccine 2023-2024 Stopped by: Felix Pacini, DO       TAKE these medications    CALCIUM 600-D PO Take by mouth 2 (two) times daily.   ciprofloxacin 500 MG tablet Commonly known as: Cipro 1 tab BID for 3 days at onset of symptoms, rpt if needed Started by: Felix Pacini, DO   cyanocobalamin 1000 MCG tablet Commonly known as: VITAMIN B12 Take 1,000 mcg by mouth daily.   PEPCID PO Take by mouth.        All past medical history, surgical history, allergies, family history, immunizations andmedications were updated in the EMR today and reviewed under the history and medication portions of their EMR.     ROS Negative, with the exception of above mentioned in HPI   Objective:  There were no vitals taken for this visit. There is no height or weight on file to calculate BMI. Physical Exam Vitals and nursing note reviewed.  Constitutional:      General: She is not in acute distress.    Appearance: Normal appearance. She is not toxic-appearing.  HENT:     Head: Normocephalic and atraumatic.  Eyes:     General: No scleral icterus.       Right eye: No discharge.        Left eye: No discharge.     Conjunctiva/sclera: Conjunctivae normal.  Pulmonary:     Effort: Pulmonary effort is normal.  Musculoskeletal:     Cervical back: Normal range of motion.  Skin:    Findings: No rash.  Neurological:     Mental Status: She is alert and oriented to person, place, and  time. Mental status is at baseline.  Psychiatric:        Mood and Affect: Mood normal.        Behavior: Behavior normal.        Thought Content: Thought content normal.        Judgment: Judgment normal.      No results found. No results found. No results found for this or any previous visit (from the past 24 hour(s)).  Assessment/Plan: HENRENE SUBER is a 62 y.o. female present for OV for  B12 deficiency Continue sublingual 2500 mcg daily. Place lab for B12 when she returns from [place].  If she is adequately supplemented with the sublingual only, she will not need injections. We discussed today that some people still require a every 4-week injection along with sublingual daily to keep levels into normal range.  We will not know how she responded to sublingual only until she is retested. We did discuss she will need some form of B12 whether sublingual or injections indefinitely, since her labs proved she does not absorb it well out of her diet going back  as far as 2017 with deficiency. Patient reported understanding. - B12; Future  Recurrent UTI Patient with history of recurrent UTIs usually pansensitive E. coli. I have called in a prescription for Cipro 500 mg twice daily x 2 doses if needed while on vacation.  She has tolerated this medication in the past.  She understands not to start medication if she is not having symptoms.  Reviewed expectations re: course of current medical issues. Discussed self-management of symptoms. Outlined signs and symptoms indicating need for more acute intervention. Patient verbalized understanding and all questions were answered. Patient received an After-Visit Summary.    Orders Placed This Encounter  Procedures   B12   Meds ordered this encounter  Medications   ciprofloxacin (CIPRO) 500 MG tablet    Sig: 1 tab BID for 3 days at onset of symptoms, rpt if needed    Dispense:  12 tablet    Refill:  0   Referral Orders  No referral(s)  requested today     Note is dictated utilizing voice recognition software. Although note has been proof read prior to signing, occasional typographical errors still can be missed. If any questions arise, please do not hesitate to call for verification.   electronically signed by:  Felix Pacini, DO  [facility] - OR

## 2022-05-27 NOTE — Patient Instructions (Addendum)
  Please remember to make a lab appointment only when you return from [place] to recheck your B12.  I have placed this order for you.  Will be able to guide you on B12 dosage and if you will require injections after lab result received. Hope you have a wonderful time in [place]

## 2022-07-10 DIAGNOSIS — D1801 Hemangioma of skin and subcutaneous tissue: Secondary | ICD-10-CM | POA: Diagnosis not present

## 2022-07-10 DIAGNOSIS — Z85820 Personal history of malignant melanoma of skin: Secondary | ICD-10-CM | POA: Diagnosis not present

## 2022-07-10 DIAGNOSIS — L570 Actinic keratosis: Secondary | ICD-10-CM | POA: Diagnosis not present

## 2022-07-10 DIAGNOSIS — D2270 Melanocytic nevi of unspecified lower limb, including hip: Secondary | ICD-10-CM | POA: Diagnosis not present

## 2022-07-10 DIAGNOSIS — D2260 Melanocytic nevi of unspecified upper limb, including shoulder: Secondary | ICD-10-CM | POA: Diagnosis not present

## 2022-07-10 DIAGNOSIS — Z85828 Personal history of other malignant neoplasm of skin: Secondary | ICD-10-CM | POA: Diagnosis not present

## 2022-07-10 DIAGNOSIS — D225 Melanocytic nevi of trunk: Secondary | ICD-10-CM | POA: Diagnosis not present

## 2022-07-10 DIAGNOSIS — L814 Other melanin hyperpigmentation: Secondary | ICD-10-CM | POA: Diagnosis not present

## 2022-07-10 DIAGNOSIS — Q828 Other specified congenital malformations of skin: Secondary | ICD-10-CM | POA: Diagnosis not present

## 2022-07-10 DIAGNOSIS — L821 Other seborrheic keratosis: Secondary | ICD-10-CM | POA: Diagnosis not present

## 2022-07-22 ENCOUNTER — Other Ambulatory Visit (HOSPITAL_COMMUNITY): Payer: Self-pay

## 2022-08-20 DIAGNOSIS — Z973 Presence of spectacles and contact lenses: Secondary | ICD-10-CM | POA: Diagnosis not present

## 2022-08-20 DIAGNOSIS — Z85828 Personal history of other malignant neoplasm of skin: Secondary | ICD-10-CM | POA: Diagnosis not present

## 2022-08-20 DIAGNOSIS — E785 Hyperlipidemia, unspecified: Secondary | ICD-10-CM | POA: Diagnosis not present

## 2022-08-20 DIAGNOSIS — Z8249 Family history of ischemic heart disease and other diseases of the circulatory system: Secondary | ICD-10-CM | POA: Diagnosis not present

## 2022-08-20 DIAGNOSIS — Z809 Family history of malignant neoplasm, unspecified: Secondary | ICD-10-CM | POA: Diagnosis not present

## 2022-08-20 DIAGNOSIS — K5900 Constipation, unspecified: Secondary | ICD-10-CM | POA: Diagnosis not present

## 2022-08-20 DIAGNOSIS — Z823 Family history of stroke: Secondary | ICD-10-CM | POA: Diagnosis not present

## 2022-08-20 DIAGNOSIS — M810 Age-related osteoporosis without current pathological fracture: Secondary | ICD-10-CM | POA: Diagnosis not present

## 2022-08-20 DIAGNOSIS — K219 Gastro-esophageal reflux disease without esophagitis: Secondary | ICD-10-CM | POA: Diagnosis not present

## 2022-08-20 DIAGNOSIS — Z833 Family history of diabetes mellitus: Secondary | ICD-10-CM | POA: Diagnosis not present

## 2022-08-20 DIAGNOSIS — G4733 Obstructive sleep apnea (adult) (pediatric): Secondary | ICD-10-CM | POA: Diagnosis not present

## 2022-08-26 ENCOUNTER — Other Ambulatory Visit (HOSPITAL_COMMUNITY): Payer: Self-pay

## 2022-09-13 DIAGNOSIS — G4733 Obstructive sleep apnea (adult) (pediatric): Secondary | ICD-10-CM | POA: Diagnosis not present

## 2022-09-17 ENCOUNTER — Ambulatory Visit: Payer: 59 | Admitting: Nurse Practitioner

## 2022-09-17 ENCOUNTER — Other Ambulatory Visit (HOSPITAL_COMMUNITY): Payer: Self-pay

## 2022-09-17 ENCOUNTER — Encounter: Payer: Self-pay | Admitting: Nurse Practitioner

## 2022-09-17 VITALS — BP 104/62 | HR 88 | Ht 65.25 in | Wt 162.0 lb

## 2022-09-17 DIAGNOSIS — K219 Gastro-esophageal reflux disease without esophagitis: Secondary | ICD-10-CM | POA: Diagnosis not present

## 2022-09-17 DIAGNOSIS — R1010 Upper abdominal pain, unspecified: Secondary | ICD-10-CM

## 2022-09-17 DIAGNOSIS — K5909 Other constipation: Secondary | ICD-10-CM

## 2022-09-17 MED ORDER — ESOMEPRAZOLE MAGNESIUM 40 MG PO CPDR
40.0000 mg | DELAYED_RELEASE_CAPSULE | Freq: Every day | ORAL | 3 refills | Status: DC
Start: 2022-09-17 — End: 2023-12-01
  Filled 2022-09-17: qty 30, 30d supply, fill #0
  Filled 2022-10-03 – 2022-10-11 (×3): qty 30, 30d supply, fill #1
  Filled 2022-11-05: qty 30, 30d supply, fill #2
  Filled 2023-01-08: qty 30, 30d supply, fill #3

## 2022-09-17 NOTE — Progress Notes (Signed)
Primary GI: Jennifer Papa, MD  / Will ask Dr. Lavon Sharp to assume care     ASSESSMENT & PLAN   Brief Narrative:  62 y.o.  female whose past medical history includes,  but is not necessarily limited to, GERD chronic constipation, multiple cavernoma syndrome, colon polyps   GERD without Barrett's esophagus having recurrent hoarse voice, sore throat , occasional dysphagia  ( chicken) on famotidine 20 mg daily.  Symptoms improving following transition from famotidine to nexium 20 mg daily in early June.  Apparently has a remote history of a Schatzki's ring/prior esophageal dilation though I am unable to locate reports in epic.  More recent upper endoscopies in 2016 and 2021 without any esophageal findings -Antireflux measures discussed -Increase Nexium to 40 mg before breakfast. Contact us with a condition update in 3 weeks . If not continuing to improve will consider EGD   Chronic constipation characterized by decreased frequency / urge -She will adjust Miralax dose and try 1/2 capful in 8 oz water every other day.   Intermittent generalized upper abdominal pain.  Generally occurs around 4 AM and is relieved with Tums.  Improving after change from famotidine to Nexium. Pain does sound peptic in nature.  -If she does not continue to improve on Nexium then consider EGD. Weight is stable.   Strong [facility] of colon cancer in Father, 2 paternal uncles and paternal grandmother  Personal history colon polyps. Two < 10 mm adenomas removed in March 2021 -Surveillance colonoscopy due March 2026  Multiple cavernoma syndrome. Incidental finding on cervical spine MRI done for numbness of hand in 2019. Followed by Neurology.    HPI   Brief GI history Keeps feeling [name] has been followed here for years for GERD and a history of colon polyps.  We have not seen her in 3 years because she had been doing well on famotidine until just recently  Interval History    Chief complaint : Hoarse/weak voice, sore  throat, occasional problems with chicken sticking in her esophagus, constipation  Jennifer Sharp had been doing well on pepcid 20 mg daily for about 3 years. Then,  around March 2024 she began noticing an intermittent sore throat and her voice doesn't feel as strong. Additionally she has had a few episodes of dysphagia  when eating chicken.   It took a while to get an appointment with [place] so in the interim she spoke with a pharmacist who recommended Nexium.  In early June she discontinued famotidine and started Nexium 20 mg daily before breakfast.  Symptoms have improved.   Jennifer Sharp does mention that sometimes around  4 am she has woken up with generalized,  non-radiating upper abdominal pain She cannot relate the episodes to anything she ate before bedtime. This started several months ago and she has about 2-3 episodes a month .  Pain subsides with Tums and episodes have decreased in frequency since starting Nexium . No associated N/V. Weight is stable.   Jennifer Sharp also has ongoing problems with constipation. Takes Miralax if goes without a BM for 3 days.  After dose of MiraLAX she will usually have a bowel movement in 1 to 2 days . She eats a lot of veggies and drinks a lot of water.  She would like to try and regulate her bowel movements better.     Previous GI Endoscopies / Labs / Imaging   **May not include all endoscopic evaluations   EGD 2016 -Normal-appearing esophagus and GE junction.  The stomach was well-visualized  and normal in appearance.  Normal-appearing duodenum  March 2021 colonoscopy  - Two 6 to 8 mm polyps in the ascending colon, removed with a cold snare. Resected and retrieved. - Internal hemorrhoids. - The examination was otherwise normal on direct and retroflexion views. Surgical [P], colon, ascending, polyp (2) - TUBULAR ADENOMA WITHOUT HIGH GRADE DYSPLASIA (X MULTIPLE).  March 2021 EGD for dyspepsia and dysphagia -Mild inflammation characterized by erythema and granularity found in  the gastric antrum.  Biopsies taken.  Exam otherwise normal Path: GASTRIC ANTRAL AND OXYNTIC MUCOSA WITH MILD CHRONIC GASTRITIS. - WARTHIN-STARRY STAIN IS NEGATIVE FOR HELICOBACTER PYLORI.     Latest Ref Rng & Units 12/07/2021    1:31 PM 12/06/2019    9:44 AM 12/01/2018    9:32 AM  Hepatic Function  Total Protein 6.0 - 8.3 g/dL 6.5  6.9  6.6   Albumin 3.5 - 5.2 g/dL 4.5  4.5  4.5   AST 0 - 37 U/L 13  19  13    ALT 0 - 35 U/L 11  22  15    Alk Phosphatase 39 - 117 U/L 51  60  62   Total Bilirubin 0.2 - 1.2 mg/dL 0.6  0.8  0.7        Latest Ref Rng & Units 12/07/2021    1:31 PM 12/06/2019    9:44 AM 12/01/2018    9:32 AM  CBC  WBC 4.0 - 10.5 K/uL 6.7  7.1  6.5   Hemoglobin 12.0 - 15.0 g/dL 16.1  09.6  04.5   Hematocrit 36.0 - 46.0 % 43.5  43.0  44.3   Platelets 150.0 - 400.0 K/uL 235.0  211.0  247.0      Past Medical History:  Diagnosis Date   Ankle swelling    occasional    Basal cell carcinoma of nose 02/09/2020   Cavernoma 05/2017   Cerebral cavernoma    Colon polyps 07/25/2010   Complication of anesthesia    urinary retention   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   Cyst of vagina 02/09/2020   DUB (dysfunctional uterine bleeding) 2016/2017   GYN did endometrial bx (BENIGN PATH) + pap 04/11/15; u/s to follow   Edema    Endometrial polyp    GERD (gastroesophageal reflux disease) 07/25/2010   with schatzki's ring on endo 07/2010   History of recurrent UTIs 07/25/2010   Melanoma (HCC) 2006   on right arm   Miscarriage    OSA on CPAP 10/02/2011   Osteoporosis    Premenstrual migraine    headaches since bleeding started   Schatzki's ring    Seasonal allergic rhinitis    SI (sacroiliac) joint inflammation (HCC)    Slow transit constipation 07/19/2012   Tachycardia    H/O transient  tach. as a chlid    Umbilical hernia 07/25/2010   Unspecified constipation 07/19/2012    Past Surgical History:   Procedure Laterality Date   COLONOSCOPY  02/2010   5 yr recall (FH colon cancer)   DILATATION & CURRETTAGE/HYSTEROSCOPY WITH RESECTOCOPE N/A 04/21/2015   Path: benign. Procedure: DILATATION & CURETTAGE/HYSTEROSCOPY WITH RESECTOCOPE;  Surgeon: Hal Morales, MD;  Location: [facility];  Service: Gynecology;  Laterality: N/A;   DILATION AND CURETTAGE OF UTERUS  [phone]   DILATION AND EVACUATION N/A 04/21/2015   Done for DUB/Menorrhagia.  Procedure: DILATATION AND EVACUATION;  Surgeon: Hal Morales, MD;  Location: [facility];  Service: Gynecology;  Laterality:  N/A;   EXPLORATORY LAPAROTOMY     HERNIA REPAIR     left inguinal   MELANOMA EXCISION  2006   R arm   oral surgeries  70's and 80's   TONSILLECTOMY     childhood   VENTRAL HERNIA REPAIR N/A 10/29/2012   Procedure: LAPAROSCOPIC VENTRAL HERNIA WITH MESH;  Surgeon: Kandis Cocking, MD;  Location: [facility];  Service: General;  Laterality: N/A;    Family History  Problem Relation Age of Onset   Hyperlipidemia Mother    Hypertension Mother    Stroke Mother    Cancer Father 20       ish, colon- remission/ skin cancer   Colon cancer Father    Other Brother        stent placed//AVM   Hypertension Brother    Hyperlipidemia Brother    Heart disease Brother        2 cardiac stents   Allergy (severe) Brother        smoker   Other Brother        respiratory problems   Diabetes Maternal Grandmother    Stroke Maternal Grandmother    Hypertension Maternal Grandmother    Alzheimer's disease Maternal Grandmother    Diabetes Maternal Grandfather    Other Maternal Grandfather        cardiac disease   Colon cancer Paternal Grandmother    Other Daughter        heart ablation/ heart arrythmia   ADD / ADHD Daughter        ADD   Heart disease Daughter 15       tachyarrythmia, scheduled for ablation    ADD / ADHD Son        ADD   Colon cancer Maternal Uncle    Cancer Paternal Uncle        colon   Esophageal cancer Neg Hx    Rectal  cancer Neg Hx    Stomach cancer Neg Hx     Current Medications, Allergies, Family History and Social History were reviewed in [employer] electronic medical record.     Current Outpatient Medications  Medication Sig Dispense Refill   Calcium Carb-Cholecalciferol (CALCIUM 600-D PO) Take by mouth 2 (two) times daily.     ciprofloxacin (CIPRO) 500 MG tablet 1 tab BID for 3 days at onset of symptoms, rpt if needed 12 tablet 0   esomeprazole (NEXIUM 24HR) 20 MG capsule Take 20 mg by mouth daily at 12 noon.     vitamin B-12 (CYANOCOBALAMIN) 250 MCG tablet Take 250 mcg by mouth daily.     No current facility-administered medications for this visit.    Review of Systems: No chest pain. No shortness of breath. No urinary complaints.    Physical Exam  Wt Readings from Last 3 Encounters:  09/17/22 162 lb (73.5 kg)  12/07/21 162 lb (73.5 kg)  12/06/19 154 lb (69.9 kg)    BP 104/62 (BP Location: Left Arm, Patient Position: Sitting, Cuff Size: Normal)   Pulse 88   Ht 5' 5.25" (1.657 m) Comment: height measured without shoes  Wt 162 lb (73.5 kg)   BMI 26.75 kg/m  Constitutional:  Pleasant, generally well appearing female in no acute distress. Psychiatric: Normal mood and affect. Behavior is normal. EENT: Pupils normal.  Conjunctivae are normal. No scleral icterus. Neck supple.  Cardiovascular: Normal rate, regular rhythm.  Pulmonary/chest: Effort normal and breath sounds normal. No wheezing, rales or rhonchi. Abdominal: Soft, nondistended, nontender. Bowel sounds active  throughout. There are no masses palpable. No hepatomegaly. Neurological: Alert and oriented to person place and time.  Skin: Skin is warm and dry. No rashes noted.  Jennifer Cluster, Jennifer Sharp  09/17/2022, 2:24 PM

## 2022-09-17 NOTE — Patient Instructions (Signed)
We have sent the following medications to your pharmacy for you to pick up at your convenience:  Nexium 40mg   Take 1/2 capful of Miralax every other day.  Call or send a message in 3 weeks with an update   _______________________________________________________  If your blood pressure at your visit was 140/90 or greater, please contact your primary care physician to follow up on this.  _______________________________________________________  If you are age 25 or older, your body mass index should be between 23-30. Your Body mass index is 26.75 kg/m. If this is out of the aforementioned range listed, please consider follow up with your Primary Care Provider.  If you are age 11 or younger, your body mass index should be between 19-25. Your Body mass index is 26.75 kg/m. If this is out of the aformentioned range listed, please consider follow up with your Primary Care Provider.   ________________________________________________________  The [place] [facility] providers would like to encourage you to use [facility] to communicate with providers for non-urgent requests or questions.  Due to long hold times on the telephone, sending your provider a message by [facility] may be a faster and more efficient way to get a response.  Please allow 48 business hours for a response.  Please remember that this is for non-urgent requests.  _______________________________________________________

## 2022-10-03 ENCOUNTER — Other Ambulatory Visit (HOSPITAL_COMMUNITY): Payer: Self-pay

## 2022-10-08 ENCOUNTER — Other Ambulatory Visit (HOSPITAL_COMMUNITY): Payer: Self-pay

## 2022-10-08 ENCOUNTER — Other Ambulatory Visit: Payer: Self-pay

## 2022-10-11 ENCOUNTER — Other Ambulatory Visit: Payer: Self-pay

## 2022-10-14 DIAGNOSIS — G4733 Obstructive sleep apnea (adult) (pediatric): Secondary | ICD-10-CM | POA: Diagnosis not present

## 2022-11-04 ENCOUNTER — Other Ambulatory Visit (HOSPITAL_BASED_OUTPATIENT_CLINIC_OR_DEPARTMENT_OTHER): Payer: Self-pay

## 2022-11-04 MED ORDER — COVID-19 MRNA VAC-TRIS(PFIZER) 30 MCG/0.3ML IM SUSY
0.3000 mL | PREFILLED_SYRINGE | Freq: Once | INTRAMUSCULAR | 0 refills | Status: AC
  Filled 2022-11-04: qty 0.3, 1d supply, fill #0

## 2022-11-05 ENCOUNTER — Other Ambulatory Visit (HOSPITAL_COMMUNITY): Payer: Self-pay

## 2022-11-07 ENCOUNTER — Encounter: Payer: 59 | Admitting: Family Medicine

## 2022-11-14 DIAGNOSIS — G4733 Obstructive sleep apnea (adult) (pediatric): Secondary | ICD-10-CM | POA: Diagnosis not present

## 2022-11-25 DIAGNOSIS — Z01411 Encounter for gynecological examination (general) (routine) with abnormal findings: Secondary | ICD-10-CM | POA: Diagnosis not present

## 2022-11-25 DIAGNOSIS — R109 Unspecified abdominal pain: Secondary | ICD-10-CM | POA: Diagnosis not present

## 2022-11-25 DIAGNOSIS — Z124 Encounter for screening for malignant neoplasm of cervix: Secondary | ICD-10-CM | POA: Diagnosis not present

## 2022-11-25 DIAGNOSIS — R102 Pelvic and perineal pain: Secondary | ICD-10-CM | POA: Diagnosis not present

## 2022-11-25 DIAGNOSIS — Z1231 Encounter for screening mammogram for malignant neoplasm of breast: Secondary | ICD-10-CM | POA: Diagnosis not present

## 2022-12-03 ENCOUNTER — Telehealth: Payer: Self-pay | Admitting: *Deleted

## 2022-12-03 NOTE — Telephone Encounter (Signed)
Meredith Pel, NP  Avanell Shackleton, RN Alona Bene I called patient. Can you please put this as a phone note. Thanks    Spoke with [place] over the phone. She is doing pretty well on Nexium 40 mg daily. Plan is to de-escalate PPI.  Recommend --Continue anti-reflux measures --Decrease Nexium to 20 mg daily, 30 minutes before breakfast for 2 weeks. If still doing well then change to Famotidine 20 mg in the morning ( with or without food). If recurrent GERD symptoms then consider increasing pepcid to BID ( vrs just resuming the lowest effective dose of Nexium). Otherwise, if continues to do well after a couple of weeks then can try to take the pepcid only as needed.       Previous Messages  Update Doctor, hospital First) View All Conversations on this Encounter [place], Wilhemina Cash, NP  You2 hours ago (2:05 PM)    Alona Bene I called patient. Can you please put this as a phone note. Thanks    Spoke with [place] over the phone. She is doing pretty well on Nexium 40 mg daily. Plan is to de-escalate PPI.  Recommend --Continue anti-reflux measures --Decrease Nexium to 20 mg daily, 30 minutes before breakfast for 2 weeks. If still doing well then change to Famotidine 20 mg in the morning ( with or without food). If recurrent GERD symptoms then consider increasing pepcid to BID ( vrs just resuming the lowest effective dose of Nexium). Otherwise, if continues to do well after a couple of weeks then can try to take the pepcid only as needed.   You  Willette Cluster M, NP5 hours ago (11:01 AM)   JB See below   [place] Candy Sledge, RN routed conversation to [place] (4:18 PM)   Trisha Mangle  P [facility] Clinical Pool (supporting Meredith Pel, NP)Yesterday (11:32 AM)    Aurther Loft, just wanted to send an update. I have been on the Nexum 40mg  since 09/18/22. My reflux symptoms, mainly burning in the back of my throat have greatly improved. I have only taken pepsid or tums on 2 occasions since starting the  Nexum. I have only been woken 1 time with hurting in my abdomen, which was resolved after taking tums.  My constipation has improved. Not eating 3 hrs before bedtime. I have only lost about 5lbs, continuing to work to shed some more! I believe I mentioned, I had some lower abdomen pain, it's in the location of uterus/ovaries/bladder. My gyn doc did not feel any masses or anything suspicious on my visit last week. She is doing an ultrasound next month. She did say I had stage 1 bladder prolapse. This specific lower abd pain has occurred 5 X since 8/13. I can get it to resolve after lying on my side around 30 minutes to an hr. Just wanted you to know about this issue. It doesn't seem to be related to GI.  Please advise next steps with the Nexum. Thanks, Inocencio Homes

## 2022-12-17 ENCOUNTER — Encounter: Payer: 59 | Admitting: Family Medicine

## 2022-12-17 DIAGNOSIS — R102 Pelvic and perineal pain: Secondary | ICD-10-CM | POA: Diagnosis not present

## 2022-12-18 DIAGNOSIS — R102 Pelvic and perineal pain: Secondary | ICD-10-CM | POA: Diagnosis not present

## 2022-12-18 DIAGNOSIS — N8111 Cystocele, midline: Secondary | ICD-10-CM | POA: Diagnosis not present

## 2022-12-24 ENCOUNTER — Ambulatory Visit (INDEPENDENT_AMBULATORY_CARE_PROVIDER_SITE_OTHER): Payer: 59 | Admitting: Family Medicine

## 2022-12-24 ENCOUNTER — Encounter: Payer: Self-pay | Admitting: Family Medicine

## 2022-12-24 VITALS — BP 128/80 | HR 74 | Temp 97.4°F | Ht 66.73 in | Wt 156.6 lb

## 2022-12-24 DIAGNOSIS — Z0000 Encounter for general adult medical examination without abnormal findings: Secondary | ICD-10-CM | POA: Diagnosis not present

## 2022-12-24 DIAGNOSIS — Z23 Encounter for immunization: Secondary | ICD-10-CM

## 2022-12-24 DIAGNOSIS — E7849 Other hyperlipidemia: Secondary | ICD-10-CM

## 2022-12-24 DIAGNOSIS — E538 Deficiency of other specified B group vitamins: Secondary | ICD-10-CM | POA: Diagnosis not present

## 2022-12-24 DIAGNOSIS — D1800 Hemangioma unspecified site: Secondary | ICD-10-CM | POA: Insufficient documentation

## 2022-12-24 DIAGNOSIS — R7303 Prediabetes: Secondary | ICD-10-CM

## 2022-12-24 DIAGNOSIS — M85859 Other specified disorders of bone density and structure, unspecified thigh: Secondary | ICD-10-CM | POA: Diagnosis not present

## 2022-12-24 LAB — CBC WITH DIFFERENTIAL/PLATELET
Basophils Absolute: 0 10*3/uL (ref 0.0–0.1)
Basophils Relative: 0.7 % (ref 0.0–3.0)
Eosinophils Absolute: 0.1 10*3/uL (ref 0.0–0.7)
Eosinophils Relative: 1.9 % (ref 0.0–5.0)
HCT: 46.4 % — ABNORMAL HIGH (ref 36.0–46.0)
Hemoglobin: 14.9 g/dL (ref 12.0–15.0)
Lymphocytes Relative: 34.2 % (ref 12.0–46.0)
Lymphs Abs: 2.3 10*3/uL (ref 0.7–4.0)
MCHC: 32.1 g/dL (ref 30.0–36.0)
MCV: 94.3 fL (ref 78.0–100.0)
Monocytes Absolute: 0.4 10*3/uL (ref 0.1–1.0)
Monocytes Relative: 6.7 % (ref 3.0–12.0)
Neutro Abs: 3.7 10*3/uL (ref 1.4–7.7)
Neutrophils Relative %: 56.5 % (ref 43.0–77.0)
Platelets: 234 10*3/uL (ref 150.0–400.0)
RBC: 4.93 Mil/uL (ref 3.87–5.11)
RDW: 13.8 % (ref 11.5–15.5)
WBC: 6.6 10*3/uL (ref 4.0–10.5)

## 2022-12-24 LAB — COMPREHENSIVE METABOLIC PANEL
ALT: 17 U/L (ref 0–35)
AST: 15 U/L (ref 0–37)
Albumin: 4.6 g/dL (ref 3.5–5.2)
Alkaline Phosphatase: 58 U/L (ref 39–117)
BUN: 14 mg/dL (ref 6–23)
CO2: 33 meq/L — ABNORMAL HIGH (ref 19–32)
Calcium: 10 mg/dL (ref 8.4–10.5)
Chloride: 102 meq/L (ref 96–112)
Creatinine, Ser: 0.79 mg/dL (ref 0.40–1.20)
GFR: 80.67 mL/min (ref >60.00)
Glucose, Bld: 102 mg/dL — ABNORMAL HIGH (ref 70–99)
Potassium: 4.1 meq/L (ref 3.5–5.1)
Sodium: 142 meq/L (ref 135–145)
Total Bilirubin: 0.6 mg/dL (ref 0.2–1.2)
Total Protein: 6.8 g/dL (ref 6.0–8.3)

## 2022-12-24 LAB — LIPID PANEL
Cholesterol: 157 mg/dL (ref 0–200)
HDL: 54.9 mg/dL (ref >39.00)
LDL Cholesterol: 85 mg/dL (ref 0–99)
NonHDL: 102.32
Total CHOL/HDL Ratio: 3
Triglycerides: 87 mg/dL (ref 0.0–149.0)
VLDL: 17.4 mg/dL (ref 0.0–40.0)

## 2022-12-24 LAB — TSH: TSH: 1.63 u[IU]/mL (ref 0.35–5.50)

## 2022-12-24 LAB — HEMOGLOBIN A1C: Hgb A1c MFr Bld: 6 % (ref 4.6–6.5)

## 2022-12-24 LAB — VITAMIN B12: Vitamin B-12: 1531 pg/mL — ABNORMAL HIGH (ref 211–911)

## 2022-12-24 NOTE — Progress Notes (Signed)
Patient ID: Jennifer Sharp, female  DOB: 1960-12-07, 62 y.o.   MRN: [id number] Patient Care Team    Relationship Specialty Notifications Start End  Natalia Leatherwood, DO PCP - General Family Medicine  09/13/15   Rachael Fee, MD Attending Physician Gastroenterology  09/13/15   [place], Heather, OD  Optometry  09/13/15   Maeola Harman, MD Consulting Physician Vascular Surgery  05/12/17   Janeece Fitting, MD Referring Physician Dermatology  12/01/18   Zebedee Iba, PT Physical Therapist Physical Therapy  10/31/20   Hoover Browns, MD Consulting Physician Obstetrics and Gynecology  12/07/21     Chief Complaint  Patient presents with   Annual Exam    Pt is fasting    Subjective:  Jennifer Sharp is a 62 y.o.  Female  present for CPE  All past medical history, surgical history, allergies, family history, immunizations, medications and social history were updated in the electronic medical record today. All recent labs, ED visits and hospitalizations within the last year were reviewed.  Health maintenance:  Colon cancer screen: completed 2021, by Dr. Christella Hartigan, resutls normal. follow up 5 yr (Fhx). Personal history of polyps  Mammogram: completed: records requested At gyn office, reported normal.requested-11/28/2022 Cervical cancer screening: completed by: Gyn- requested-11/25/2022 Immunizations: tdap 08/2015 utd, Influenza UTD receives through work 2024. (encouraged yearly).  Shingrix completed 2020.   Infectious disease screening: hiv and hep c completed DEXA: 2024--> -1.7, Fhx osteoporosis, estrogen deficient. Long term use of nexium. Vit D normal.> -rpt 2-3 yrs Patient has a Dental home. Hospitalizations/ED visits: reviewed      12/24/2022   10:45 AM 05/27/2022    8:59 AM 12/07/2021    1:29 PM 12/06/2019    8:32 AM 12/01/2018    9:05 AM  Depression screen PHQ 2/9  Decreased Interest 0 0 0 0 0  Down, Depressed, Hopeless 0 0 0 0 1  PHQ - 2 Score 0 0 0 0 1  Altered  sleeping 2  2    Tired, decreased energy 1  1    Change in appetite 1  1    Feeling bad or failure about yourself  0  0    Trouble concentrating 0  0    Moving slowly or fidgety/restless 0  0    Suicidal thoughts 0  0    PHQ-9 Score 4  4    Difficult doing work/chores Not difficult at all          12/07/2021    1:30 PM  GAD 7 : Generalized Anxiety Score  Nervous, Anxious, on Edge 1  Control/stop worrying 1  Worry too much - different things 1  Trouble relaxing 0  Restless 0  Easily annoyed or irritable 0  Afraid - awful might happen 0  Total GAD 7 Score 3    Immunization History  Administered Date(s) Administered   Influenza Split 10/28/2016   Influenza Whole 11/19/2010   Influenza,inj,Quad PF,6+ Mos 10/24/2021, 12/07/2021   Influenza,inj,quad, With Preservative 11/19/2014   Influenza-Unspecified 11/19/2014, 11/08/2016, 11/05/2017, 12/02/2020, 11/26/2022   PFIZER Comirnaty(Gray Top)Covid-19 Tri-Sucrose Vaccine 06/29/2020   PFIZER(Purple Top)SARS-COV-2 Vaccination 02/08/2019, 02/26/2019, 11/19/2019   Pfizer Covid-19 Vaccine Bivalent Booster 59yrs & up 11/15/2020   Pfizer(Comirnaty)Fall Seasonal Vaccine 12 years and older 12/07/2021, 11/04/2022   Tdap 09/13/2015   Zoster Recombinant(Shingrix) 12/16/2017, 04/20/2018     Past Medical History:  Diagnosis Date   Ankle swelling    occasional    Basal cell carcinoma of nose  02/09/2020   Cavernoma 05/2017   Cerebral cavernoma    Colon polyps 07/25/2010   Complication of anesthesia    urinary retention   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   Cyst of vagina 02/09/2020   DUB (dysfunctional uterine bleeding) 2016/2017   GYN did endometrial bx (BENIGN PATH) + pap 04/11/15; u/s to follow   Edema    Endometrial polyp    GERD (gastroesophageal reflux disease) 07/25/2010   with schatzki's ring on endo 07/2010   History of recurrent UTIs 07/25/2010   Melanoma (HCC) 2006    on right arm   Miscarriage    OSA on CPAP 10/02/2011   Osteoporosis    Premenstrual migraine    headaches since bleeding started   Schatzki's ring    Seasonal allergic rhinitis    SI (sacroiliac) joint inflammation (HCC)    Slow transit constipation 07/19/2012   Tachycardia    H/O transient  tach. as a chlid    Umbilical hernia 07/25/2010   Unspecified constipation 07/19/2012   Allergies  Allergen Reactions   Anticoagulant Compound    Asa [Aspirin]    Nsaids    Other Other (See Comments)    Anticoagulants - Risk of bleeding due to multiple cavernoma syndrome   Past Surgical History:  Procedure Laterality Date   COLONOSCOPY  02/2010   5 yr recall (FH colon cancer)   DILATATION & CURRETTAGE/HYSTEROSCOPY WITH RESECTOCOPE N/A 04/21/2015   Path: benign. Procedure: DILATATION & CURETTAGE/HYSTEROSCOPY WITH RESECTOCOPE;  Surgeon: Hal Morales, MD;  Location: [facility];  Service: Gynecology;  Laterality: N/A;   DILATION AND CURETTAGE OF UTERUS  [phone]   DILATION AND EVACUATION N/A 04/21/2015   Done for DUB/Menorrhagia.  Procedure: DILATATION AND EVACUATION;  Surgeon: Hal Morales, MD;  Location: [facility];  Service: Gynecology;  Laterality: N/A;   EXPLORATORY LAPAROTOMY     HERNIA REPAIR     left inguinal   MELANOMA EXCISION  2006   R arm   oral surgeries  70's and 80's   TONSILLECTOMY     childhood   VENTRAL HERNIA REPAIR N/A 10/29/2012   Procedure: LAPAROSCOPIC VENTRAL HERNIA WITH MESH;  Surgeon: Kandis Cocking, MD;  Location: [facility];  Service: General;  Laterality: N/A;   Family History  Problem Relation Age of Onset   Hyperlipidemia Mother    Hypertension Mother    Stroke Mother    Cancer Father 69       ish, colon- remission/ skin cancer   Colon cancer Father    Other Brother        stent placed//AVM   Hypertension Brother    Hyperlipidemia Brother    Heart disease Brother        2 cardiac stents   Allergy (severe) Brother        smoker   Other Brother         respiratory problems   Diabetes Maternal Grandmother    Stroke Maternal Grandmother    Hypertension Maternal Grandmother    Alzheimer's disease Maternal Grandmother    Diabetes Maternal Grandfather    Other Maternal Grandfather        cardiac disease   Colon cancer Paternal Grandmother    Other Daughter        heart ablation/ heart arrythmia   ADD / ADHD Daughter        ADD   Heart disease Daughter 80  tachyarrythmia, scheduled for ablation    ADD / ADHD Son        ADD   Colon cancer Maternal Uncle    Cancer Paternal Uncle        colon   Esophageal cancer Neg Hx    Rectal cancer Neg Hx    Stomach cancer Neg Hx    Social History   Social History Narrative   Married. Right handed.   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    Wears seatbelt. Smoke detector in the home.    Caffeine use: 1 cup coffee per day.     Allergies as of 12/24/2022       Reactions   Anticoagulant Compound    Asa [aspirin]    Nsaids    Other Other (See Comments)   Anticoagulants - Risk of bleeding due to multiple cavernoma syndrome        Medication List        Accurate as of December 24, 2022 10:59 AM. If you have any questions, ask your nurse or doctor.          STOP taking these medications    ciprofloxacin 500 MG tablet Commonly known as: Cipro Stopped by: Felix Pacini       TAKE these medications    CALCIUM 600-D PO Take by mouth 2 (two) times daily.   cyanocobalamin 1000 MCG tablet Commonly known as: VITAMIN B12 Take 2,500 mcg by mouth daily. What changed: Another medication with the same name was removed. Continue taking this medication, and follow the directions you see here. Changed by: Felix Pacini   esomeprazole 40 MG capsule Commonly known as: NexIUM Take 1 capsule (40 mg total) by mouth daily at 12 noon.        All past medical history, surgical history, allergies, family history, immunizations andmedications were updated in the EMR today and reviewed under  the history and medication portions of their EMR.     No results found for this or any previous visit (from the past 2160 hour(s)).   ROS 14 pt review of systems performed and negative (unless mentioned in an HPI)  Objective: BP 128/80   Pulse 74   Temp (!) 97.4 F (36.3 C)   Ht 5' 6.73" (1.695 m)   Wt 156 lb 9.6 oz (71 kg)   SpO2 97%   BMI 24.72 kg/m  Physical Exam Vitals and nursing note reviewed.  Constitutional:      General: She is not in acute distress.    Appearance: Normal appearance. She is not ill-appearing or toxic-appearing.  HENT:     Head: Normocephalic and atraumatic.     Right Ear: Tympanic membrane, ear canal and external ear normal. There is no impacted cerumen.     Left Ear: Tympanic membrane, ear canal and external ear normal. There is no impacted cerumen.     Nose: No congestion or rhinorrhea.     Mouth/Throat:     Mouth: Mucous membranes are moist.     Pharynx: Oropharynx is clear. No oropharyngeal exudate or posterior oropharyngeal erythema.  Eyes:     General: No scleral icterus.       Right eye: No discharge.        Left eye: No discharge.     Extraocular Movements: Extraocular movements intact.     Conjunctiva/sclera: Conjunctivae normal.     Pupils: Pupils are equal, round, and reactive to light.  Cardiovascular:     Rate and Rhythm: Normal rate and regular  rhythm.     Pulses: Normal pulses.     Heart sounds: Normal heart sounds. No murmur heard.    No friction rub. No gallop.  Pulmonary:     Effort: Pulmonary effort is normal. No respiratory distress.     Breath sounds: Normal breath sounds. No stridor. No wheezing, rhonchi or rales.  Chest:     Chest wall: No tenderness.  Abdominal:     General: Abdomen is flat. Bowel sounds are normal. There is no distension.     Palpations: Abdomen is soft. There is no mass.     Tenderness: There is no abdominal tenderness. There is no right CVA tenderness, left CVA tenderness, guarding or rebound.      Hernia: No hernia is present.  Musculoskeletal:        General: No swelling, tenderness or deformity. Normal range of motion.     Cervical back: Normal range of motion and neck supple. No rigidity or tenderness.     Right lower leg: No edema.     Left lower leg: No edema.  Lymphadenopathy:     Cervical: No cervical adenopathy.  Skin:    General: Skin is warm and dry.     Coloration: Skin is not jaundiced or pale.     Findings: No bruising, erythema, lesion or rash.  Neurological:     General: No focal deficit present.     Mental Status: She is alert and oriented to person, place, and time. Mental status is at baseline.     Cranial Nerves: No cranial nerve deficit.     Sensory: No sensory deficit.     Motor: No weakness.     Coordination: Coordination normal.     Gait: Gait normal.     Deep Tendon Reflexes: Reflexes normal.  Psychiatric:        Mood and Affect: Mood normal.        Behavior: Behavior normal.        Thought Content: Thought content normal.        Judgment: Judgment normal.     No results found.  Assessment/plan: Jennifer Sharp is a 62 y.o. female present for CPE  hyperlipidemia Lipid panel collected B12 deficiency - Vitamin B12 collected today Continue sublingual 2500 mcg daily. If she is adequately supplemented with the sublingual only, she will not need injections.  Malignant melanoma, unspecified site [facility]) Routine follow-ups with derm  Osteopenia of neck of femur, unspecified laterality Managed by GYN Tscore- osteopenia -1.7 2024  Routine general medical examination at a health care facility Patient was encouraged to exercise greater than 150 minutes a week. Patient was encouraged to choose a diet filled with fresh fruits and vegetables, and lean meats. - CBC with Differential/Platelet - Comprehensive metabolic panel - TSH AVS provided to patient today for education/recommendation on gender specific health and safety maintenance. Colon cancer  screen: completed 2021, by Dr. Christella Hartigan, resutls normal. follow up 5 yr (Fhx). Personal history of polyps  Mammogram: completed: records requested At gyn office, reported normal.requested-11/28/2022 Cervical cancer screening: completed by: Gyn- requested-11/25/2022 Immunizations: tdap 08/2015 utd, Influenza UTD receives through work 2024. (encouraged yearly).  Shingrix completed 2020.   Infectious disease screening: hiv and hep c completed DEXA: 2024--> -1.7, Fhx osteoporosis, estrogen deficient. Long term use of nexium. Vit D normal.> -rpt 2-3 yrs  Return in about 1 year (around 12/25/2023) for cpe (20 min).  Orders Placed This Encounter  Procedures   CBC with Differential/Platelet   Hemoglobin A1c  Comprehensive metabolic panel   Lipid panel   TSH   No orders of the defined types were placed in this encounter.  Referral Orders  No referral(s) requested today     Electronically signed by: Felix Pacini, DO [facility] [place]

## 2022-12-24 NOTE — Patient Instructions (Signed)

## 2023-01-08 ENCOUNTER — Other Ambulatory Visit: Payer: Self-pay

## 2023-01-10 DIAGNOSIS — D225 Melanocytic nevi of trunk: Secondary | ICD-10-CM | POA: Diagnosis not present

## 2023-01-10 DIAGNOSIS — D2270 Melanocytic nevi of unspecified lower limb, including hip: Secondary | ICD-10-CM | POA: Diagnosis not present

## 2023-01-10 DIAGNOSIS — D2260 Melanocytic nevi of unspecified upper limb, including shoulder: Secondary | ICD-10-CM | POA: Diagnosis not present

## 2023-01-10 DIAGNOSIS — D1801 Hemangioma of skin and subcutaneous tissue: Secondary | ICD-10-CM | POA: Diagnosis not present

## 2023-01-10 DIAGNOSIS — Q828 Other specified congenital malformations of skin: Secondary | ICD-10-CM | POA: Diagnosis not present

## 2023-01-10 DIAGNOSIS — L814 Other melanin hyperpigmentation: Secondary | ICD-10-CM | POA: Diagnosis not present

## 2023-01-10 DIAGNOSIS — L570 Actinic keratosis: Secondary | ICD-10-CM | POA: Diagnosis not present

## 2023-01-10 DIAGNOSIS — Z85820 Personal history of malignant melanoma of skin: Secondary | ICD-10-CM | POA: Diagnosis not present

## 2023-01-10 DIAGNOSIS — L821 Other seborrheic keratosis: Secondary | ICD-10-CM | POA: Diagnosis not present

## 2023-01-10 DIAGNOSIS — Z85828 Personal history of other malignant neoplasm of skin: Secondary | ICD-10-CM | POA: Diagnosis not present

## 2023-09-02 ENCOUNTER — Telehealth: Payer: Self-pay

## 2023-09-02 NOTE — Telephone Encounter (Addendum)
 Please return patient's call She had a bone density completed 04/24/2022.  It is too early for her to have another bone density. Bone densities are completed every 2 years and orders are placed during patient's annual CPE's.

## 2023-09-02 NOTE — Telephone Encounter (Signed)
Pt aware.

## 2023-09-02 NOTE — Telephone Encounter (Signed)
 Communication  Reason for CRM: Patient wanting to get a bone density test scheduled. Please reach out to patient.    Please advise if okay for orders? LCPE: 12/24/2022

## 2023-10-21 ENCOUNTER — Encounter: Admitting: Family Medicine

## 2023-11-07 ENCOUNTER — Ambulatory Visit (INDEPENDENT_AMBULATORY_CARE_PROVIDER_SITE_OTHER): Admitting: Family Medicine

## 2023-11-07 ENCOUNTER — Encounter: Payer: Self-pay | Admitting: Family Medicine

## 2023-11-07 VITALS — BP 112/70 | HR 60 | Temp 97.9°F | Ht 65.3 in | Wt 155.0 lb

## 2023-11-07 DIAGNOSIS — Z23 Encounter for immunization: Secondary | ICD-10-CM | POA: Diagnosis not present

## 2023-11-07 DIAGNOSIS — Z79899 Other long term (current) drug therapy: Secondary | ICD-10-CM | POA: Diagnosis not present

## 2023-11-07 DIAGNOSIS — K219 Gastro-esophageal reflux disease without esophagitis: Secondary | ICD-10-CM | POA: Diagnosis not present

## 2023-11-07 DIAGNOSIS — E538 Deficiency of other specified B group vitamins: Secondary | ICD-10-CM

## 2023-11-07 DIAGNOSIS — R7303 Prediabetes: Secondary | ICD-10-CM | POA: Diagnosis not present

## 2023-11-07 DIAGNOSIS — M85859 Other specified disorders of bone density and structure, unspecified thigh: Secondary | ICD-10-CM

## 2023-11-07 DIAGNOSIS — C439 Malignant melanoma of skin, unspecified: Secondary | ICD-10-CM | POA: Diagnosis not present

## 2023-11-07 DIAGNOSIS — Z0000 Encounter for general adult medical examination without abnormal findings: Secondary | ICD-10-CM

## 2023-11-07 DIAGNOSIS — E7849 Other hyperlipidemia: Secondary | ICD-10-CM | POA: Diagnosis not present

## 2023-11-07 DIAGNOSIS — K635 Polyp of colon: Secondary | ICD-10-CM

## 2023-11-07 LAB — COMPREHENSIVE METABOLIC PANEL WITH GFR
ALT: 12 U/L (ref 0–35)
AST: 12 U/L (ref 0–37)
Albumin: 4.6 g/dL (ref 3.5–5.2)
Alkaline Phosphatase: 57 U/L (ref 39–117)
BUN: 17 mg/dL (ref 6–23)
CO2: 32 meq/L (ref 19–32)
Calcium: 10.3 mg/dL (ref 8.4–10.5)
Chloride: 104 meq/L (ref 96–112)
Creatinine, Ser: 0.77 mg/dL (ref 0.40–1.20)
GFR: 82.68 mL/min (ref >60.00)
Glucose, Bld: 99 mg/dL (ref 70–99)
Potassium: 4.3 meq/L (ref 3.5–5.1)
Sodium: 142 meq/L (ref 135–145)
Total Bilirubin: 0.5 mg/dL (ref 0.2–1.2)
Total Protein: 6.6 g/dL (ref 6.0–8.3)

## 2023-11-07 LAB — TSH: TSH: 2 u[IU]/mL (ref 0.35–5.50)

## 2023-11-07 LAB — B12 AND FOLATE PANEL
Folate: 11.2 ng/mL (ref >5.9)
Vitamin B-12: 973 pg/mL — ABNORMAL HIGH (ref 211–911)

## 2023-11-07 LAB — CBC
HCT: 45.4 % (ref 36.0–46.0)
Hemoglobin: 15.2 g/dL — ABNORMAL HIGH (ref 12.0–15.0)
MCHC: 33.5 g/dL (ref 30.0–36.0)
MCV: 93.9 fl (ref 78.0–100.0)
Platelets: 235 K/uL (ref 150.0–400.0)
RBC: 4.83 Mil/uL (ref 3.87–5.11)
RDW: 13.1 % (ref 11.5–15.5)
WBC: 6.8 K/uL (ref 4.0–10.5)

## 2023-11-07 LAB — VITAMIN D 25 HYDROXY (VIT D DEFICIENCY, FRACTURES): VITD: 39.18 ng/mL (ref 30.00–100.00)

## 2023-11-07 LAB — LIPID PANEL
Cholesterol: 160 mg/dL (ref 0–200)
HDL: 55.8 mg/dL (ref >39.00)
LDL Cholesterol: 91 mg/dL (ref 0–99)
NonHDL: 104.31
Total CHOL/HDL Ratio: 3
Triglycerides: 67 mg/dL (ref 0.0–149.0)
VLDL: 13.4 mg/dL (ref 0.0–40.0)

## 2023-11-07 LAB — MAGNESIUM: Magnesium: 2.1 mg/dL (ref 1.5–2.5)

## 2023-11-07 LAB — HEMOGLOBIN A1C: Hgb A1c MFr Bld: 6.4 % (ref 4.6–6.5)

## 2023-11-07 NOTE — Progress Notes (Signed)
 Patient ID: Jennifer Sharp, female  DOB: Feb 22, 1960, 63 y.o.   MRN: [id number] Patient Care Team    Relationship Specialty Notifications Start End  Catherine Charlies LABOR, DO PCP - General Family Medicine  09/13/15   [place], Heather, [place]  Optometry  09/13/15   Pavlis, Rosaline Gillis, MD Referring Physician Dermatology  12/01/18   Zelphia Redbird, PT Physical Therapist Physical Therapy  10/31/20   Gloriann Chick, MD Consulting Physician Obstetrics and Gynecology  12/07/21   Onita Duos, MD Consulting Physician Neurology  01/28/23   Kerman Vina HERO, NP Nurse Practitioner Gastroenterology  11/07/23     Chief Complaint  Patient presents with   Annual Exam    Pt is fasting.  Influenza- declined Prevnar 20- given    Subjective:  Jennifer Sharp is a 63 y.o.  Female  present for CPE  All past medical history, surgical history, allergies, family history, immunizations, medications and social history were updated in the electronic medical record today. All recent labs, ED visits and hospitalizations within the last year were reviewed.  Health maintenance:  Colon cancer screen: completed 2021, by Dr. Teressa, resutls normal. follow up 5 yr (Fhx). Personal history of polyps  Mammogram: completed.  Ordered by gynecology, records requested. Cervical cancer screening: completed by: Gyn-records again requested. Immunizations: tdap 08/2015 utd, Influenza-declined (encouraged yearly).  Shingrix  completed 2020.  Prevnar 20 administered today Infectious disease screening: hiv and hep c completed DEXA: 2024--> -1.7, Fhx osteoporosis, estrogen deficient. Long term use of nexium . Vit D normal.> -rpt 2-3 yrs Patient has a Dental home. Hospitalizations/ED visits: Reviewed      12/24/2022   10:45 AM 05/27/2022    8:59 AM 12/07/2021    1:29 PM 12/06/2019    8:32 AM 12/01/2018    9:05 AM  Depression screen PHQ 2/9  Decreased Interest 0 0 0 0 0  Down, Depressed, Hopeless 0 0 0 0 1  PHQ - 2 Score 0 0 0 0 1   Altered sleeping 2  2    Tired, decreased energy 1  1    Change in appetite 1  1    Feeling bad or failure about yourself  0  0    Trouble concentrating 0  0    Moving slowly or fidgety/restless 0  0    Suicidal thoughts 0  0    PHQ-9 Score 4  4    Difficult doing work/chores Not difficult at all          12/07/2021    1:30 PM  GAD 7 : Generalized Anxiety Score  Nervous, Anxious, on Edge 1  Control/stop worrying 1  Worry too much - different things 1  Trouble relaxing 0  Restless 0  Easily annoyed or irritable 0  Afraid - awful might happen 0  Total GAD 7 Score 3    Immunization History  Administered Date(s) Administered   Influenza Split 10/28/2016   Influenza Whole 11/19/2010   Influenza,inj,Quad PF,6+ Mos 10/24/2021, 12/07/2021   Influenza,inj,quad, With Preservative 11/19/2014   Influenza-Unspecified 11/19/2014, 11/08/2016, 11/05/2017, 12/02/2020, 11/26/2022   PFIZER Comirnaty ETTERGray Top)Covid-19 Tri-Sucrose Vaccine 06/29/2020   PFIZER(Purple Top)SARS-COV-2 Vaccination 02/08/2019, 02/26/2019, 11/19/2019   PNEUMOCOCCAL CONJUGATE-20 11/07/2023   Pfizer Covid-19 Vaccine Bivalent Booster 87yrs & up 11/15/2020   Pfizer(Comirnaty )Fall Seasonal Vaccine 12 years and older 12/07/2021, 11/04/2022   Tdap 09/13/2015   Zoster Recombinant(Shingrix ) 12/16/2017, 04/20/2018     Past Medical History:  Diagnosis Date   Ankle swelling    occasional  Basal cell carcinoma of nose 02/09/2020   Cavernoma 05/2017   Cerebral cavernoma    Colon polyps 07/25/2010   Complication of anesthesia    urinary retention   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   Cyst of vagina 02/09/2020   DUB (dysfunctional uterine bleeding) 2016/2017   GYN did endometrial bx (BENIGN PATH) + pap 04/11/15; u/s to follow   Edema    Endometrial polyp    Estrogen deficiency 05/31/2016   GERD (gastroesophageal reflux disease) 07/25/2010   with  schatzki's ring on endo 07/2010   History of recurrent UTIs 07/25/2010   Melanoma (HCC) 2006   on right arm   Miscarriage    OSA on CPAP 10/02/2011   Osteoarthritis of sternoclavicular joint 05/17/2013   Osteoporosis    Premenstrual migraine    headaches since bleeding started   Schatzki's ring    Seasonal allergic rhinitis    SI (sacroiliac) joint inflammation (HCC)    Slow transit constipation 07/19/2012   Tachycardia    H/O transient  tach. as a chlid    Umbilical hernia 07/25/2010   Unspecified constipation 07/19/2012   Allergies  Allergen Reactions   Anticoagulant Compound    Asa [Aspirin]    Nsaids    Other Other (See Comments)    Anticoagulants - Risk of bleeding due to multiple cavernoma syndrome   Past Surgical History:  Procedure Laterality Date   COLONOSCOPY  02/2010   5 yr recall (FH colon cancer)   DILATATION & CURRETTAGE/HYSTEROSCOPY WITH RESECTOCOPE N/A 04/21/2015   Path: benign. Procedure: DILATATION & CURETTAGE/HYSTEROSCOPY WITH RESECTOCOPE;  Surgeon: Shanda SHAUNNA Muscat, MD;  Location: [facility];  Service: Gynecology;  Laterality: N/A;   DILATION AND CURETTAGE OF UTERUS  [phone]   DILATION AND EVACUATION N/A 04/21/2015   Done for DUB/Menorrhagia.  Procedure: DILATATION AND EVACUATION;  Surgeon: Shanda SHAUNNA Muscat, MD;  Location: [facility];  Service: Gynecology;  Laterality: N/A;   EXPLORATORY LAPAROTOMY     HERNIA REPAIR     left inguinal   MELANOMA EXCISION  2006   R arm   oral surgeries  70's and 80's   TONSILLECTOMY     childhood   VENTRAL HERNIA REPAIR N/A 10/29/2012   Procedure: LAPAROSCOPIC VENTRAL HERNIA WITH MESH;  Surgeon: Alm VEAR Angle, MD;  Location: [facility];  Service: General;  Laterality: N/A;   Family History  Problem Relation Age of Onset   Hyperlipidemia Mother    Hypertension Mother    Stroke Mother    Cancer Father 71       ish, colon- remission/ skin cancer   Colon cancer Father    Other Brother        stent placed//AVM   Hypertension  Brother    Hyperlipidemia Brother    Heart disease Brother        2 cardiac stents   Allergy (severe) Brother        smoker   Other Brother        respiratory problems   Diabetes Maternal Grandmother    Stroke Maternal Grandmother    Hypertension Maternal Grandmother    Alzheimer's disease Maternal Grandmother    Diabetes Maternal Grandfather    Other Maternal Grandfather        cardiac disease   Colon cancer Paternal Grandmother    Other Daughter        heart ablation/ heart arrythmia   ADD / ADHD Daughter  ADD   Heart disease Daughter 22       tachyarrythmia, scheduled for ablation    ADD / ADHD Son        ADD   Colon cancer Maternal Uncle    Cancer Paternal Uncle        colon   Esophageal cancer Neg Hx    Rectal cancer Neg Hx    Stomach cancer Neg Hx    Social History   Social History Narrative   Married. Right handed.   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    Wears seatbelt. Smoke detector in the home.    Caffeine use: 1 cup coffee per day.     Allergies as of 11/07/2023       Reactions   Anticoagulant Compound    Asa [aspirin]    Nsaids    Other Other (See Comments)   Anticoagulants - Risk of bleeding due to multiple cavernoma syndrome        Medication List        Accurate as of November 07, 2023  8:11 AM. If you have any questions, ask your nurse or doctor.          CALCIUM 600-D PO Take by mouth 2 (two) times daily.   cyanocobalamin  1000 MCG tablet Commonly known as: VITAMIN B12 Take 2,500 mcg by mouth daily.   esomeprazole  40 MG capsule Commonly known as: NexIUM  Take 1 capsule (40 mg total) by mouth daily at 12 noon.        All past medical history, surgical history, allergies, family history, immunizations andmedications were updated in the EMR today and reviewed under the history and medication portions of their EMR.     No results found for this or any previous visit (from the past 2160 hours).   ROS 14 pt review of systems  performed and negative (unless mentioned in an HPI)  Objective: BP 112/70   Pulse 60   Temp 97.9 F (36.6 C)   Ht 5' 5.3 (1.659 m)   Wt 155 lb (70.3 kg)   SpO2 95%   BMI 25.56 kg/m  Physical Exam Vitals and nursing note reviewed.  Constitutional:      General: She is not in acute distress.    Appearance: Normal appearance. She is not ill-appearing or toxic-appearing.  HENT:     Head: Normocephalic and atraumatic.     Right Ear: Tympanic membrane, ear canal and external ear normal. There is no impacted cerumen.     Left Ear: Tympanic membrane, ear canal and external ear normal. There is no impacted cerumen.     Nose: No congestion or rhinorrhea.     Mouth/Throat:     Mouth: Mucous membranes are moist.     Pharynx: Oropharynx is clear. No oropharyngeal exudate or posterior oropharyngeal erythema.  Eyes:     General: No scleral icterus.       Right eye: No discharge.        Left eye: No discharge.     Extraocular Movements: Extraocular movements intact.     Conjunctiva/sclera: Conjunctivae normal.     Pupils: Pupils are equal, round, and reactive to light.  Cardiovascular:     Rate and Rhythm: Normal rate and regular rhythm.     Pulses: Normal pulses.     Heart sounds: Normal heart sounds. No murmur heard.    No friction rub. No gallop.  Pulmonary:     Effort: Pulmonary effort is normal. No respiratory distress.  Breath sounds: Normal breath sounds. No stridor. No wheezing, rhonchi or rales.  Chest:     Chest wall: No tenderness.  Abdominal:     General: Abdomen is flat. Bowel sounds are normal. There is no distension.     Palpations: Abdomen is soft. There is no mass.     Tenderness: There is no abdominal tenderness. There is no right CVA tenderness, left CVA tenderness, guarding or rebound.     Hernia: No hernia is present.  Musculoskeletal:        General: No swelling, tenderness or deformity. Normal range of motion.     Cervical back: Normal range of motion and  neck supple. No rigidity or tenderness.     Right lower leg: No edema.     Left lower leg: No edema.  Lymphadenopathy:     Cervical: No cervical adenopathy.  Skin:    General: Skin is warm and dry.     Coloration: Skin is not jaundiced or pale.     Findings: No bruising, erythema, lesion or rash.  Neurological:     General: No focal deficit present.     Mental Status: She is alert and oriented to person, place, and time. Mental status is at baseline.     Cranial Nerves: No cranial nerve deficit.     Sensory: No sensory deficit.     Motor: No weakness.     Coordination: Coordination normal.     Gait: Gait normal.     Deep Tendon Reflexes: Reflexes normal.  Psychiatric:        Mood and Affect: Mood normal.        Behavior: Behavior normal.        Thought Content: Thought content normal.        Judgment: Judgment normal.     No results found.  Assessment/plan: Jennifer Sharp is a 63 y.o. female present for CPE  hyperlipidemia Lipid panel collected today, routine exercise and dietary modifications recommended B12 deficiency - Vitamin B12 c collected today Continue B12 sublingual supplementation  Malignant melanoma, unspecified site [facility]) Continue routine follow-ups with derm  Osteopenia of neck of femur, unspecified laterality Managed by GYN Tscore- osteopenia -1.7 2024  Colon polyps: Repeat colonoscopy due 2026-Dr. Teressa  Routine general medical examination at a health care facility Colon cancer screen: completed 2021, by Dr. Teressa, resutls normal. follow up 5 yr (Fhx). Personal history of polyps  Mammogram: completed.  Ordered by gynecology, records requested. Cervical cancer screening: completed by: Gyn-records again requested. Immunizations: tdap 08/2015 utd, Influenza-declined (encouraged yearly).  Shingrix  completed 2020.  Prevnar 20 administered today Infectious disease screening: hiv and hep c completed DEXA: 2024--> -1.7, Fhx osteoporosis, estrogen deficient.  Long term use of nexium . Vit D normal.> -rpt 2-3 yrs Patient was encouraged to exercise greater than 150 minutes a week. Patient was encouraged to choose a diet filled with fresh fruits and vegetables, and lean meats. AVS provided to patient today for education/recommendation on gender specific health and safety maintenance.   No follow-ups on file.  Orders Placed This Encounter  Procedures   Pneumococcal conjugate vaccine 20-valent   CBC   Comprehensive metabolic panel with GFR   Hemoglobin A1c   Lipid panel   TSH   Vitamin D  (25 hydroxy)   B12 and Folate Panel   Magnesium    No orders of the defined types were placed in this encounter.  Referral Orders  No referral(s) requested today     Electronically signed by: Charlies Bellini,  DO [facility] [place]

## 2023-11-07 NOTE — Patient Instructions (Addendum)
 Return in about 1 year (around 11/07/2024).        Great to see you today.  I have refilled the medication(s) we provide.   If labs were collected or images ordered, we will inform you of  results once we have received them and reviewed. We will contact you either by echart message, or telephone call.  Please give ample time to the testing facility, and our office to run,  receive and review results. Please do not call inquiring of results, even if you can see them in your chart. We will contact you as soon as we are able. If it has been over 1 week since the test was completed, and you have not yet heard from us , then please call us .    - echart message- for normal results that have been seen by the patient already.   - telephone call: abnormal results or if patient has not viewed results in their echart.  If a referral to a specialist was entered for you, please call us  in 2 weeks if you have not heard from the specialist office to schedule.

## 2023-11-10 ENCOUNTER — Ambulatory Visit: Payer: Self-pay | Admitting: Family Medicine

## 2023-11-28 NOTE — Telephone Encounter (Signed)
 Inbound call from patient stating that the appointment that was schedule will not work for her. Patient stated that she was available on the 13 th, 17 th, 22 nd, 23 rd, and 24 th. Patient is requesting a call back. Please advise.

## 2023-11-30 NOTE — Progress Notes (Unsigned)
 Jennifer Sharp [id number] 1960/12/13   Chief Complaint:  Referring Provider: Catherine Charlies LABOR, DO Primary GI MD: Dr. Shila (previous Dr. Teressa)  HPI: Jennifer Sharp is a 63 y.o. female with past medical history of skin cancer, cerebral cavernoma, colon polyps, GERD, recurrent UTIs, OSA on CPAP, osteoporosis, Schatzki ring, slow transit constipation, who presents today for abdominal pain and bloating.    Patient last seen in office 09/17/2022 by Vina Dasen, NP.  Noted to have GERD without Barrett's esophagus, experiencing recurrent hoarse voice, sore throat, and occasional dysphagia to chicken and on famotidine 20 mg daily.  Symptoms had been improving following transition from famotidine to Nexium  20 mg daily a month prior.  Reported history of Schatzki ring/prior esophageal dilation though reports unavailable.  More recent upper endoscopies in 2016 and 2021 without any esophageal findings.  Advised to increase Nexium  to 40 mg and if no improvement consider EGD. Advised to adjust MiraLAX dose and try half capful every other day for chronic constipation.  Was having intermittent generalized upper abdominal pain relieved with Tums and improving after switch to Nexium .  Advised to continue Nexium  and if no improvement consider EGD.  Due for surveillance colonoscopy March 2026 due to personal history of colon polyps and family history of colon cancer in father, 2 paternal uncles, and paternal grandmother.  Patient send update 11/2022 stating reflux symptoms had improved on Nexium  40 mg.  Constipation also improved.  She was advised to decrease Nexium  to 20 mg daily and ultimately tried to change to just famotidine 20 mg in the morning.  If recurrent GERD symptoms consider increasing Pepcid to twice daily or resuming lowest effective dose of Nexium .  Patient sent message 11/27/2023 reporting worsening of symptoms including throat burning, abdominal pain, bloating, belching.  Had been taking  Pepcid 20 mg daily since December 2024.  Advice from Dr. Shila was to schedule office visit to discuss symptoms.  Labs 11/07/2023 with unremarkable CBC, normal CMP, hemoglobin A1c 6.4, normal lipid panel, normal TSH, normal vitamin D , high B12, normal folate, normal magnesium    Previous GI Procedures/Imaging   March 2021 colonoscopy  - Two 6 to 8 mm polyps in the ascending colon, removed with a cold snare. Resected and retrieved. - Internal hemorrhoids. - The examination was otherwise normal on direct and retroflexion views. Surgical [P], colon, ascending, polyp (2) - TUBULAR ADENOMA WITHOUT HIGH GRADE DYSPLASIA (X MULTIPLE).   March 2021 EGD for dyspepsia and dysphagia -Mild inflammation characterized by erythema and granularity found in the gastric antrum.  Biopsies taken.  Exam otherwise normal Path: GASTRIC ANTRAL AND OXYNTIC MUCOSA WITH MILD CHRONIC GASTRITIS. - WARTHIN-STARRY STAIN IS NEGATIVE FOR HELICOBACTER PYLORI.  EGD 2016 -Normal-appearing esophagus and GE junction.  The stomach was well-visualized and normal in appearance.  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   Cyst of vagina 02/09/2020   DUB (dysfunctional uterine bleeding) 2016/2017   GYN did endometrial bx (BENIGN PATH) + pap 04/11/15; u/s to follow   Edema    Endometrial polyp    Estrogen deficiency 05/31/2016   GERD (gastroesophageal reflux disease) 07/25/2010   with schatzki's ring on  endo 07/2010   History of recurrent UTIs 07/25/2010   Melanoma (HCC) 2006   on right arm   Miscarriage    OSA on CPAP 10/02/2011   Osteoarthritis of sternoclavicular joint 05/17/2013   Osteoporosis     Premenstrual migraine    headaches since bleeding started   Schatzki's ring    Seasonal allergic rhinitis    SI (sacroiliac) joint inflammation    Slow transit constipation 07/19/2012   Tachycardia    H/O transient  tach. as a chlid    Umbilical hernia 07/25/2010   Unspecified constipation 07/19/2012    Past Surgical History:  Procedure Laterality Date   COLONOSCOPY  02/2010   5 yr recall (FH colon cancer)   DILATATION & CURRETTAGE/HYSTEROSCOPY WITH RESECTOCOPE N/A 04/21/2015   Path: benign. Procedure: DILATATION & CURETTAGE/HYSTEROSCOPY WITH RESECTOCOPE;  Surgeon: Shanda SHAUNNA Muscat, MD;  Location: [facility];  Service: Gynecology;  Laterality: N/A;   DILATION AND CURETTAGE OF UTERUS  [phone]   DILATION AND EVACUATION N/A 04/21/2015   Done for DUB/Menorrhagia.  Procedure: DILATATION AND EVACUATION;  Surgeon: Shanda SHAUNNA Muscat, MD;  Location: [facility];  Service: Gynecology;  Laterality: N/A;   EXPLORATORY LAPAROTOMY     HERNIA REPAIR     left inguinal   MELANOMA EXCISION  2006   R arm   oral surgeries  70's and 80's   TONSILLECTOMY     childhood   VENTRAL HERNIA REPAIR N/A 10/29/2012   Procedure: LAPAROSCOPIC VENTRAL HERNIA WITH MESH;  Surgeon: Alm VEAR Angle, MD;  Location: [facility];  Service: General;  Laterality: N/A;    Current Outpatient Medications  Medication Sig Dispense Refill   Calcium Carb-Cholecalciferol (CALCIUM 600-D PO) Take by mouth 2 (two) times daily.     cyanocobalamin  (VITAMIN B12) 1000 MCG tablet Take 2,500 mcg by mouth daily.     esomeprazole  (NEXIUM ) 40 MG capsule Take 1 capsule (40 mg total) by mouth daily at 12 noon. 90 capsule 3   No current facility-administered medications for this visit.    Allergies as of 12/01/2023 - Review Complete 11/07/2023  Allergen Reaction Noted   Anticoagulant compound  08/13/2017   Asa [aspirin]  08/13/2017   Nsaids  08/13/2017   Other Other (See Comments) 01/21/2020    Family History  Problem Relation Age of Onset    Hyperlipidemia Mother    Hypertension Mother    Stroke Mother    Cancer Father 65       ish, colon- remission/ skin cancer   Colon cancer Father    Other Brother        stent placed//AVM   Hypertension Brother    Hyperlipidemia Brother    Heart disease Brother        2 cardiac stents   Allergy (severe) Brother        smoker   Other Brother        respiratory problems   Diabetes Maternal Grandmother    Stroke Maternal Grandmother    Hypertension Maternal Grandmother    Alzheimer's disease Maternal Grandmother    Diabetes Maternal Grandfather    Other Maternal Grandfather        cardiac disease   Colon cancer Paternal Grandmother    Other Daughter        heart ablation/ heart arrythmia   ADD / ADHD Daughter        ADD   Heart disease Daughter 15       tachyarrythmia, scheduled for ablation    ADD /  ADHD Son        ADD   Colon cancer Maternal Uncle    Cancer Paternal Uncle        colon   Esophageal cancer Neg Hx    Rectal cancer Neg Hx    Stomach cancer Neg Hx     Social History   Tobacco Use   Smoking status: Never   Smokeless tobacco: Never  Vaping Use   Vaping status: Never Used  Substance Use Topics   Alcohol use: Yes    Alcohol/week: 0.0 standard drinks of alcohol    Comment: socially   Drug use: No     Review of Systems:    Constitutional: No weight loss, fever, chills, weakness or fatigue Eyes: No change in vision Ears, Nose, Throat:  No change in hearing or congestion Skin: No rash or itching Cardiovascular: No chest pain, chest pressure or palpitations   Respiratory: No SOB or cough Gastrointestinal: See HPI and otherwise negative Genitourinary: No dysuria or change in urinary frequency Neurological: No headache, dizziness or syncope Musculoskeletal: No new muscle or joint pain Hematologic: No bleeding or bruising    Physical Exam:  Vital signs: There were no vitals taken for this visit.  Constitutional: NAD, Well developed, Well  nourished, alert and cooperative Head:  Normocephalic and atraumatic.  Eyes: No scleral icterus. Conjunctiva pink. Mouth: No oral lesions. Respiratory: Respirations even and unlabored. Lungs clear to auscultation bilaterally.  No wheezes, crackles, or rhonchi.  Cardiovascular:  Regular rate and rhythm. No murmurs. No peripheral edema. Gastrointestinal:  Soft, nondistended, nontender. No rebound or guarding. Normal bowel sounds. No appreciable masses or hepatomegaly. Rectal:  Not performed.  Neurologic:  Alert and oriented x4;  grossly normal neurologically.  Skin:   Dry and intact without significant lesions or rashes. Psychiatric: Oriented to person, place and time. Demonstrates good judgement and reason without abnormal affect or behaviors.   RELEVANT LABS AND IMAGING: CBC    Component Value Date/Time   WBC 6.8 11/07/2023 0746   RBC 4.83 11/07/2023 0746   HGB 15.2 (H) 11/07/2023 0746   HCT 45.4 11/07/2023 0746   PLT 235.0 11/07/2023 0746   MCV 93.9 11/07/2023 0746   MCH 30.3 04/20/2015 1555   MCHC 33.5 11/07/2023 0746   RDW 13.1 11/07/2023 0746   LYMPHSABS 2.3 12/24/2022 1036   MONOABS 0.4 12/24/2022 1036   EOSABS 0.1 12/24/2022 1036   BASOSABS 0.0 12/24/2022 1036    CMP     Component Value Date/Time   NA 142 11/07/2023 0746   NA 142 05/12/2017 1455   K 4.3 11/07/2023 0746   CL 104 11/07/2023 0746   CO2 32 11/07/2023 0746   GLUCOSE 99 11/07/2023 0746   BUN 17 11/07/2023 0746   BUN 14 05/12/2017 1455   CREATININE 0.77 11/07/2023 0746   CREATININE 0.86 05/31/2011 0835   CALCIUM 10.3 11/07/2023 0746   PROT 6.6 11/07/2023 0746   PROT 6.6 05/12/2017 1455   ALBUMIN 4.6 11/07/2023 0746   ALBUMIN 4.6 05/12/2017 1455   AST 12 11/07/2023 0746   ALT 12 11/07/2023 0746   ALKPHOS 57 11/07/2023 0746   BILITOT 0.5 11/07/2023 0746   BILITOT 0.4 05/12/2017 1455   GFRNONAA 72 05/12/2017 1455   GFRAA 83 05/12/2017 1455     Assessment/Plan:   Going back to famotidine 20 mg  twice daily, or Nexium  20/40 mg, whichever was effective previously If alarm features consider EGD    Camie Furbish, PA-C [facility] 11/30/2023, 7:39 PM  Patient Care Team: Catherine Charlies LABOR, DO as PCP - General (Family Medicine) [place], Heather, OD (Optometry) Pavlis, Rosaline Gillis, MD as Referring Physician (Dermatology) Zelphia Redbird, PT as Physical Therapist (Physical Therapy) Gloriann Chick, MD as Consulting Physician (Obstetrics and Gynecology) Onita Duos, MD as Consulting Physician (Neurology) Kerman Vina HERO, NP as Nurse Practitioner (Gastroenterology)

## 2023-12-01 ENCOUNTER — Ambulatory Visit: Admitting: Gastroenterology

## 2023-12-01 ENCOUNTER — Other Ambulatory Visit (HOSPITAL_COMMUNITY): Payer: Self-pay

## 2023-12-01 ENCOUNTER — Encounter: Payer: Self-pay | Admitting: Gastroenterology

## 2023-12-01 VITALS — BP 104/76 | HR 92 | Ht 65.25 in | Wt 156.2 lb

## 2023-12-01 DIAGNOSIS — Z860100 Personal history of colon polyps, unspecified: Secondary | ICD-10-CM

## 2023-12-01 DIAGNOSIS — K5909 Other constipation: Secondary | ICD-10-CM

## 2023-12-01 DIAGNOSIS — K219 Gastro-esophageal reflux disease without esophagitis: Secondary | ICD-10-CM

## 2023-12-01 DIAGNOSIS — R1030 Lower abdominal pain, unspecified: Secondary | ICD-10-CM | POA: Diagnosis not present

## 2023-12-01 DIAGNOSIS — R1010 Upper abdominal pain, unspecified: Secondary | ICD-10-CM

## 2023-12-01 MED ORDER — ESOMEPRAZOLE MAGNESIUM 40 MG PO CPDR
40.0000 mg | DELAYED_RELEASE_CAPSULE | Freq: Every day | ORAL | 3 refills | Status: DC
  Filled 2023-12-01 (×2): qty 30, 30d supply, fill #0
  Filled 2023-12-20: qty 90, 90d supply, fill #1

## 2023-12-01 NOTE — Patient Instructions (Addendum)
 _______________________________________________________  If your blood pressure at your visit was 140/90 or greater, please contact your primary care physician to follow up on this.  _______________________________________________________  If you are age 62 or older, your body mass index should be between 23-30. Your Body mass index is 25.8 kg/m. If this is out of the aforementioned range listed, please consider follow up with your Primary Care Provider.  If you are age 58 or younger, your body mass index should be between 19-25. Your Body mass index is 25.8 kg/m. If this is out of the aformentioned range listed, please consider follow up with your Primary Care Provider.   ________________________________________________________  The [facility] providers would like to encourage you to use MYCHART to communicate with providers for non-urgent requests or questions.  Due to long hold times on the telephone, sending your provider a message by [facility] may be a faster and more efficient way to get a response.  Please allow 48 business hours for a response.  Please remember that this is for non-urgent requests.  _______________________________________________________  [facility] is using a team-based approach to care.  Your team is made up of your doctor and two to three APPS. Our APPS (Nurse Practitioners and Physician Assistants) work with your physician to ensure care continuity for you. They are fully qualified to address your health concerns and develop a treatment plan. They communicate directly with your gastroenterologist to care for you. Seeing the Advanced Practice Practitioners on your physician's team can help you by facilitating care more promptly, often allowing for earlier appointments, access to diagnostic testing, procedures, and other specialty referrals.   We have sent the following medications to your pharmacy for you to pick up at your convenience:  START: Nexium  40mg   one tablet daily  CONTINUE: Miralax   Consider adding a fiber supplement.  Please send a MyChart message in 1 month with an update on how you are feeling.  Low-FODMAP Eating Plan  FODMAP stands for fermentable oligosaccharides, disaccharides, monosaccharides, and polyols. These are sugars that are hard for some people to digest. A low-FODMAP eating plan may help some people who have irritable bowel syndrome (IBS) and certain other bowel (intestinal) diseases to manage their symptoms. This meal plan can be complicated to follow. Work with a diet and nutrition specialist (dietitian) to make a low-FODMAP eating plan that is right for you. A dietitian can help make sure that you get enough nutrition from this diet. What are tips for following this plan? Reading food labels Check labels for hidden FODMAPs such as: High-fructose syrup. Honey. Agave. Natural fruit flavors. Onion or garlic powder. Choose low-FODMAP foods that contain 3-4 grams of fiber per serving. Check food labels for serving sizes. Eat only one serving at a time to make sure FODMAP levels stay low. Shopping Shop with a list of foods that are recommended on this diet and make a meal plan. Meal planning Follow a low-FODMAP eating plan for up to 6 weeks, or as told by your health care provider or dietitian. To follow the eating plan: Eliminate high-FODMAP foods from your diet completely. Choose only low-FODMAP foods to eat. You will do this for 2-6 weeks. Gradually reintroduce high-FODMAP foods into your diet one at a time. Most people should wait a few days before introducing the next new high-FODMAP food into their meal plan. Your dietitian can recommend how quickly you may reintroduce foods. Keep a daily record of what and how much you eat and drink. Make note of  any symptoms that you have after eating. Review your daily record with a dietitian regularly to identify which foods you can eat and which foods you should  avoid. General tips Drink enough fluid each day to keep your urine pale yellow. Avoid processed foods. These often have added sugar and may be high in FODMAPs. Avoid most dairy products, whole grains, and sweeteners. Work with a dietitian to make sure you get enough fiber in your diet. Avoid high FODMAP foods at meals to manage symptoms. Recommended foods Fruits Bananas, oranges, tangerines, lemons, limes, blueberries, raspberries, strawberries, grapes, cantaloupe, honeydew melon, kiwi, papaya, passion fruit, and pineapple. Limited amounts of dried cranberries, banana chips, and shredded coconut. Vegetables Eggplant, zucchini, cucumber, peppers, green beans, bean sprouts, lettuce, arugula, kale, Swiss chard, spinach, collard greens, bok choy, summer squash, potato, and tomato. Limited amounts of corn, carrot, and sweet potato. Green parts of scallions. Grains Gluten-free grains, such as rice, oats, buckwheat, quinoa, corn, polenta, and millet. Gluten-free pasta, bread, or cereal. Rice noodles. Corn tortillas. Meats and other proteins Unseasoned beef, pork, poultry, or fish. Eggs. [place]. Tofu (firm) and tempeh. Limited amounts of nuts and seeds, such as almonds, walnuts, [place] nuts, pecans, peanuts, nut butters, pumpkin seeds, chia seeds, and sunflower seeds. Dairy Lactose-free milk, yogurt, and kefir. Lactose-free cottage cheese and ice cream. Non-dairy milks, such as almond, coconut, hemp, and rice milk. Non-dairy yogurt. Limited amounts of goat cheese, brie, mozzarella, parmesan, swiss, and other hard cheeses. Fats and oils Butter-free spreads. Vegetable oils, such as olive, canola, and sunflower oil. Seasoning and other foods Artificial sweeteners with names that do not end in ol, such as aspartame, saccharine, and stevia. Maple syrup, white table sugar, raw sugar, brown sugar, and molasses. Mayonnaise, soy sauce, and tamari. Fresh basil, coriander, parsley, rosemary, and  thyme. Beverages Water and mineral water. Sugar-sweetened soft drinks. Small amounts of orange juice or cranberry juice. Black and green tea. Most dry wines. Coffee. The items listed above may not be a complete list of foods and beverages you can eat. Contact a dietitian for more information. Foods to avoid Fruits Fresh, dried, and juiced forms of apple, pear, watermelon, peach, plum, cherries, apricots, blackberries, boysenberries, figs, nectarines, and mango. Avocado. Vegetables Chicory root, artichoke, asparagus, cabbage, snow peas, Brussels sprouts, broccoli, sugar snap peas, mushrooms, celery, and cauliflower. Onions, garlic, leeks, and the white part of scallions. Grains Wheat, including kamut, durum, and semolina. Barley and bulgur. Couscous. Wheat-based cereals. Wheat noodles, bread, crackers, and pastries. Meats and other proteins Fried or fatty meat. Sausage. Cashews and pistachios. Soybeans, baked beans, black beans, chickpeas, kidney beans, fava beans, navy beans, lentils, black-eyed peas, and split peas. Dairy Milk, yogurt, ice cream, and soft cheese. Cream and sour cream. Milk-based sauces. Custard. Buttermilk. Soy milk. Seasoning and other foods Any sugar-free gum or candy. Foods that contain artificial sweeteners such as sorbitol, mannitol, isomalt, or xylitol. Foods that contain honey, high-fructose corn syrup, or agave. Bouillon, vegetable stock, beef stock, and chicken stock. Garlic and onion powder. Condiments made with onion, such as hummus, chutney, pickles, relish, salad dressing, and salsa. Tomato paste. Beverages Chicory-based drinks. Coffee substitutes. Chamomile tea. Fennel tea. Sweet or fortified wines such as port or sherry. Diet soft drinks made with isomalt, mannitol, maltitol, sorbitol, or xylitol. Apple, pear, and mango juice. Juices with high-fructose corn syrup. The items listed above may not be a complete list of foods and beverages you should avoid. Contact a  dietitian for more information. Summary FODMAP stands for  fermentable oligosaccharides, disaccharides, monosaccharides, and polyols. These are sugars that are hard for some people to digest. A low-FODMAP eating plan is a short-term diet that helps to ease symptoms of certain bowel diseases. The eating plan usually lasts up to 6 weeks. After that, high-FODMAP foods are reintroduced gradually and one at a time. This can help you find out which foods may be causing symptoms. A low-FODMAP eating plan can be complicated. It is best to work with a dietitian who has experience with this type of plan. This information is not intended to replace advice given to you by your health care provider. Make sure you discuss any questions you have with your health care provider. Document Revised: 01/19/2023 Document Reviewed: 01/19/2023 Elsevier Patient Education  2025 [employer].  Thank you for entrusting me with your care and choosing [facility].  Camie Furbish, PA-C

## 2023-12-08 ENCOUNTER — Ambulatory Visit: Admitting: Physician Assistant

## 2023-12-20 ENCOUNTER — Other Ambulatory Visit (HOSPITAL_COMMUNITY): Payer: Self-pay

## 2023-12-25 ENCOUNTER — Other Ambulatory Visit (HOSPITAL_COMMUNITY): Payer: Self-pay

## 2023-12-26 ENCOUNTER — Encounter: Payer: 59 | Admitting: Family Medicine

## 2024-01-05 ENCOUNTER — Telehealth: Payer: Self-pay | Admitting: Gastroenterology

## 2024-01-05 NOTE — Telephone Encounter (Signed)
 I received the following message from patient to my email:  Jennifer Sharp, I tried to reply to you in [place], unfortunately you aren't listed in my care team. I hope it's ok to send an email for my follow up with you.  I started taking the Nexum 40 on Oct. 14 following our visit. My symptoms have definitely improved.  Not having the throat burning.  Woken up only 2 times with stomach pain.  Have taken tums a few times when experiencing stomach discomfort.  Taking Miralax and having regular bowel movements.  Will continue to take the Nexum and plan to schedule an endoscopy and colonoscopy in early 2026.  Please advise any changes or recommendations. Thank you! Jennifer Jennifer Sharp  Please reach out to patient and let her know that I think it is appropriate to continue Nexium  since this has been helpful for her.  She is due for repeat colonoscopy 04/2024, and I think it is reasonable to repeat an EGD at that time as well.  Please make sure a recall is placed for EGD/colonoscopy for March. Thank you

## 2024-01-05 NOTE — Telephone Encounter (Signed)
The pt has been advised via My Chart and recall entered

## 2024-02-24 ENCOUNTER — Other Ambulatory Visit (HOSPITAL_COMMUNITY): Payer: Self-pay

## 2024-03-08 ENCOUNTER — Other Ambulatory Visit: Payer: Self-pay | Admitting: Gastroenterology

## 2024-03-09 ENCOUNTER — Other Ambulatory Visit: Payer: Self-pay

## 2024-03-09 ENCOUNTER — Other Ambulatory Visit (HOSPITAL_COMMUNITY): Payer: Self-pay

## 2024-03-09 MED ORDER — ESOMEPRAZOLE MAGNESIUM 40 MG PO CPDR
40.0000 mg | DELAYED_RELEASE_CAPSULE | Freq: Every day | ORAL | 3 refills | Status: DC
  Filled 2024-03-09: qty 30, 30d supply, fill #0

## 2024-03-10 ENCOUNTER — Other Ambulatory Visit (HOSPITAL_COMMUNITY): Payer: Self-pay

## 2024-03-10 ENCOUNTER — Telehealth: Payer: Self-pay | Admitting: Gastroenterology

## 2024-03-10 MED ORDER — ESOMEPRAZOLE MAGNESIUM 20 MG PO CPDR
20.0000 mg | DELAYED_RELEASE_CAPSULE | Freq: Every day | ORAL | 2 refills | Status: AC
  Filled 2024-03-10: qty 30, 30d supply, fill #0

## 2024-03-10 NOTE — Telephone Encounter (Signed)
 Called the patient.Patient is advised and agrees to this plan of care. No further questions at this time.

## 2024-03-10 NOTE — Telephone Encounter (Signed)
 Established a line of communication with the patient so she is able to contact us  through My Chart. Prescription for esomeprazole  40 mg canceled by contacting the pharmacy. New dosage sent for 20 mg strength. New appointment scheduled for the patient.

## 2024-03-10 NOTE — Telephone Encounter (Signed)
 Received the following message to my email from patient:  Good morning Camie, sorry about sending an email, you are not listed in mychart to send a message. My acid reflux symptoms have greatly improved with the nexum. However, I have been experiencing pain in both hips. Im doing exercises for hip bursitis. I also see joint pain could be related to the nexum. Im also continuing to have the lower abdominal pain very randomly. My gyn feels could be muscular. I also have mild bladder prolapse. I have an apt. Friday w/ urologist. This information is just to update you.  Should I continue with the Nexum 40 or should we start to decrease and transition to something else? I believe my pharmacy sent you a message yesterday to renew the Nexum prescription. My colonoscopy/endoscopy is due in March this year.   Please let me know your thoughts.  Thank you, Jennifer Sharp    Recommend patient decrease to Nexium  20 mg daily and see if this is enough to keep her symptoms controlled while any potential side effects.  Please send new prescription.  She also needs an office visit in the next couple months to follow-up on this and determine whether she needs a repeat upper endoscopy with her surveillance colonoscopy which she is due for in March.

## 2024-03-12 ENCOUNTER — Other Ambulatory Visit (HOSPITAL_COMMUNITY): Payer: Self-pay

## 2024-03-12 ENCOUNTER — Other Ambulatory Visit: Payer: Self-pay

## 2024-03-12 MED ORDER — ESTRADIOL 0.01 % VA CREA
TOPICAL_CREAM | VAGINAL | 3 refills | Status: AC
  Filled 2024-03-12: qty 42.5, 90d supply, fill #0

## 2024-03-19 ENCOUNTER — Other Ambulatory Visit (HOSPITAL_BASED_OUTPATIENT_CLINIC_OR_DEPARTMENT_OTHER): Payer: Self-pay

## 2024-03-19 MED ORDER — AMOXICILLIN 500 MG PO CAPS
500.0000 mg | ORAL_CAPSULE | Freq: Three times a day (TID) | ORAL | 0 refills | Status: AC
  Filled 2024-03-19: qty 21, 7d supply, fill #0

## 2024-03-19 MED ORDER — HYDROCODONE-ACETAMINOPHEN 5-325 MG PO TABS
1.0000 | ORAL_TABLET | ORAL | 0 refills | Status: AC | PRN
  Filled 2024-03-19: qty 5, 1d supply, fill #0

## 2024-04-13 ENCOUNTER — Ambulatory Visit: Admitting: Gastroenterology

## 2024-04-21 ENCOUNTER — Ambulatory Visit: Admitting: Gastroenterology

## 2024-11-11 ENCOUNTER — Encounter: Admitting: Family Medicine
